# Patient Record
Sex: Female | Born: 1970 | Race: Asian | Hispanic: No | Smoking: Never smoker
Health system: Southern US, Community
[De-identification: ages and names within clinical notes are randomized; demographics above are authoritative.]

## PROBLEM LIST (undated history)

## (undated) DIAGNOSIS — E079 Disorder of thyroid, unspecified: Secondary | ICD-10-CM

## (undated) DIAGNOSIS — B009 Herpesviral infection, unspecified: Secondary | ICD-10-CM

## (undated) DIAGNOSIS — B360 Pityriasis versicolor: Secondary | ICD-10-CM

## (undated) DIAGNOSIS — M545 Low back pain: Secondary | ICD-10-CM

## (undated) DIAGNOSIS — R51 Headache: Secondary | ICD-10-CM

## (undated) DIAGNOSIS — N6019 Diffuse cystic mastopathy of unspecified breast: Secondary | ICD-10-CM

## (undated) DIAGNOSIS — L743 Miliaria, unspecified: Secondary | ICD-10-CM

## (undated) DIAGNOSIS — L659 Nonscarring hair loss, unspecified: Secondary | ICD-10-CM

## (undated) DIAGNOSIS — E871 Hypo-osmolality and hyponatremia: Secondary | ICD-10-CM

## (undated) DIAGNOSIS — Z0000 Encounter for general adult medical examination without abnormal findings: Secondary | ICD-10-CM

## (undated) DIAGNOSIS — O021 Missed abortion: Secondary | ICD-10-CM

## (undated) DIAGNOSIS — N760 Acute vaginitis: Secondary | ICD-10-CM

## (undated) DIAGNOSIS — N946 Dysmenorrhea, unspecified: Secondary | ICD-10-CM

## (undated) DIAGNOSIS — N809 Endometriosis, unspecified: Secondary | ICD-10-CM

## (undated) DIAGNOSIS — D649 Anemia, unspecified: Secondary | ICD-10-CM

## (undated) DIAGNOSIS — B019 Varicella without complication: Secondary | ICD-10-CM

## (undated) DIAGNOSIS — T7840XA Allergy, unspecified, initial encounter: Secondary | ICD-10-CM

## (undated) DIAGNOSIS — R591 Generalized enlarged lymph nodes: Secondary | ICD-10-CM

## (undated) HISTORY — DX: Miliaria, unspecified: L74.3

## (undated) HISTORY — DX: Nonscarring hair loss, unspecified: L65.9

## (undated) HISTORY — DX: Anemia, unspecified: D64.9

## (undated) HISTORY — DX: Acute vaginitis: N76.0

## (undated) HISTORY — DX: Encounter for general adult medical examination without abnormal findings: Z00.00

## (undated) HISTORY — DX: Allergy, unspecified, initial encounter: T78.40XA

## (undated) HISTORY — DX: Endometriosis, unspecified: N80.9

## (undated) HISTORY — DX: Low back pain: M54.5

## (undated) HISTORY — DX: Pityriasis versicolor: B36.0

## (undated) HISTORY — DX: Dysmenorrhea, unspecified: N94.6

## (undated) HISTORY — DX: Disorder of thyroid, unspecified: E07.9

## (undated) HISTORY — DX: Herpesviral infection, unspecified: B00.9

## (undated) HISTORY — DX: Varicella without complication: B01.9

## (undated) HISTORY — DX: Diffuse cystic mastopathy of unspecified breast: N60.19

## (undated) HISTORY — DX: Hypo-osmolality and hyponatremia: E87.1

## (undated) HISTORY — DX: Generalized enlarged lymph nodes: R59.1

---

## 2008-09-01 LAB — HM COLONOSCOPY

## 2010-01-20 LAB — HM MAMMOGRAPHY

## 2011-07-22 ENCOUNTER — Encounter: Payer: Self-pay | Admitting: Family Medicine

## 2011-07-22 ENCOUNTER — Ambulatory Visit (INDEPENDENT_AMBULATORY_CARE_PROVIDER_SITE_OTHER): Payer: PRIVATE HEALTH INSURANCE | Admitting: Family Medicine

## 2011-07-22 ENCOUNTER — Other Ambulatory Visit: Payer: Self-pay | Admitting: Family Medicine

## 2011-07-22 DIAGNOSIS — R5383 Other fatigue: Secondary | ICD-10-CM | POA: Insufficient documentation

## 2011-07-22 DIAGNOSIS — R5381 Other malaise: Secondary | ICD-10-CM

## 2011-07-22 DIAGNOSIS — R1310 Dysphagia, unspecified: Secondary | ICD-10-CM | POA: Insufficient documentation

## 2011-07-22 DIAGNOSIS — H9209 Otalgia, unspecified ear: Secondary | ICD-10-CM

## 2011-07-22 DIAGNOSIS — E559 Vitamin D deficiency, unspecified: Secondary | ICD-10-CM | POA: Insufficient documentation

## 2011-07-22 DIAGNOSIS — N6019 Diffuse cystic mastopathy of unspecified breast: Secondary | ICD-10-CM

## 2011-07-22 DIAGNOSIS — E875 Hyperkalemia: Secondary | ICD-10-CM

## 2011-07-22 DIAGNOSIS — E079 Disorder of thyroid, unspecified: Secondary | ICD-10-CM

## 2011-07-22 DIAGNOSIS — R1013 Epigastric pain: Secondary | ICD-10-CM

## 2011-07-22 DIAGNOSIS — Z309 Encounter for contraceptive management, unspecified: Secondary | ICD-10-CM

## 2011-07-22 HISTORY — DX: Diffuse cystic mastopathy of unspecified breast: N60.19

## 2011-07-22 HISTORY — DX: Disorder of thyroid, unspecified: E07.9

## 2011-07-22 NOTE — Patient Instructions (Addendum)

## 2011-07-23 LAB — HEPATIC FUNCTION PANEL
ALT: 13 U/L (ref 0–35)
AST: 20 U/L (ref 0–37)
Albumin: 4.7 g/dL (ref 3.5–5.2)
Alkaline Phosphatase: 47 U/L (ref 39–117)
Bilirubin, Direct: 0.1 mg/dL (ref 0.0–0.3)
Indirect Bilirubin: 0.5 mg/dL (ref 0.0–0.9)
Total Bilirubin: 0.6 mg/dL (ref 0.3–1.2)
Total Protein: 7.8 g/dL (ref 6.0–8.3)

## 2011-07-23 LAB — T4, FREE: Free T4: 1.19 ng/dL (ref 0.80–1.80)

## 2011-07-23 LAB — BASIC METABOLIC PANEL
BUN: 16 mg/dL (ref 6–23)
CO2: 25 mEq/L (ref 19–32)
Calcium: 9.7 mg/dL (ref 8.4–10.5)
Chloride: 101 mEq/L (ref 96–112)
Creat: 0.68 mg/dL (ref 0.50–1.10)
Glucose, Bld: 91 mg/dL (ref 70–99)
Potassium: 4.7 mEq/L (ref 3.5–5.3)
Sodium: 137 mEq/L (ref 135–145)

## 2011-07-23 LAB — CBC
HCT: 42.5 % (ref 36.0–46.0)
Hemoglobin: 14.6 g/dL (ref 12.0–15.0)
MCH: 31.8 pg (ref 26.0–34.0)
MCHC: 34.4 g/dL (ref 30.0–36.0)
MCV: 92.6 fL (ref 78.0–100.0)
Platelets: 281 10*3/uL (ref 150–400)
RBC: 4.59 MIL/uL (ref 3.87–5.11)
RDW: 12.2 % (ref 11.5–15.5)
WBC: 8.2 10*3/uL (ref 4.0–10.5)

## 2011-07-23 LAB — TSH: TSH: 2.228 u[IU]/mL (ref 0.350–4.500)

## 2011-07-23 LAB — PHOSPHORUS: Phosphorus: 3.3 mg/dL (ref 2.3–4.6)

## 2011-07-25 LAB — H. PYLORI ANTIBODY, IGG: H Pylori IgG: 0.76 {ISR}

## 2011-07-26 ENCOUNTER — Encounter: Payer: Self-pay | Admitting: Family Medicine

## 2011-07-26 DIAGNOSIS — H9209 Otalgia, unspecified ear: Secondary | ICD-10-CM | POA: Insufficient documentation

## 2011-07-26 DIAGNOSIS — Z309 Encounter for contraceptive management, unspecified: Secondary | ICD-10-CM | POA: Insufficient documentation

## 2011-07-26 LAB — VITAMIN D 1,25 DIHYDROXY
Vitamin D 1, 25 (OH)2 Total: 60 pg/mL (ref 18–72)
Vitamin D2 1, 25 (OH)2: <8 pg/mL
Vitamin D3 1, 25 (OH)2: 60 pg/mL

## 2011-07-26 NOTE — Assessment & Plan Note (Signed)
Minimize K in diet and monitor

## 2011-07-26 NOTE — Assessment & Plan Note (Addendum)
Will repeat thyroid labs. Brings in old labs which showed a TSH of 5.87

## 2011-07-26 NOTE — Assessment & Plan Note (Signed)
Patient reports a long history of irregular MGMs, tender breasts and some discharge was previously followed across the [place] and in the [place]. Would like to establish care her in [place] with a breast center.

## 2011-07-26 NOTE — Progress Notes (Signed)
Jackie [name] [id number] 1970-12-27 07/26/2011      Progress Note New Patient  Subjective  Chief Complaint  Chief Complaint  Patient presents with  . Establish Care    new patient    HPI  Patient is a 40 year old female who is in today to establish care. She is originally from [place] but has been living in the [place] since 2007. She had smoked extensively since moving here. She lived in [place] and [place]. She believes she is now in [place] for the duration. She has multiple complaints. So long history of breast tenderness and discharge from her breasts. Had some fibrocystic changes and has been followed intermittently by various stressors. Is trying to get old records and establish care here. Notes persistent fatigue and emotional lability. Does note a previous TSH elevated at 5.87. Has been struggling with some dyspepsia and dysphagia. Some flatulence and gaseousness is noted. No bloody or tarry stool. She has tried low-dose Zantac over-the-counter but she is unclear of the dosing. Denies sore throat. Declines flu shot. Did have a Tdap in 2011. When her dysphagia and her dyspepsia that she has mild chest discomfort as well. She also notes she has edema diffusely and sensation of pins and needles into her hands right greater than left during certain parts of her menstrual cycles. She has had carpal tunnel syndrome on the right in the past. She was struggling with pelvic pain and it was found that her IUD was improperly lodged. That has improved since that was removed. No fevers, chills, palpitations, shortness of breath. She was having some ear pain was seen by ENT and they ultimately felt like TMJ was the cause.  Past Medical History  Diagnosis Date  . Vitamin D deficiency 2012  . Chicken pox 40 yrs old  . Vitamin D deficiency 07/22/2011  . Fatigue 07/22/2011  . Thyroid dysfunction 07/22/2011  . Hyperkalemia 07/22/2011  . Dysphagia 07/22/2011  . Anemia     during pregnancy   . Fibrocystic breast 07/22/2011  . Ear pain 07/26/2011  . Contraceptive management 07/26/2011    History reviewed. No pertinent past surgical history.  Family History  Problem Relation Age of Onset  . Diabetes Mother     type 2  . Stroke Mother   . Angina Mother   . Osteoporosis Mother   . Heart disease Mother     angina  . Heart attack Father   . Diabetes Father     type 2  . Heart disease Father 54    MI  . Obesity Sister   . Diabetes Sister   . Obesity Brother   . Diabetes Brother     type 2  . Aneurysm Maternal Grandmother   . Heart attack Maternal Grandfather   . Pneumonia Paternal Grandfather   . Hypertension Sister     induced  . Other Sister     anemic  . Anemia Sister   . Depression Sister     History   Social History  . Marital Status: Married    Spouse Name: N/A    Number of Children: N/A  . Years of Education: N/A   Occupational History  . Not on file.   Social History Main Topics  . Smoking status: Never Smoker   . Smokeless tobacco: Never Used  . Alcohol Use: Yes     1 to 2 glasses 5 times a week. but then the next week maybe nothing  . Drug Use: No  . Sexually  Active: Yes -- Female partner(s)   Other Topics Concern  . Not on file   Social History Narrative  . No narrative on file    No current outpatient prescriptions on file prior to visit.    No Known Allergies  Review of Systems  Review of Systems  Constitutional: Negative for fever, chills and malaise/fatigue.  HENT: Negative for hearing loss, nosebleeds, congestion and sore throat.   Eyes: Negative for discharge.  Respiratory: Negative for cough, sputum production, shortness of breath and wheezing.   Cardiovascular: Positive for chest pain. Negative for palpitations and leg swelling.  Gastrointestinal: Positive for heartburn. Negative for nausea, vomiting, abdominal pain, diarrhea, constipation and blood in stool.  Genitourinary: Negative for dysuria, urgency, frequency and  hematuria.  Musculoskeletal: Negative for myalgias, back pain and falls.  Skin: Negative for rash.  Neurological: Positive for sensory change. Negative for dizziness, tremors, focal weakness, loss of consciousness, weakness and headaches.       Right arm feels heavy at times  Endo/Heme/Allergies: Negative for polydipsia. Does not bruise/bleed easily.  Psychiatric/Behavioral: Negative for depression and suicidal ideas. The patient is not nervous/anxious and does not have insomnia.     Objective  BP 104/71  Pulse 75  Temp(Src) 97.9 F (36.6 C) (Oral)  Ht 5' 1.75" (1.568 m)  Wt 134 lb 1.9 oz (60.836 kg)  BMI 24.73 kg/m2  SpO2 95%  LMP 06/24/2011  Physical Exam  Physical Exam  Constitutional: She is oriented to person, place, and time and well-developed, well-nourished, and in no distress. No distress.  HENT:  Head: Normocephalic and atraumatic.  Right Ear: External ear normal.  Left Ear: External ear normal.  Nose: Nose normal.  Mouth/Throat: Oropharynx is clear and moist. No oropharyngeal exudate.  Eyes: Conjunctivae are normal. Pupils are equal, round, and reactive to light. Right eye exhibits no discharge. Left eye exhibits no discharge. No scleral icterus.  Neck: Normal range of motion. Neck supple. No thyromegaly present.  Cardiovascular: Normal rate, regular rhythm, normal heart sounds and intact distal pulses.   No murmur heard. Pulmonary/Chest: Effort normal and breath sounds normal. No respiratory distress. She has no wheezes. She has no rales.  Abdominal: Soft. Bowel sounds are normal. She exhibits no distension and no mass. There is no tenderness.  Musculoskeletal: Normal range of motion. She exhibits no edema and no tenderness.  Lymphadenopathy:    She has no cervical adenopathy.  Neurological: She is alert and oriented to person, place, and time. She has normal reflexes. No cranial nerve deficit. Coordination normal.  Skin: Skin is warm and dry. No rash noted. She  is not diaphoretic.  Psychiatric: Mood, memory and affect normal.       Assessment & Plan  Fibrocystic breast Patient reports a long history of irregular MGMs, tender breasts and some discharge was previously followed across the [place] and in the [place]. Would like to establish care her in [place] with a breast center.   Fatigue Agrees to fasting labs and we will request old records.   Ear pain Notes intermittent symptoms of left ear pain. Has been seen by ENT and hearing test was normal. They were suggesting she possibly had TMJ.  Thyroid dysfunction Will repeat thyroid labs. Brings in old labs which showed a TSH of 5.87  Hyperkalemia Minimize K in diet and monitor  Vitamin D deficiency Will monitor intermittently  Contraceptive management Previously used IUD but her last one in 2010 became imbedded wrong and had to be removed, they are  considering a vasectomy for her husband and if not she may need to tubal ligation.   Dysphagia Has been dealing with dyspepsia and has tried some OTC Zantac but is unsure of dosing tried, avoid offending foods and increase Zantac.

## 2011-07-26 NOTE — Assessment & Plan Note (Signed)
Has been dealing with dyspepsia and has tried some OTC Zantac but is unsure of dosing tried, avoid offending foods and increase Zantac.

## 2011-07-26 NOTE — Assessment & Plan Note (Signed)
Previously used IUD but her last one in 2010 became imbedded wrong and had to be removed, they are considering a vasectomy for her husband and if not she may need to tubal ligation.

## 2011-07-26 NOTE — Assessment & Plan Note (Signed)
Notes intermittent symptoms of left ear pain. Has been seen by ENT and hearing test was normal. They were suggesting she possibly had TMJ.

## 2011-07-26 NOTE — Assessment & Plan Note (Signed)
Will monitor intermittently

## 2011-07-26 NOTE — Assessment & Plan Note (Signed)
Agrees to fasting labs and we will request old records.

## 2011-08-01 ENCOUNTER — Telehealth: Payer: Self-pay

## 2011-08-01 NOTE — Telephone Encounter (Signed)
Pt called stating she would like a mammogram referral to [facility]? Pt also states she is getting chest palpitation? Discussed with md and an appt has been made for [facility] 08-03-11

## 2011-08-03 ENCOUNTER — Encounter: Payer: Self-pay | Admitting: Family Medicine

## 2011-08-03 ENCOUNTER — Ambulatory Visit (INDEPENDENT_AMBULATORY_CARE_PROVIDER_SITE_OTHER): Payer: PRIVATE HEALTH INSURANCE | Admitting: Family Medicine

## 2011-08-03 DIAGNOSIS — R0789 Other chest pain: Secondary | ICD-10-CM

## 2011-08-03 DIAGNOSIS — M542 Cervicalgia: Secondary | ICD-10-CM

## 2011-08-03 DIAGNOSIS — R0602 Shortness of breath: Secondary | ICD-10-CM

## 2011-08-03 DIAGNOSIS — N644 Mastodynia: Secondary | ICD-10-CM

## 2011-08-03 DIAGNOSIS — R002 Palpitations: Secondary | ICD-10-CM

## 2011-08-03 MED ORDER — CYCLOBENZAPRINE HCL 10 MG PO TABS: 10.0000 mg | ORAL_TABLET | Freq: Three times a day (TID) | ORAL | Status: DC | PRN

## 2011-08-03 NOTE — Assessment & Plan Note (Addendum)
Left breast pain and chest wall pain has been constant for about 2 weeks, has had a history of galactorrhea in the past but no recent increase. She is over due for a MGM even for screening purposes. Will proceed with MGM and consider [place] as needed. Has had to take a couple courses of Plan B recently. She notes the pain on the left is sometimes reproducible with palpation.

## 2011-08-03 NOTE — Patient Instructions (Signed)
Back Pain, Adult Low back pain is very common. About 1 in 5 people have back pain.The cause of low back pain is rarely dangerous. The pain often gets better over time.About half of people with a sudden onset of back pain feel better in just 2 weeks. About 8 in 10 people feel better by 6 weeks.  CAUSES Some common causes of back pain include:  Strain of the muscles or ligaments supporting the spine.   Wear and tear (degeneration) of the spinal discs.   Arthritis.   Direct injury to the back.  DIAGNOSIS Most of the time, the direct cause of low back pain is not known.However, back pain can be treated effectively even when the exact cause of the pain is unknown.Answering your caregiver's questions about your overall health and symptoms is one of the most accurate ways to make sure the cause of your pain is not dangerous. If your caregiver needs more information, he or she may order lab work or imaging tests (X-rays or MRIs).However, even if imaging tests show changes in your back, this usually does not require surgery. HOME CARE INSTRUCTIONS For many people, back pain returns.Since low back pain is rarely dangerous, it is often a condition that people can learn to manageon their own.   Remain active. It is stressful on the back to sit or stand in one place. Do not sit, drive, or stand in one place for more than 30 minutes at a time. Take short walks on level surfaces as soon as pain allows.Try to increase the length of time you walk each day.   Do not stay in bed.Resting more than 1 or 2 days can delay your recovery.   Do not avoid exercise or work.Your body is made to move.It is not dangerous to be active, even though your back may hurt.Your back will likely heal faster if you return to being active before your pain is gone.   Pay attention to your body when you bend and lift. Many people have less discomfortwhen lifting if they bend their knees, keep the load close to their  bodies,and avoid twisting. Often, the most comfortable positions are those that put less stress on your recovering back.   Find a comfortable position to sleep. Use a firm mattress and lie on your side with your knees slightly bent. If you lie on your back, put a pillow under your knees.   Only take over-the-counter or prescription medicines as directed by your caregiver. Over-the-counter medicines to reduce pain and inflammation are often the most helpful.Your caregiver may prescribe muscle relaxant drugs.These medicines help dull your pain so you can more quickly return to your normal activities and healthy exercise.   Put ice on the injured area.   Put ice in a plastic bag.   Place a towel between your skin and the bag.   Leave the ice on for 15 to 20 minutes, 3 to 4 times a day for the first 2 to 3 days. After that, ice and heat may be alternated to reduce pain and spasms.   Ask your caregiver about trying back exercises and gentle massage. This may be of some benefit.   Avoid feeling anxious or stressed.Stress increases muscle tension and can worsen back pain.It is important to recognize when you are anxious or stressed and learn ways to manage it.Exercise is a great option.  SEEK MEDICAL CARE IF:  You have pain that is not relieved with rest or medicine.   You have   pain that does not improve in 1 week.   You have new symptoms.   You are generally not feeling well.  SEEK IMMEDIATE MEDICAL CARE IF:   You have pain that radiates from your back into your legs.   You develop new bowel or bladder control problems.   You have unusual weakness or numbness in your arms or legs.   You develop nausea or vomiting.   You develop abdominal pain.   You feel faint.  Document Released: 09/05/2005 Document Revised: 05/18/2011 Document Reviewed: 01/24/2011 [facility] Patient Information 2012 [place], [place].Costochondritis Costochondritis (Tietze syndrome), or costochondral  separation, is a swelling and irritation (inflammation) of the tissue (cartilage) that connects your ribs with your breastbone (sternum). It may occur on its own (spontaneously), through damage caused by an accident (trauma), or simply from coughing or minor exercise. It may take up to 6 weeks to get better and longer if you are unable to be conservative in your activities. HOME CARE INSTRUCTIONS   Avoid exhausting physical activity. Try not to strain your ribs during normal activity. This would include any activities using chest, belly (abdominal), and side muscles, especially if heavy weights are used.   Use ice for 15 to 20 minutes per hour while awake for the first 2 days. Place the ice in a plastic bag, and place a towel between the bag of ice and your skin.   Only take over-the-counter or prescription medicines for pain, discomfort, or fever as directed by your caregiver.  SEEK IMMEDIATE MEDICAL CARE IF:   Your pain increases or you are very uncomfortable.   You have a fever.   You develop difficulty with your breathing.   You cough up blood.   You develop worse chest pains, shortness of breath, sweating, or vomiting.   You develop new, unexplained problems (symptoms).  MAKE SURE YOU:   Understand these instructions.   Will watch your condition.   Will get help right away if you are not doing well or get worse.  Document Released: 06/15/2005 Document Revised: 05/18/2011 Document Reviewed: 04/23/2008 [facility] Patient Information 2012 [place], [place].  Apply moist heat and attempt gentle stretching to the neck and shoulders each night. Rub down with Aspercreme and consider a massage or Chiropractor if pain persists

## 2011-08-05 ENCOUNTER — Encounter: Payer: Self-pay | Admitting: Family Medicine

## 2011-08-05 DIAGNOSIS — R002 Palpitations: Secondary | ICD-10-CM | POA: Insufficient documentation

## 2011-08-05 NOTE — Progress Notes (Signed)
Jackie [name] [id number] Jun 11, 1971 08/05/2011      Progress Note-Follow Up  Subjective  Chief Complaint  Chief Complaint  Patient presents with  . multiple issues    HPI  Patient is a 40 yo female in today with multiple c/o. She has along history of breast pain, discharge and fibrocystic breasts  Complaining of worsening pain in the left breast recently. She is also complaining of left anterior chest wall pain that is often reproducible with palpation. No fall or trauma. She has been under a great deal of stress and is also noting some left sided neck spasm/pain and left shoulder pain. Also notes worsening fatigue and SOB with exertion which is steadily worsening. No recent febrile illness, congestion, cough, syncope, GI or GU c/o  Past Medical History  Diagnosis Date  . Vitamin D deficiency 2012  . Chicken pox 40 yrs old  . Vitamin D deficiency 07/22/2011  . Fatigue 07/22/2011  . Thyroid dysfunction 07/22/2011  . Hyperkalemia 07/22/2011  . Dysphagia 07/22/2011  . Anemia     during pregnancy  . Fibrocystic breast 07/22/2011  . Ear pain 07/26/2011  . Contraceptive management 07/26/2011  . Breast pain 08/03/2011  . Neck pain, musculoskeletal 08/03/2011  . Palpitations 08/05/2011    History reviewed. No pertinent past surgical history.  Family History  Problem Relation Age of Onset  . Diabetes Mother     type 2  . Stroke Mother   . Angina Mother   . Osteoporosis Mother   . Heart disease Mother     angina  . Heart attack Father   . Diabetes Father     type 2  . Heart disease Father 42    MI  . Obesity Sister   . Diabetes Sister   . Obesity Brother   . Diabetes Brother     type 2  . Aneurysm Maternal Grandmother   . Heart attack Maternal Grandfather   . Pneumonia Paternal Grandfather   . Hypertension Sister     induced  . Other Sister     anemic  . Anemia Sister   . Depression Sister     History   Social History  . Marital Status: Married    Spouse Name:  N/A    Number of Children: N/A  . Years of Education: N/A   Occupational History  . Not on file.   Social History Main Topics  . Smoking status: Never Smoker   . Smokeless tobacco: Never Used  . Alcohol Use: Yes     1 to 2 glasses 5 times a week. but then the next week maybe nothing  . Drug Use: No  . Sexually Active: Yes -- Female partner(s)   Other Topics Concern  . Not on file   Social History Narrative  . No narrative on file    Current Outpatient Prescriptions on File Prior to Visit  Medication Sig Dispense Refill  . Cholecalciferol (VITAMIN D) 1000 UNITS capsule Take 2,500 Units by mouth daily.        . Multiple Vitamin (MULTIVITAMIN) tablet Take 1 tablet by mouth daily.          No Known Allergies  Review of Systems  Review of Systems  Constitutional: Negative for fever and malaise/fatigue.  HENT: Negative for congestion.   Eyes: Negative for discharge.  Respiratory: Positive for shortness of breath.   Cardiovascular: Positive for chest pain and palpitations. Negative for leg swelling.  Gastrointestinal: Negative for nausea, abdominal pain and diarrhea.  Genitourinary: Negative for dysuria.  Musculoskeletal: Negative for falls.  Skin: Negative for rash.  Neurological: Negative for loss of consciousness and headaches.  Endo/Heme/Allergies: Negative for polydipsia.  Psychiatric/Behavioral: Negative for depression and suicidal ideas. The patient is nervous/anxious. The patient does not have insomnia.     Objective  BP 119/81  Pulse 73  Temp(Src) 97.8 F (36.6 C) (Oral)  Ht 5' 1.75" (1.568 m)  Wt 136 lb (61.689 kg)  BMI 25.08 kg/m2  SpO2 98%  LMP 06/24/2011  Physical Exam  Physical Exam  Constitutional: She is oriented to person, place, and time and well-developed, well-nourished, and in no distress. No distress.  HENT:  Head: Normocephalic and atraumatic.  Right Ear: External ear normal.  Left Ear: External ear normal.  Nose: Nose normal.    Mouth/Throat: Oropharynx is clear and moist. No oropharyngeal exudate.  Eyes: Conjunctivae are normal. Pupils are equal, round, and reactive to light. Right eye exhibits no discharge. Left eye exhibits no discharge. No scleral icterus.  Neck: Normal range of motion. Neck supple. No thyromegaly present.  Cardiovascular: Normal rate, regular rhythm, normal heart sounds and intact distal pulses.   No murmur heard. Pulmonary/Chest: Effort normal and breath sounds normal. No respiratory distress. She has no wheezes. She has no rales.  Abdominal: Soft. Bowel sounds are normal. She exhibits no distension and no mass. There is no tenderness.  Genitourinary:       Breast exam, fibrocystic b/l but no concerning lesions palpated, no breast discharge or skin changes  Musculoskeletal: Normal range of motion. She exhibits no edema and no tenderness.  Lymphadenopathy:    She has no cervical adenopathy.  Neurological: She is alert and oriented to person, place, and time. She has normal reflexes. No cranial nerve deficit. Coordination normal.  Skin: Skin is warm and dry. No rash noted. She is not diaphoretic.  Psychiatric: Mood, memory and affect normal.    Lab Results  Component Value Date   TSH 2.228 07/22/2011   Lab Results  Component Value Date   WBC 8.2 07/22/2011   HGB 14.6 07/22/2011   HCT 42.5 07/22/2011   MCV 92.6 07/22/2011   PLT 281 07/22/2011   Lab Results  Component Value Date   CREATININE 0.68 07/22/2011   BUN 16 07/22/2011   NA 137 07/22/2011   K 4.7 07/22/2011   CL 101 07/22/2011   CO2 25 07/22/2011   Lab Results  Component Value Date   ALT 13 07/22/2011   AST 20 07/22/2011   ALKPHOS 47 07/22/2011   BILITOT 0.6 07/22/2011    Assessment & Plan  Breast pain Left breast pain and chest wall pain has been constant for about 2 weeks, has had a history of galactorrhea in the past but no recent increase. She is over due for a MGM even for screening purposes. Will proceed with MGM and  consider [place] as needed. Has had to take a couple courses of Plan B recently. She notes the pain on the left is sometimes reproducible with palpation.   Neck pain, musculoskeletal Neck pain and left upper anterior chest wall pain are present suggestive of musculoskeletal pain at times, unfortunately cannot fully rule out other causes. For the musculoskeletal component she is encouraged to apply moist heat and perform gentle stretching and she is given Cyclobenzaprine to use as needed   Palpitations Patient c/o intermittent episodes of palpitations, SOB and chest pain. We will proceed with 2d echo to further evaluate

## 2011-08-05 NOTE — Assessment & Plan Note (Signed)
Neck pain and left upper anterior chest wall pain are present suggestive of musculoskeletal pain at times, unfortunately cannot fully rule out other causes. For the musculoskeletal component she is encouraged to apply moist heat and perform gentle stretching and she is given Cyclobenzaprine to use as needed

## 2011-08-05 NOTE — Assessment & Plan Note (Signed)
Patient c/o intermittent episodes of palpitations, SOB and chest pain. We will proceed with 2d echo to further evaluate

## 2011-08-09 ENCOUNTER — Telehealth: Payer: Self-pay | Admitting: Family Medicine

## 2011-08-09 NOTE — Telephone Encounter (Signed)
Patient informed that being "sick" is not going to affect her labwork

## 2011-08-09 NOTE — Telephone Encounter (Signed)
Patient would like to come in for bloodwork tomorrow & wants to know if she is congested and a little "lightheaded" will that affect her results?

## 2011-08-15 ENCOUNTER — Other Ambulatory Visit (HOSPITAL_COMMUNITY): Payer: PRIVATE HEALTH INSURANCE | Admitting: Radiology

## 2011-08-16 ENCOUNTER — Other Ambulatory Visit (INDEPENDENT_AMBULATORY_CARE_PROVIDER_SITE_OTHER): Payer: PRIVATE HEALTH INSURANCE

## 2011-08-16 DIAGNOSIS — E785 Hyperlipidemia, unspecified: Secondary | ICD-10-CM

## 2011-08-16 LAB — LIPID PANEL
Cholesterol: 213 mg/dL — ABNORMAL HIGH (ref 0–200)
HDL: 83.2 mg/dL (ref >39.00)
Total CHOL/HDL Ratio: 3
Triglycerides: 103 mg/dL (ref 0.0–149.0)
VLDL: 20.6 mg/dL (ref 0.0–40.0)

## 2011-08-16 LAB — LDL CHOLESTEROL, DIRECT: Direct LDL: 119.4 mg/dL

## 2011-08-19 ENCOUNTER — Other Ambulatory Visit: Payer: Self-pay | Admitting: Family Medicine

## 2011-08-19 ENCOUNTER — Ambulatory Visit
Admission: RE | Admit: 2011-08-19 | Discharge: 2011-08-19 | Disposition: A | Discharge status: Home / Self Care | Payer: PRIVATE HEALTH INSURANCE | Source: Ambulatory Visit | Attending: Family Medicine | Admitting: Family Medicine

## 2011-08-19 DIAGNOSIS — N644 Mastodynia: Secondary | ICD-10-CM

## 2011-08-22 ENCOUNTER — Ambulatory Visit: Payer: PRIVATE HEALTH INSURANCE | Admitting: Family Medicine

## 2011-08-22 ENCOUNTER — Ambulatory Visit (HOSPITAL_COMMUNITY): Payer: No Typology Code available for payment source | Attending: Cardiology | Admitting: Radiology

## 2011-08-22 DIAGNOSIS — R0602 Shortness of breath: Secondary | ICD-10-CM

## 2011-08-22 DIAGNOSIS — R079 Chest pain, unspecified: Secondary | ICD-10-CM | POA: Insufficient documentation

## 2011-08-22 DIAGNOSIS — R002 Palpitations: Secondary | ICD-10-CM

## 2011-08-22 DIAGNOSIS — I079 Rheumatic tricuspid valve disease, unspecified: Secondary | ICD-10-CM | POA: Insufficient documentation

## 2011-08-22 DIAGNOSIS — I059 Rheumatic mitral valve disease, unspecified: Secondary | ICD-10-CM | POA: Insufficient documentation

## 2011-08-22 DIAGNOSIS — R0789 Other chest pain: Secondary | ICD-10-CM

## 2011-08-22 DIAGNOSIS — Z8249 Family history of ischemic heart disease and other diseases of the circulatory system: Secondary | ICD-10-CM | POA: Insufficient documentation

## 2011-08-24 ENCOUNTER — Ambulatory Visit (INDEPENDENT_AMBULATORY_CARE_PROVIDER_SITE_OTHER): Payer: PRIVATE HEALTH INSURANCE | Admitting: Family Medicine

## 2011-08-24 ENCOUNTER — Ambulatory Visit (HOSPITAL_BASED_OUTPATIENT_CLINIC_OR_DEPARTMENT_OTHER)
Admission: RE | Admit: 2011-08-24 | Discharge: 2011-08-24 | Disposition: A | Discharge status: Home / Self Care | Payer: No Typology Code available for payment source | Source: Ambulatory Visit | Attending: Family Medicine | Admitting: Family Medicine

## 2011-08-24 ENCOUNTER — Encounter: Payer: Self-pay | Admitting: Family Medicine

## 2011-08-24 DIAGNOSIS — N644 Mastodynia: Secondary | ICD-10-CM

## 2011-08-24 DIAGNOSIS — E785 Hyperlipidemia, unspecified: Secondary | ICD-10-CM

## 2011-08-24 DIAGNOSIS — R002 Palpitations: Secondary | ICD-10-CM

## 2011-08-24 DIAGNOSIS — R079 Chest pain, unspecified: Secondary | ICD-10-CM

## 2011-08-24 DIAGNOSIS — R091 Pleurisy: Secondary | ICD-10-CM

## 2011-08-24 DIAGNOSIS — R040 Epistaxis: Secondary | ICD-10-CM

## 2011-08-24 DIAGNOSIS — R0789 Other chest pain: Secondary | ICD-10-CM

## 2011-08-24 DIAGNOSIS — R05 Cough: Secondary | ICD-10-CM

## 2011-08-24 DIAGNOSIS — R059 Cough, unspecified: Secondary | ICD-10-CM | POA: Insufficient documentation

## 2011-08-24 MED ORDER — ASPIRIN EC 81 MG PO TBEC: 81.0000 mg | DELAYED_RELEASE_TABLET | Freq: Every day | ORAL | Status: DC

## 2011-08-24 MED ORDER — BACITRACIN ZINC 500 UNIT/GM EX OINT: TOPICAL_OINTMENT | CUTANEOUS | Status: DC

## 2011-08-24 MED ORDER — NITROGLYCERIN 0.4 MG SL SUBL: 0.4000 mg | SUBLINGUAL_TABLET | SUBLINGUAL | Status: DC | PRN

## 2011-08-24 MED ORDER — KRILL OIL PO CAPS: 1.0000 | ORAL_CAPSULE | Freq: Every day | ORAL | Status: DC

## 2011-08-24 NOTE — Patient Instructions (Signed)
Pleurisy Pleurisy is an inflammation and swelling of the lining of the lungs. It usually is the result of an underlying infection or other disease. Because of this inflammation, it hurts to breathe. It is aggravated by coughing or deep breathing. The primary goal in treating pleurisy is to diagnose and treat the condition that caused it.  HOME CARE INSTRUCTIONS   Only take over-the-counter or prescription medicines for pain, discomfort, or fever as directed by your caregiver.   If medications which kill germs (antibiotics) were prescribed, take the entire course. Even if you are feeling better, you need to take them.   Use a cool mist vaporizer to help loosen secretions. This is so the secretions can be coughed up more easily.  SEEK MEDICAL CARE IF:   Your pain is not controlled with medication or is increasing.   You have an increase inpus like (purulent) secretions brought up with coughing.  SEEK IMMEDIATE MEDICAL CARE IF:   You have blue or dark lips, fingernails, or toenails.   You begin coughing up blood.   You have increased difficulty breathing.   You have continuing pain unrelieved by medicine or lasting more than 1 week.   You have pain that radiates into your neck, arms, or jaw.   You develop increased shortness of breath or wheezing.   You develop a fever, rash, vomiting, fainting, or other serious complaints.  Document Released: 09/05/2005 Document Revised: 05/18/2011 Document Reviewed: 04/06/2007 [facility] Patient Information 2012 [place], [place].Bronchitis Bronchitis is the body's way of reacting to injury and/or infection (inflammation) of the bronchi. Bronchi are the air tubes that extend from the windpipe into the lungs. If the inflammation becomes severe, it may cause shortness of breath. CAUSES  Inflammation may be caused by:  A virus.   Germs (bacteria).   Dust.   Allergens.   Pollutants and many other irritants.  The cells lining the bronchial tree are  covered with tiny hairs (cilia). These constantly beat upward, away from the lungs, toward the mouth. This keeps the lungs free of pollutants. When these cells become too irritated and are unable to do their job, mucus begins to develop. This causes the characteristic cough of bronchitis. The cough clears the lungs when the cilia are unable to do their job. Without either of these protective mechanisms, the mucus would settle in the lungs. Then you would develop pneumonia. Smoking is a common cause of bronchitis and can contribute to pneumonia. Stopping this habit is the single most important thing you can do to help yourself. TREATMENT   Your caregiver may prescribe an antibiotic if the cough is caused by bacteria. Also, medicines that open up your airways make it easier to breathe. Your caregiver may also recommend or prescribe an expectorant. It will loosen the mucus to be coughed up. Only take over-the-counter or prescription medicines for pain, discomfort, or fever as directed by your caregiver.   Removing whatever causes the problem (smoking, for example) is critical to preventing the problem from getting worse.   Cough suppressants may be prescribed for relief of cough symptoms.   Inhaled medicines may be prescribed to help with symptoms now and to help prevent problems from returning.   For those with recurrent (chronic) bronchitis, there may be a need for steroid medicines.  SEEK IMMEDIATE MEDICAL CARE IF:   During treatment, you develop more pus-like mucus (purulent sputum).   You have a fever.   Your baby is older than 3 months with a rectal temperature  of 102 F (38.9 C) or higher.   Your baby is 40 months old or younger with a rectal temperature of 100.4 F (38 C) or higher.   You become progressively more ill.   You have increased difficulty breathing, wheezing, or shortness of breath.  It is necessary to seek immediate medical care if you are elderly or sick from any other  disease. MAKE SURE YOU:   Understand these instructions.   Will watch your condition.   Will get help right away if you are not doing well or get worse.  Document Released: 09/05/2005 Document Revised: 05/18/2011 Document Reviewed: 07/15/2008 [facility] Patient Information 2012 [place], [place].

## 2011-08-24 NOTE — Progress Notes (Signed)
Jackie [name] [id number] 1970-11-09 08/24/2011      Progress Note-Follow Up  Subjective  Chief Complaint  Chief Complaint  Patient presents with  . Follow-up    on Echo and mammogram results, nose bleeds, pain under breast    HPI  Patient is a 40 year old Caucasian female who is here today in followup of her echo results. These are reviewed with her. She continues to have palpitations although she says to a lesser degree. She notes her breast pain discharge or slightly improved as well. She is noting however over the last week she's had URI symptoms. Fatigue, malaise, myalgias, fevers, chills, congestion, anorexia and a slight sense of nausea or wooziness. Cough is often productive of initially green phlegm but now clear and she does note a mild headache. The URI symptoms are improving. Her rhinorrhea is now clear her cough congestion and anorexia are improving. Unf anterior chest wall pain however. It is worse with palpation and reproducible. She described it as sometimes sharp like a knife and other times a squeezing or weight on her chest sensation. The other symptoms are sometimes associated with these pains and other times notortunately she's complaining of  Past Medical History  Diagnosis Date  . Vitamin D deficiency 2012  . Chicken pox 39 yrs old  . Vitamin D deficiency 07/22/2011  . Fatigue 07/22/2011  . Thyroid dysfunction 07/22/2011  . Hyperkalemia 07/22/2011  . Dysphagia 07/22/2011  . Anemia     during pregnancy  . Fibrocystic breast 07/22/2011  . Ear pain 07/26/2011  . Contraceptive management 07/26/2011  . Breast pain 08/03/2011  . Neck pain, musculoskeletal 08/03/2011  . Palpitations 08/05/2011  . Chest pain, atypical 08/24/2011  . Epistaxis 08/24/2011    History reviewed. No pertinent past surgical history.  Family History  Problem Relation Age of Onset  . Diabetes Mother     type 2  . Stroke Mother   . Angina Mother   . Osteoporosis Mother   . Heart disease  Mother     angina  . Heart attack Father   . Diabetes Father     type 2  . Heart disease Father 19    MI  . Obesity Sister   . Diabetes Sister   . Obesity Brother   . Diabetes Brother     type 2  . Aneurysm Maternal Grandmother   . Heart attack Maternal Grandfather   . Pneumonia Paternal Grandfather   . Hypertension Sister     induced  . Other Sister     anemic  . Anemia Sister   . Depression Sister     History   Social History  . Marital Status: Married    Spouse Name: N/A    Number of Children: N/A  . Years of Education: N/A   Occupational History  . Not on file.   Social History Main Topics  . Smoking status: Never Smoker   . Smokeless tobacco: Never Used  . Alcohol Use: Yes     1 to 2 glasses 5 times a week. but then the next week maybe nothing  . Drug Use: No  . Sexually Active: Yes -- Female partner(s)   Other Topics Concern  . Not on file   Social History Narrative  . No narrative on file    Current Outpatient Prescriptions on File Prior to Visit  Medication Sig Dispense Refill  . Cholecalciferol (VITAMIN D) 1000 UNITS capsule Take 2,500 Units by mouth daily.        Marland Kitchen  Multiple Vitamin (MULTIVITAMIN) tablet Take 1 tablet by mouth daily.        . cyclobenzaprine (FLEXERIL) 10 MG tablet Take 1 tablet (10 mg total) by mouth every 8 (eight) hours as needed for muscle spasms.  30 tablet  1    No Known Allergies  Review of Systems  Review of Systems  Constitutional: Positive for fever, chills and malaise/fatigue.  HENT: Positive for congestion.   Eyes: Negative for discharge.  Respiratory: Positive for cough, hemoptysis and shortness of breath. Negative for wheezing.   Cardiovascular: Positive for chest pain and palpitations. Negative for leg swelling.  Gastrointestinal: Positive for nausea. Negative for abdominal pain and diarrhea.  Genitourinary: Negative for dysuria.  Musculoskeletal: Positive for myalgias. Negative for falls.  Skin: Negative for  rash.  Neurological: Positive for dizziness. Negative for seizures, loss of consciousness and headaches.  Endo/Heme/Allergies: Negative for polydipsia.  Psychiatric/Behavioral: Negative for depression and suicidal ideas. The patient is not nervous/anxious and does not have insomnia.     Objective  BP 109/74  Pulse 90  Temp(Src) 98.1 F (36.7 C) (Oral)  Ht 5' 1.75" (1.568 m)  Wt 133 lb 12.8 oz (60.691 kg)  BMI 24.67 kg/m2  SpO2 96%  LMP 08/18/2011  Physical Exam  Physical Exam  Constitutional: She is oriented to person, place, and time and well-developed, well-nourished, and in no distress. No distress.  HENT:  Head: Normocephalic and atraumatic.  Eyes: Conjunctivae are normal.  Neck: Neck supple. No thyromegaly present.  Cardiovascular: Normal rate, regular rhythm and normal heart sounds.   No murmur heard. Pulmonary/Chest: Effort normal and breath sounds normal. She has no wheezes.  Abdominal: She exhibits no distension and no mass.  Musculoskeletal: She exhibits no edema.  Lymphadenopathy:    She has no cervical adenopathy.  Neurological: She is alert and oriented to person, place, and time.  Skin: Skin is warm and dry. No rash noted. She is not diaphoretic.  Psychiatric: Memory, affect and judgment normal.    Lab Results  Component Value Date   TSH 2.228 07/22/2011   Lab Results  Component Value Date   WBC 8.2 07/22/2011   HGB 14.6 07/22/2011   HCT 42.5 07/22/2011   MCV 92.6 07/22/2011   PLT 281 07/22/2011   Lab Results  Component Value Date   CREATININE 0.68 07/22/2011   BUN 16 07/22/2011   NA 137 07/22/2011   K 4.7 07/22/2011   CL 101 07/22/2011   CO2 25 07/22/2011   Lab Results  Component Value Date   ALT 13 07/22/2011   AST 20 07/22/2011   ALKPHOS 47 07/22/2011   BILITOT 0.6 07/22/2011   Lab Results  Component Value Date   CHOL 213* 08/16/2011   Lab Results  Component Value Date   HDL 83.20 08/16/2011   No results found for this basename: LDLCALC    Lab Results  Component Value Date   TRIG 103.0 08/16/2011   Lab Results  Component Value Date   CHOLHDL 3 08/16/2011     Assessment & Plan  Epistaxis Try an   Chest pain, atypical She is anxious today complaining of some left-sided upper chest wall pain she describes as sometimes sharp and knife and other times a squeezing sensation. She reports over the last 48 hours having some episodes of feeling chest heaviness as well. She also has many other complaints some of which are consistent with concerns for cardiac but many of which are not. She feels woozy, nausea slightly not always  associated with the pain. Due to her persistent complaints and inability to have certainty regarding a cardiac cause. She is set up for cardiac consultation and possible further imaging. Chest x-ray is done today. She still doesn't want to listen and asked to take a baby aspirin until her evaluation is complete. EKG does not show any significant changes in  Breast pain Recent imaging was reassuring to patient and she reports the pain and discharge are improved.  Palpitations Still coming and going, less frequent. Avoid caffeine. Recent echo showed slight leaks in tricuspid and mitral valves only  Pleurisy Likely secondary to recent URI symptoms, URI symptoms resolving. Encouraged increased hydration, rest and Ibuprofen prn if symtpoms worsen or fevers develop she will notify our office for further evaluation

## 2011-08-24 NOTE — Assessment & Plan Note (Signed)
Try an

## 2011-08-29 ENCOUNTER — Encounter: Payer: Self-pay | Admitting: Family Medicine

## 2011-08-29 DIAGNOSIS — R091 Pleurisy: Secondary | ICD-10-CM | POA: Insufficient documentation

## 2011-08-29 NOTE — Assessment & Plan Note (Signed)
Recent imaging was reassuring to patient and she reports the pain and discharge are improved.

## 2011-08-29 NOTE — Assessment & Plan Note (Signed)
Likely secondary to recent URI symptoms, URI symptoms resolving. Encouraged increased hydration, rest and Ibuprofen prn if symtpoms worsen or fevers develop she will notify our office for further evaluation

## 2011-08-29 NOTE — Assessment & Plan Note (Addendum)
She is anxious today complaining of some left-sided upper chest wall pain she describes as sometimes sharp and knife and other times a squeezing sensation. She reports over the last 48 hours having some episodes of feeling chest heaviness as well. She also has many other complaints some of which are consistent with concerns for cardiac but many of which are not. She feels woozy, nausea slightly not always associated with the pain. Due to her persistent complaints and inability to have certainty regarding a cardiac cause. She is set up for cardiac consultation and possible further imaging. Chest x-ray is done today. She still doesn't want to listen and asked to take a baby aspirin until her evaluation is complete. EKG does not show any significant changes in

## 2011-08-29 NOTE — Assessment & Plan Note (Addendum)
Still coming and going, less frequent. Avoid caffeine. Recent echo showed slight leaks in tricuspid and mitral valves only

## 2011-09-01 ENCOUNTER — Ambulatory Visit (INDEPENDENT_AMBULATORY_CARE_PROVIDER_SITE_OTHER): Payer: PRIVATE HEALTH INSURANCE | Admitting: Cardiology

## 2011-09-01 ENCOUNTER — Encounter: Payer: Self-pay | Admitting: Cardiology

## 2011-09-01 DIAGNOSIS — R0789 Other chest pain: Secondary | ICD-10-CM

## 2011-09-01 DIAGNOSIS — E079 Disorder of thyroid, unspecified: Secondary | ICD-10-CM

## 2011-09-01 DIAGNOSIS — R079 Chest pain, unspecified: Secondary | ICD-10-CM

## 2011-09-01 NOTE — Assessment & Plan Note (Signed)
Symptoms atypical. They lasted approximately 5 seconds and resolved. She has had no symptoms recently. I doubt cardiac etiology. There is some tenderness to palpation in the chest area and symptoms could be musculoskeletal. We will not schedule further testing at this point. If her symptoms return we will consider an exercise treadmill. Patient in agreement.

## 2011-09-01 NOTE — Patient Instructions (Signed)
Your physician recommends that you schedule a follow-up appointment in: AS NEEDED  

## 2011-09-01 NOTE — Assessment & Plan Note (Signed)
Management per primary care. 

## 2011-09-01 NOTE — Progress Notes (Signed)
HPI: 40 yo female with no prior cardiac history for evaluation of chest pain. Chest xray 08/24/11 negative. Echo in Dec 2012 showed normal LV function with EF 50-55. TSH, Hgb and K in Nov 2012 normal. Patient states that in the spring and summer she had 2 episodes of chest pain. They were sudden in onset and substernal in location. They were described as a stabbing sensation followed by pressure. No associated symptoms. The pain was not pleuritic, positional or related to food. Duration 5 seconds and resolved. She has had no chest pain since then. In the spring she did have dyspnea on exertion but this has resolved. There is no orthopnea, PND, pedal edema or syncope. Because of the above we were asked to further evaluate.  Current Outpatient Prescriptions  Medication Sig Dispense Refill  . aspirin EC 81 MG tablet Take 1 tablet (81 mg total) by mouth daily.  30 tablet  2  . bacitracin ointment Apply small amount to b/l nares at bed x 7days  30 g  0  . Cholecalciferol (VITAMIN D) 1000 UNITS capsule Take 2,500 Units by mouth daily.        . cyclobenzaprine (FLEXERIL) 10 MG tablet Take 1 tablet (10 mg total) by mouth every 8 (eight) hours as needed for muscle spasms.  30 tablet  1  . Krill Oil CAPS Take 1 capsule by mouth daily.  30 capsule  3  . Multiple Vitamin (MULTIVITAMIN) tablet Take 1 tablet by mouth daily.        . nitroGLYCERIN (NITROSTAT) 0.4 MG SL tablet Place 1 tablet (0.4 mg total) under the tongue every 5 (five) minutes as needed for chest pain.  25 tablet  1    No Known Allergies  Past Medical History  Diagnosis Date  . Vitamin D deficiency 2012  . Chicken pox 40 yrs old  . Thyroid dysfunction 07/22/2011  . Anemia     during pregnancy  . Fibrocystic breast 07/22/2011  . Pleurisy 08/29/2011    No past surgical history on file.  History   Social History  . Marital Status: Married    Spouse Name: N/A    Number of Children: N/A  . Years of Education: N/A   Occupational  History  . Not on file.   Social History Main Topics  . Smoking status: Never Smoker   . Smokeless tobacco: Never Used  . Alcohol Use: Yes     1 to 2 glasses 5 times a week. but then the next week maybe nothing  . Drug Use: No  . Sexually Active: Yes -- Female partner(s)   Other Topics Concern  . Not on file   Social History Narrative  . No narrative on file    Family History  Problem Relation Age of Onset  . Diabetes Mother     type 2  . Stroke Mother   . Angina Mother   . Osteoporosis Mother   . Heart disease Mother     angina  . Heart attack Father   . Diabetes Father     type 2  . Heart disease Father 66    MI  . Obesity Sister   . Diabetes Sister   . Obesity Brother   . Diabetes Brother     type 2  . Aneurysm Maternal Grandmother   . Heart attack Maternal Grandfather   . Pneumonia Paternal Grandfather   . Hypertension Sister     induced  . Other Sister  anemic  . Anemia Sister   . Depression Sister     ROS: fatigue but no fevers or chills, productive cough, hemoptysis, dysphasia, odynophagia, melena, hematochezia, dysuria, hematuria, rash, seizure activity, orthopnea, PND, pedal edema, claudication. Remaining systems are negative.  Physical Exam: Height 5\' 2"  (1.575 m), weight 133 lb 12.8 oz (60.691 kg), last menstrual period 08/18/2011.  General:  Well developed/well nourished in NAD Skin warm/dry Patient not depressed No peripheral clubbing Back-normal HEENT-normal/normal eyelids Neck supple/normal carotid upstroke bilaterally; no bruits; no JVD; no thyromegaly chest - CTA/ normal expansion CV - RRR/normal S1 and S2; no murmurs, rubs or gallops;  PMI nondisplaced Abdomen -NT/ND, no HSM, no mass, + bowel sounds, no bruit 2+ femoral pulses, no bruits Ext-no edema, chords, 2+ DP Neuro-grossly nonfocal  ECG normal sinus rhythm at a rate of 73. Axis normal. No ST changes.

## 2011-10-20 ENCOUNTER — Encounter: Payer: Self-pay | Admitting: Family Medicine

## 2011-10-20 ENCOUNTER — Ambulatory Visit (INDEPENDENT_AMBULATORY_CARE_PROVIDER_SITE_OTHER): Payer: PRIVATE HEALTH INSURANCE | Admitting: Family Medicine

## 2011-10-20 DIAGNOSIS — N946 Dysmenorrhea, unspecified: Secondary | ICD-10-CM

## 2011-10-20 DIAGNOSIS — N83209 Unspecified ovarian cyst, unspecified side: Secondary | ICD-10-CM

## 2011-10-20 DIAGNOSIS — R140 Abdominal distension (gaseous): Secondary | ICD-10-CM

## 2011-10-20 DIAGNOSIS — N926 Irregular menstruation, unspecified: Secondary | ICD-10-CM

## 2011-10-20 HISTORY — DX: Dysmenorrhea, unspecified: N94.6

## 2011-10-20 NOTE — Assessment & Plan Note (Signed)
Patient in today concerned about ongoing menstrual irregularity, bloating and discomfort. She reports in the fall of 2012 she had a vaginal ultrasound which showed some right-sided ovarian cyst. It also showed that her IUD become implanted and inflamed on the left-hand side. At that time she been having left-sided abdominal pain the pain is essentially resolved. Unfortunately she's been having right-sided abdominal pain which is intermittent. She does intermittent bloating. She describes increased menstrual or irregularity. Sometimes passing clots sometimes passing frank red blood. Sometimes in small amounts in between periods and symmetric long extended painful periods. She is interested in a referral to share her options regarding treatment for her multiple concerns and further evaluation. This is arranged and we will see her in followup once this has been completed.

## 2011-10-20 NOTE — Patient Instructions (Signed)
Dysmenorrhea Menstrual pain is caused by the muscles of the uterus tightening (contracting) during a menstrual period. The muscles of the uterus contract due to the chemicals in the uterine lining. Primary dysmenorrhea is menstrual cramps that last a couple of days when you start having menstrual periods or soon after. This often begins after a teenager starts having her period. As a woman gets older or has a baby, the cramps will usually lesson or disappear. Secondary dysmenorrhea begins later in life, lasts longer, and the pain may be stronger than primary dysmenorrhea. The pain may start before the period and last a few days after the period. This type of dysmenorrhea is usually caused by an underlying problem such as:  The tissue lining the uterus grows outside of the uterus in other areas of the body (endometriosis).   The endometrial tissue, which normally lines the uterus, is found in or grows into the muscular walls of the uterus (adenomyosis).   The pelvic blood vessels are engorged with blood just before the menstrual period (pelvic congestive syndrome).   Overgrowth of cells in the lining of the uterus or cervix (polyps of the uterus or cervix).   Falling down of the uterus (prolapse) because of loose or stretched ligaments.   Depression.   Bladder problems, infection, or inflammation.   Problems with the intestine, a tumor, or irritable bowel syndrome.   Cancer of the female organs or bladder.   A severely tipped uterus.   A very tight opening or closed cervix.   Noncancerous tumors of the uterus (fibroids).   Pelvic inflammatory disease (PID).   Pelvic scarring (adhesions) from a previous surgery.   Ovarian cyst.   An intrauterine device (IUD) used for birth control.  CAUSES  The cause of menstrual pain is often unknown. SYMPTOMS   Cramping or throbbing pain in your lower abdomen.   Sometimes, a woman may also experience headaches.   Lower back pain.    Feeling sick to your stomach (nausea) or vomiting.   Diarrhea.   Sweating or dizziness.  DIAGNOSIS  A diagnosis is based on your history, symptoms, physical examination, diagnostic tests, or procedures. Diagnostic tests or procedures may include:  Blood tests.   An ultrasound.   An examination of the lining of the uterus (dilation and curettage, D&C).   An examination inside your abdomen or pelvis with a scope (laparoscopy).   X-rays.   CT Scan.   MRI.   An examination inside the bladder with a scope (cystoscopy).   An examination inside the intestine or stomach with a scope (colonoscopy, gastroscopy).  TREATMENT  Treatment depends on the cause of the dysmenorrhea. Treatment may include:  Pain medicine prescribed by your caregiver.   Birth control pills.   Hormone replacement therapy.   Nonsteroidal anti-inflammatory drugs (NSAIDs). These may help stop the production of prostaglandins.   An IUD with progesterone hormone in it.   Acupuncture.   Surgery to remove adhesions, endometriosis, ovarian cyst, or fibroids.   Removal of the uterus (hysterectomy).   Progesterone shots to stop the menstrual period.   Cutting the nerves on the sacrum that go to the female organs (presacral neurectomy).   Electric currant to the sacral nerves (sacral nerve stimulation).   Antidepressant medicine.   Psychiatric therapy, counseling, or group therapy.   Exercise and physical therapy.   Meditation and yoga therapy.  HOME CARE INSTRUCTIONS   Only take over-the-counter or prescription medicines for pain, discomfort, or fever as directed by your   caregiver.   Place a heating pad or hot water bottle on your lower back or abdomen. Do not sleep with the heating pad.   Use aerobic exercises, walking, swimming, biking, and other exercises to help lessen the cramping.   Massage to the lower back or abdomen may help.   Stop smoking.   Avoid alcohol and caffeine.   Yoga,  meditation, or acupuncture may help.  SEEK MEDICAL CARE IF:   The pain does not get better with medicine.   You have pain with sexual intercourse.  SEEK IMMEDIATE MEDICAL CARE IF:   Your pain increases and is not controlled with medicines.   You have a fever.   You develop nausea or vomiting with your period not controlled with medicine.   You have abnormal vaginal bleeding with your period.   You pass out.  MAKE SURE YOU:   Understand these instructions.   Will watch your condition.   Will get help right away if you are not doing well or get worse.  Document Released: 09/05/2005 Document Revised: 05/18/2011 Document Reviewed: 12/22/2008 [employer] Patient Information 2012 [employer]. 

## 2011-10-20 NOTE — Assessment & Plan Note (Signed)
Patient is reporting worsening discomfort and increased irregularity of menstrual cycles in the past 1-2 years. Also notes increased vaginal discharge and abdominal bloating. Was having some Left lower quadrant pain prior to having her IUD removed in the fall of 2012 but the left sided pain has improved greatly unfortunately has had increased right sided pain with significant intermittent abdominal bloating each month.

## 2011-10-20 NOTE — Progress Notes (Signed)
Patient ID: Jackie [name], female   DOB: 07/11/1971, 41 y.o.   MRN: [id number] Jackie [name] [id number] 1970/12/05 10/20/2011      Progress Note-Follow Up  Subjective  Chief Complaint  Chief Complaint  Patient presents with  . discuss birth control    HPI  Patient is a 41 year old female who is in today for evaluation of menstrual irregularity and abdominal bloating. she reports that in the fall of 2012 she had her IUD removed after it was found to be dislodged into the left. She was having left lower quadrant pain at that time. The left lower quadrant pain is now resolved. Unfortunately she is having right lower quadrant pain and notes that her vaginal ultrasound did show ovarian cysts on the right. She also notes her periods become much more irregular. She occasionally has frank red and other times passes large darker clots. Periods have become much less predictable. It can come every couple of weeks or take over a month. He can be short or long. She denies any other trauma pain but does note occasional blood streak on the tissue after a bowel movement as well. Denies any straining or obvious hemorrhoids. No other change in bowels such as melena or worsening constipation. No fevers, chills. She does note increased vaginal discharge intermittently throughout the month recently as well. No fevers, chills, dysuria. She offers no breast complaints at today's visit  Past Medical History  Diagnosis Date  . Vitamin d deficiency 2012  . Chicken pox 41 yrs old  . Thyroid dysfunction 07/22/2011  . Anemia     during pregnancy  . Fibrocystic breast 07/22/2011  . Pleurisy 08/29/2011  . Dysmenorrhea 10/20/2011    History reviewed. No pertinent past surgical history.  Family History  Problem Relation Age of Onset  . Diabetes Mother     type 2  . Stroke Mother   . Osteoporosis Mother   . Heart disease Mother     angina  . Heart attack Father     MI in his 14s  . Diabetes Father     type 2    . Obesity Sister   . Diabetes Sister   . Obesity Brother   . Diabetes Brother     type 2  . Aneurysm Maternal Grandmother   . Heart attack Maternal Grandfather   . Pneumonia Paternal Grandfather   . Hypertension Sister     induced  . Other Sister     anemic  . Anemia Sister   . Depression Sister     History   Social History  . Marital Status: Married    Spouse Name: N/A    Number of Children: 1  . Years of Education: N/A   Occupational History  . Not on file.   Social History Main Topics  . Smoking status: Never Smoker   . Smokeless tobacco: Never Used  . Alcohol Use: Yes     1 to 2 glasses 5 times a week. but then the next week maybe nothing  . Drug Use: No  . Sexually Active: Yes -- Female partner(s)   Other Topics Concern  . Not on file   Social History Narrative  . No narrative on file    Current Outpatient Prescriptions on File Prior to Visit  Medication Sig Dispense Refill  . bacitracin ointment Apply small amount to b/l nares at bed x 7days  30 g  0  . Cholecalciferol (VITAMIN D) 1000 UNITS capsule Take 2,500 Units by mouth daily.        Marland Kitchen  cyclobenzaprine (FLEXERIL) 10 MG tablet Take 1 tablet (10 mg total) by mouth every 8 (eight) hours as needed for muscle spasms.  30 tablet  1  . Multiple Vitamin (MULTIVITAMIN) tablet Take 1 tablet by mouth daily.        . nitroGLYCERIN (NITROSTAT) 0.4 MG SL tablet Place 1 tablet (0.4 mg total) under the tongue every 5 (five) minutes as needed for chest pain.  25 tablet  1    No Known Allergies  Review of Systems  Review of Systems  Constitutional: Negative for fever and malaise/fatigue.  HENT: Negative for congestion.   Eyes: Negative for discharge.  Respiratory: Negative for shortness of breath.   Cardiovascular: Negative for chest pain, palpitations and leg swelling.  Gastrointestinal: Positive for abdominal pain and blood in stool. Negative for nausea, diarrhea, constipation and melena.       Patient does  note having some occasional blood on the tissue after bowel movements, it is a small amount, bright red and resolves spontaneously  Genitourinary: Negative for dysuria.  Musculoskeletal: Negative for falls.  Skin: Negative for rash.  Neurological: Negative for loss of consciousness and headaches.  Endo/Heme/Allergies: Negative for polydipsia.  Psychiatric/Behavioral: Negative for depression and suicidal ideas. The patient is not nervous/anxious and does not have insomnia.     Objective  Ht 5' 1.75" (1.568 m)  Physical Exam  Physical Exam  Constitutional: She is oriented to person, place, and time and well-developed, well-nourished, and in no distress. No distress.  HENT:  Head: Normocephalic and atraumatic.  Eyes: Conjunctivae are normal.  Neck: Neck supple. No thyromegaly present.  Cardiovascular: Normal rate, regular rhythm and normal heart sounds.   No murmur heard. Pulmonary/Chest: Effort normal and breath sounds normal. She has no wheezes.  Abdominal: She exhibits no distension and no mass.  Musculoskeletal: She exhibits no edema.  Lymphadenopathy:    She has no cervical adenopathy.  Neurological: She is alert and oriented to person, place, and time.  Skin: Skin is warm and dry. No rash noted. She is not diaphoretic.  Psychiatric: Memory, affect and judgment normal.    Lab Results  Component Value Date   TSH 2.228 07/22/2011   Lab Results  Component Value Date   WBC 8.2 07/22/2011   HGB 14.6 07/22/2011   HCT 42.5 07/22/2011   MCV 92.6 07/22/2011   PLT 281 07/22/2011   Lab Results  Component Value Date   CREATININE 0.68 07/22/2011   BUN 16 07/22/2011   NA 137 07/22/2011   K 4.7 07/22/2011   CL 101 07/22/2011   CO2 25 07/22/2011   Lab Results  Component Value Date   ALT 13 07/22/2011   AST 20 07/22/2011   ALKPHOS 47 07/22/2011   BILITOT 0.6 07/22/2011   Lab Results  Component Value Date   CHOL 213* 08/16/2011   Lab Results  Component Value Date   HDL 83.20  08/16/2011   No results found for this basename: [facility]   Lab Results  Component Value Date   TRIG 103.0 08/16/2011   Lab Results  Component Value Date   CHOLHDL 3 08/16/2011     Assessment & Plan  Dysmenorrhea Patient is reporting worsening discomfort and increased irregularity of menstrual cycles in the past 1-2 years. Also notes increased vaginal discharge and abdominal bloating. Was having some Left lower quadrant pain prior to having her IUD removed in the fall of 2012 but the left sided pain has improved greatly unfortunately has had increased right sided pain  with significant intermittent abdominal bloating each month.  Ovarian cyst Patient in today concerned about ongoing menstrual irregularity, bloating and discomfort. She reports in the fall of 2012 she had a vaginal ultrasound which showed some right-sided ovarian cyst. It also showed that her IUD become implanted and inflamed on the left-hand side. At that time she been having left-sided abdominal pain the pain is essentially resolved. Unfortunately she's been having right-sided abdominal pain which is intermittent. She does intermittent bloating. She describes increased menstrual or irregularity. Sometimes passing clots sometimes passing frank red blood. Sometimes in small amounts in between periods and symmetric long extended painful periods. She is interested in a referral to share her options regarding treatment for her multiple concerns and further evaluation. This is arranged and we will see her in followup once this has been completed.

## 2011-12-22 ENCOUNTER — Encounter: Payer: Self-pay | Admitting: Family Medicine

## 2011-12-22 ENCOUNTER — Ambulatory Visit (INDEPENDENT_AMBULATORY_CARE_PROVIDER_SITE_OTHER): Payer: No Typology Code available for payment source | Admitting: Family Medicine

## 2011-12-22 VITALS — BP 105/71 | HR 64 | Ht 61.75 in | Wt 136.0 lb

## 2011-12-22 DIAGNOSIS — L309 Dermatitis, unspecified: Secondary | ICD-10-CM

## 2011-12-22 DIAGNOSIS — M79609 Pain in unspecified limb: Secondary | ICD-10-CM

## 2011-12-22 DIAGNOSIS — M25472 Effusion, left ankle: Secondary | ICD-10-CM | POA: Insufficient documentation

## 2011-12-22 DIAGNOSIS — L259 Unspecified contact dermatitis, unspecified cause: Secondary | ICD-10-CM

## 2011-12-22 DIAGNOSIS — M25473 Effusion, unspecified ankle: Secondary | ICD-10-CM

## 2011-12-22 DIAGNOSIS — R0789 Other chest pain: Secondary | ICD-10-CM

## 2011-12-22 DIAGNOSIS — M79669 Pain in unspecified lower leg: Secondary | ICD-10-CM

## 2011-12-22 DIAGNOSIS — F411 Generalized anxiety disorder: Secondary | ICD-10-CM

## 2011-12-22 DIAGNOSIS — F419 Anxiety disorder, unspecified: Secondary | ICD-10-CM

## 2011-12-22 LAB — CBC WITH DIFFERENTIAL/PLATELET
Basophils Absolute: 0 10*3/uL (ref 0.0–0.1)
Basophils Relative: 0 % (ref 0–1)
Eosinophils Absolute: 0 10*3/uL (ref 0.0–0.7)
Eosinophils Relative: 0 % (ref 0–5)
HCT: 40.4 % (ref 36.0–46.0)
Hemoglobin: 13.6 g/dL (ref 12.0–15.0)
Lymphocytes Relative: 31 % (ref 12–46)
Lymphs Abs: 2.8 10*3/uL (ref 0.7–4.0)
MCH: 31.6 pg (ref 26.0–34.0)
MCHC: 33.7 g/dL (ref 30.0–36.0)
MCV: 94 fL (ref 78.0–100.0)
Monocytes Absolute: 0.5 10*3/uL (ref 0.1–1.0)
Monocytes Relative: 6 % (ref 3–12)
Neutro Abs: 5.6 10*3/uL (ref 1.7–7.7)
Neutrophils Relative %: 63 % (ref 43–77)
Platelets: 237 10*3/uL (ref 150–400)
RBC: 4.3 MIL/uL (ref 3.87–5.11)
RDW: 11.9 % (ref 11.5–15.5)
WBC: 8.9 10*3/uL (ref 4.0–10.5)

## 2011-12-22 LAB — COMPREHENSIVE METABOLIC PANEL
ALT: 13 U/L (ref 0–35)
AST: 20 U/L (ref 0–37)
Albumin: 4.4 g/dL (ref 3.5–5.2)
Alkaline Phosphatase: 47 U/L (ref 39–117)
BUN: 12 mg/dL (ref 6–23)
CO2: 28 mEq/L (ref 19–32)
Calcium: 9.5 mg/dL (ref 8.4–10.5)
Chloride: 100 mEq/L (ref 96–112)
Creat: 0.53 mg/dL (ref 0.50–1.10)
Glucose, Bld: 86 mg/dL (ref 70–99)
Potassium: 4.4 mEq/L (ref 3.5–5.3)
Sodium: 136 mEq/L (ref 135–145)
Total Bilirubin: 0.4 mg/dL (ref 0.3–1.2)
Total Protein: 7.1 g/dL (ref 6.0–8.3)

## 2011-12-22 LAB — PROTIME-INR
INR: 0.91 (ref <1.50)
Prothrombin Time: 12.6 seconds (ref 11.6–15.2)

## 2011-12-22 LAB — D-DIMER, QUANTITATIVE (NOT AT ARMC): D-Dimer, Quant: 0.22 ug/mL-FEU (ref 0.00–0.48)

## 2011-12-22 MED ORDER — FLUTICASONE PROPIONATE 0.05 % EX CREA: TOPICAL_CREAM | Freq: Two times a day (BID) | CUTANEOUS | Status: DC

## 2011-12-22 NOTE — Assessment & Plan Note (Signed)
No evidence of any abnormality on exam today. Given hx of brief calf pain/lower leg "heaviness" feeling, and her atypical CP sx's, I feel it would be prudent to r/o venous thrombosis. Will check D-dimer stat, and if positive will set up LE venous doppler. With globally positive ROS, will check CBC and CMET today.

## 2011-12-22 NOTE — Progress Notes (Signed)
OFFICE VISIT  12/22/2011   CC:  Chief Complaint  Patient presents with  . Edema    left ankle     HPI:    Patient is a 41 y.o. [place] female patient of Dr. Abner Greenspan who presents for multiple complaints. Primary symptom is left ankle swelling on and off since about 2 days ago (swollen in evening, back to normal in morning).  No redness or pain, no injury.  No hx of similar ankle swelling on either side in the past. Yesterday she felt what she describes as left sided chest ache/tightness that was associated with a feeling of generalized fatigue, "suddenly very tired".  No palpitations, no diaphoresis, no SOB.  Today still feels mild left sided chest pressure, but ankle is back to normal again.  No recent fevers, no myalgias or arthralgias, no URI/ST/cough/HA, or diarrhea.    ROS:   Positive for: mild nausea x 2d, +anxiety/panic attacks, brief left hand tingling yesterday, brief left lower leg "heaviness" feeling yesterday.  Mild blurry vision, mainly in mornings lately.  +Itchy patch of rash on upper back that "flares" when she gets "out of sorts" about things.   +Long hx of DUB, but she thinks her LMP was 1 wk ago, +heavy as per her usual, with bad abd cramps. +Stresses lately: worried about her son, some marital disagreements, struggling to adjust to life in a new place. NEGATIVE FOR: prolonged immobility recently, recent surgery.  She does not take any OCP.  No vomiting, no abdominal pain.  PO intake is normal.  No polyuria, polydipsia, or polyphagia.  No urinary sx's. No thigh pains.  No depression.   Past Medical History  Diagnosis Date  . Vitamin d deficiency 2012  . Chicken pox 41 yrs old  . Thyroid dysfunction 07/22/2011  . Anemia     during pregnancy  . Fibrocystic breast 07/22/2011  . Pleurisy 08/29/2011  . Dysmenorrhea 10/20/2011  Atypical chest pain: Eval by Dr. Jens Som at [facility] cardiology 09/01/2011.  ECHO unremarkable, EKG normal.  No further testing recommended at  that time.  History reviewed. No pertinent past surgical history.  Outpatient Prescriptions Prior to Visit  Medication Sig Dispense Refill  . Cholecalciferol (VITAMIN D) 1000 UNITS capsule Take 2,500 Units by mouth daily.        . Multiple Vitamin (MULTIVITAMIN) tablet Take 1 tablet by mouth daily.        . nitroGLYCERIN (NITROSTAT) 0.4 MG SL tablet Place 1 tablet (0.4 mg total) under the tongue every 5 (five) minutes as needed for chest pain.  25 tablet  1  . bacitracin ointment Apply small amount to b/l nares at bed x 7days  30 g  0  . cyclobenzaprine (FLEXERIL) 10 MG tablet Take 1 tablet (10 mg total) by mouth every 8 (eight) hours as needed for muscle spasms.  30 tablet  1    No Known Allergies  ROS See HPI above  PE: Blood pressure 105/71, pulse 64, height 5' 1.75" (1.568 m), weight 136 lb (61.689 kg). Gen: Alert, well appearing.  Patient is oriented to person, place, time, and situation. Pleasant affect, lucid thought and speech. ENT:  Eyes: no injection, icteris, swelling, or exudate.  EOMI, PERRLA. Nose: no drainage or turbinate edema/swelling.  No injection or focal lesion.  Mouth: lips without lesion/swelling.  Oral mucosa pink and moist.  Dentition intact and without obvious caries or gingival swelling.  Oropharynx without erythema, exudate, or swelling.  Neck - No masses or thyromegaly or  limitation in range of motion CV: RRR, no m/r/g.   LUNGS: CTA bilat, nonlabored resps, good aeration in all lung fields. ABD: soft, NT, ND, BS normal.  No hepatospenomegaly or mass.  No bruits. EXT: no clubbing, cyanosis, or edema.   No calf or popliteal tenderness. Ankles symmetric and without any abnormality.  ROM normal.  Homan's sign negative. Neuro: CN 2-12 intact bilaterally, strength 5/5 in proximal and distal upper extremities and lower extremities bilaterally.  No sensory deficits.  No tremor.  No disdiadochokinesis.  No ataxia.  Upper extremity and lower extremity DTRs symmetric.   No pronator drift. SKIN: upper back with golf ball sized patch of pinkish hyperkeratotic papular rash.  No pustules or vesicles or erythema.  LABS:  None today  IMPRESSION AND PLAN:  Left ankle swelling No evidence of any abnormality on exam today. Given hx of brief calf pain/lower leg "heaviness" feeling, and her atypical CP sx's, I feel it would be prudent to r/o venous thrombosis. Will check D-dimer stat, and if positive will set up LE venous doppler. With globally positive ROS, will check CBC and CMET today.     Eczema Generic cutivate 0.05% bid prn rx'd today.  Anxiety disorder Definite chronic worrier + describes frequent mild panic attacks. Discussed possibility of needing med treatment for this and I encouraged her to take this issue up with Dr. Abner Greenspan in the near future.      FOLLOW UP: No Follow-up on file.

## 2011-12-22 NOTE — Assessment & Plan Note (Signed)
Generic cutivate 0.05% bid prn rx'd today.

## 2011-12-22 NOTE — Assessment & Plan Note (Signed)
Definite chronic worrier + describes frequent mild panic attacks. Discussed possibility of needing med treatment for this and I encouraged her to take this issue up with Dr. Abner Greenspan in the near future.

## 2011-12-23 MED ORDER — HYDROCORTISONE VALERATE 0.2 % EX CREA: TOPICAL_CREAM | CUTANEOUS | Status: DC

## 2011-12-23 NOTE — Progress Notes (Signed)
Addended by: Andrew Au on: 12/23/2011 08:35 AM   Modules accepted: Orders

## 2012-05-07 ENCOUNTER — Ambulatory Visit (INDEPENDENT_AMBULATORY_CARE_PROVIDER_SITE_OTHER): Payer: PRIVATE HEALTH INSURANCE | Admitting: Family Medicine

## 2012-05-07 ENCOUNTER — Encounter: Payer: Self-pay | Admitting: Family Medicine

## 2012-05-07 VITALS — BP 108/73 | HR 97 | Temp 97.8°F | Ht 61.75 in | Wt 130.8 lb

## 2012-05-07 DIAGNOSIS — F329 Major depressive disorder, single episode, unspecified: Secondary | ICD-10-CM

## 2012-05-07 DIAGNOSIS — R109 Unspecified abdominal pain: Secondary | ICD-10-CM

## 2012-05-07 DIAGNOSIS — M791 Myalgia, unspecified site: Secondary | ICD-10-CM

## 2012-05-07 DIAGNOSIS — IMO0001 Reserved for inherently not codable concepts without codable children: Secondary | ICD-10-CM

## 2012-05-07 DIAGNOSIS — R5381 Other malaise: Secondary | ICD-10-CM

## 2012-05-07 DIAGNOSIS — R197 Diarrhea, unspecified: Secondary | ICD-10-CM

## 2012-05-07 DIAGNOSIS — F32A Depression, unspecified: Secondary | ICD-10-CM

## 2012-05-07 DIAGNOSIS — R5383 Other fatigue: Secondary | ICD-10-CM

## 2012-05-07 LAB — CBC WITH DIFFERENTIAL/PLATELET
Basophils Absolute: 0 10*3/uL (ref 0.0–0.1)
Basophils Relative: 0.4 % (ref 0.0–3.0)
Eosinophils Absolute: 0 10*3/uL (ref 0.0–0.7)
Eosinophils Relative: 0.2 % (ref 0.0–5.0)
HCT: 43.4 % (ref 36.0–46.0)
Hemoglobin: 14.3 g/dL (ref 12.0–15.0)
Lymphocytes Relative: 31.3 % (ref 12.0–46.0)
Lymphs Abs: 2.3 10*3/uL (ref 0.7–4.0)
MCHC: 32.9 g/dL (ref 30.0–36.0)
MCV: 94.6 fl (ref 78.0–100.0)
Monocytes Absolute: 0.4 10*3/uL (ref 0.1–1.0)
Monocytes Relative: 5.4 % (ref 3.0–12.0)
Neutro Abs: 4.7 10*3/uL (ref 1.4–7.7)
Neutrophils Relative %: 62.7 % (ref 43.0–77.0)
Platelets: 251 10*3/uL (ref 150.0–400.0)
RBC: 4.59 Mil/uL (ref 3.87–5.11)
RDW: 11.9 % (ref 11.5–14.6)
WBC: 7.4 10*3/uL (ref 4.5–10.5)

## 2012-05-07 LAB — COMPREHENSIVE METABOLIC PANEL
ALT: 17 U/L (ref 0–35)
AST: 21 U/L (ref 0–37)
Albumin: 3.9 g/dL (ref 3.5–5.2)
Alkaline Phosphatase: 52 U/L (ref 39–117)
BUN: 15 mg/dL (ref 6–23)
CO2: 28 mEq/L (ref 19–32)
Calcium: 9.6 mg/dL (ref 8.4–10.5)
Chloride: 102 mEq/L (ref 96–112)
Creatinine, Ser: 0.6 mg/dL (ref 0.4–1.2)
GFR: 116.47 mL/min (ref >60.00)
Glucose, Bld: 71 mg/dL (ref 70–99)
Potassium: 4.6 mEq/L (ref 3.5–5.1)
Sodium: 138 mEq/L (ref 135–145)
Total Bilirubin: 0.5 mg/dL (ref 0.3–1.2)
Total Protein: 7.3 g/dL (ref 6.0–8.3)

## 2012-05-07 LAB — TSH: TSH: 2.2 u[IU]/mL (ref 0.35–5.50)

## 2012-05-07 LAB — SEDIMENTATION RATE: Sed Rate: 21 mm/hr (ref 0–22)

## 2012-05-07 NOTE — Assessment & Plan Note (Signed)
Acute-on-chronic, plus chronic myalgias diffusely. She may have a viral syndrome currently, but I strongly sense that her symptoms are somatic manifestations of her psychosocial/emotional turmoil that she is struggling with lately. We discussed this openly, and I recommended we do some routine testing to exclude some other problems, but I told her that treatment for depression/anxiety and/or fibromyalgia would be my next step if all labs came back normal (CBC, CMET, ESR, TSH, stool studies). She expressed understanding of the plan and was in agreement.  She wants to think it over, research things some, etc before committing to treatment with medicine of any kind right now.   I asked her to f/u in a few weeks with either Dr. Abner Greenspan or myself.
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
## 2012-05-08 NOTE — Progress Notes (Signed)
Quick Note:  Patient Informed and voiced understanding ______ 

## 2012-05-09 LAB — GIARDIA/CRYPTOSPORIDIUM (EIA)
Cryptosporidium Screen (EIA): NEGATIVE
Giardia Screen (EIA): NEGATIVE

## 2012-05-09 LAB — ROTAVIRUS ANTIGEN, STOOL: Rotavirus: NEGATIVE

## 2012-05-09 LAB — FECAL LACTOFERRIN, QUANT: Lactoferrin: NEGATIVE

## 2012-05-10 LAB — CLOSTRIDIUM DIFFICILE BY PCR: Toxigenic C. Difficile by PCR: NOT DETECTED

## 2012-05-12 LAB — STOOL CULTURE

## 2012-07-11 ENCOUNTER — Other Ambulatory Visit (HOSPITAL_COMMUNITY)
Admission: RE | Admit: 2012-07-11 | Discharge: 2012-07-11 | Disposition: A | Discharge status: Home / Self Care | Payer: No Typology Code available for payment source | Source: Ambulatory Visit | Attending: Family Medicine | Admitting: Family Medicine

## 2012-07-11 ENCOUNTER — Encounter: Payer: Self-pay | Admitting: Family Medicine

## 2012-07-11 ENCOUNTER — Ambulatory Visit (INDEPENDENT_AMBULATORY_CARE_PROVIDER_SITE_OTHER): Payer: PRIVATE HEALTH INSURANCE | Admitting: Family Medicine

## 2012-07-11 VITALS — BP 115/77 | HR 70 | Temp 99.0°F | Ht 61.75 in | Wt 133.4 lb

## 2012-07-11 DIAGNOSIS — Z202 Contact with and (suspected) exposure to infections with a predominantly sexual mode of transmission: Secondary | ICD-10-CM

## 2012-07-11 DIAGNOSIS — N760 Acute vaginitis: Secondary | ICD-10-CM

## 2012-07-11 DIAGNOSIS — F419 Anxiety disorder, unspecified: Secondary | ICD-10-CM

## 2012-07-11 DIAGNOSIS — Z9189 Other specified personal risk factors, not elsewhere classified: Secondary | ICD-10-CM

## 2012-07-11 DIAGNOSIS — Z113 Encounter for screening for infections with a predominantly sexual mode of transmission: Secondary | ICD-10-CM | POA: Insufficient documentation

## 2012-07-11 DIAGNOSIS — F411 Generalized anxiety disorder: Secondary | ICD-10-CM

## 2012-07-12 LAB — RPR

## 2012-07-12 LAB — HIV ANTIBODY (ROUTINE TESTING W REFLEX): HIV: NONREACTIVE

## 2012-07-12 NOTE — Progress Notes (Signed)
Quick Note:  Patient Informed and voiced understanding ______ 

## 2012-07-13 NOTE — Progress Notes (Signed)
Quick Note:  Patient Informed and voiced understanding ______ 

## 2012-07-15 ENCOUNTER — Encounter: Payer: Self-pay | Admitting: Family Medicine

## 2012-07-15 DIAGNOSIS — N760 Acute vaginitis: Secondary | ICD-10-CM

## 2012-07-15 HISTORY — DX: Acute vaginitis: N76.0

## 2012-07-15 NOTE — Progress Notes (Signed)
Patient ID: Jackie [name], female   DOB: 1971/01/18, 41 y.o.   MRN: [id number] Jackie [name] [id number] 06-12-1971 07/15/2012      Progress Note-Follow Up  Subjective  Chief Complaint  Chief Complaint  Patient presents with  . Follow-up    HPI  Patient is a 41 year old female who is in today for followup. She continues to struggle with high grade stress and anxiety. Acknowledges that her marriage has been somewhat rocky and she's not sure if there's Fidelity. She has had some mild vaginal discharge and is interested in having some testing done. No abdominal or back pain. No fevers or chills. No GI complaints. No dysuria, urgency frequency. No chest pain, palpitations, shortness of breath. Sheet knowledge is she has great deal of anxiety and acknowledges some low-grade depression but feels she's handling it relatively well at this time.  Past Medical History  Diagnosis Date  . Vitamin D deficiency 2012  . Chicken pox 41 yrs old  . Thyroid dysfunction 07/22/2011  . Anemia     during pregnancy  . Fibrocystic breast 07/22/2011  . Pleurisy 08/29/2011  . Dysmenorrhea 10/20/2011  . Vaginitis 07/15/2012    History reviewed. No pertinent past surgical history.  Family History  Problem Relation Age of Onset  . Diabetes Mother     type 2  . Stroke Mother   . Osteoporosis Mother   . Heart disease Mother     angina  . Heart attack Father     MI in his 68s  . Diabetes Father     type 2  . Obesity Sister   . Diabetes Sister   . Obesity Brother   . Diabetes Brother     type 2  . Aneurysm Maternal Grandmother   . Heart attack Maternal Grandfather   . Pneumonia Paternal Grandfather   . Hypertension Sister     induced  . Other Sister     anemic  . Anemia Sister   . Depression Sister     History   Social History  . Marital Status: Married    Spouse Name: N/A    Number of Children: 1  . Years of Education: N/A   Occupational History  . Not on file.   Social History  Main Topics  . Smoking status: Never Smoker   . Smokeless tobacco: Never Used  . Alcohol Use: Yes     1 to 2 glasses 5 times a week. but then the next week maybe nothing  . Drug Use: No  . Sexually Active: Yes -- Female partner(s)   Other Topics Concern  . Not on file   Social History Narrative  . No narrative on file    Current Outpatient Prescriptions on File Prior to Visit  Medication Sig Dispense Refill  . Multiple Vitamin (MULTIVITAMIN) tablet Take 1 tablet by mouth daily.        Marland Kitchen OVER THE COUNTER MEDICATION Emer[facility]      . Cholecalciferol (VITAMIN D) 1000 UNITS capsule Take 2,500 Units by mouth daily.          No Known Allergies  Review of Systems  Review of Systems  Constitutional: Negative for fever and malaise/fatigue.  HENT: Negative for congestion.   Eyes: Negative for discharge.  Respiratory: Negative for shortness of breath.   Cardiovascular: Negative for chest pain, palpitations and leg swelling.  Gastrointestinal: Negative for nausea, abdominal pain and diarrhea.  Genitourinary: Negative for dysuria.  Musculoskeletal: Negative for falls.  Skin: Negative for rash.  Neurological: Negative for loss of consciousness and headaches.  Endo/Heme/Allergies: Negative for polydipsia.  Psychiatric/Behavioral: Positive for depression. Negative for suicidal ideas. The patient is nervous/anxious. The patient does not have insomnia.     Objective  BP 115/77  Pulse 70  Temp 99 F (37.2 C) (Temporal)  Ht 5' 1.75" (1.568 m)  Wt 133 lb 6.4 oz (60.51 kg)  BMI 24.60 kg/m2  SpO2 98%  LMP 06/19/2012  Physical Exam  Physical Exam  Constitutional: She is oriented to person, place, and time and well-developed, well-nourished, and in no distress. No distress.  HENT:  Head: Normocephalic and atraumatic.  Eyes: Conjunctivae normal are normal.  Neck: Neck supple. No thyromegaly present.  Cardiovascular: Normal rate, regular rhythm and normal heart sounds.   No murmur  heard. Pulmonary/Chest: Effort normal and breath sounds normal. She has no wheezes.  Abdominal: She exhibits no distension and no mass.  Genitourinary: Uterus normal, cervix normal, right adnexa normal and left adnexa normal. Vaginal discharge found.  Musculoskeletal: She exhibits no edema.  Lymphadenopathy:    She has no cervical adenopathy.  Neurological: She is alert and oriented to person, place, and time.  Skin: Skin is warm and dry. No rash noted. She is not diaphoretic.  Psychiatric: Memory, affect and judgment normal.    Lab Results  Component Value Date   TSH 2.20 05/07/2012   Lab Results  Component Value Date   WBC 7.4 05/07/2012   HGB 14.3 05/07/2012   HCT 43.4 05/07/2012   MCV 94.6 05/07/2012   PLT 251.0 05/07/2012   Lab Results  Component Value Date   CREATININE 0.6 05/07/2012   BUN 15 05/07/2012   NA 138 05/07/2012   K 4.6 05/07/2012   CL 102 05/07/2012   CO2 28 05/07/2012   Lab Results  Component Value Date   ALT 17 05/07/2012   AST 21 05/07/2012   ALKPHOS 52 05/07/2012   BILITOT 0.5 05/07/2012   Lab Results  Component Value Date   CHOL 213* 08/16/2011   Lab Results  Component Value Date   HDL 83.20 08/16/2011   No results found for this basename: [facility]   Lab Results  Component Value Date   TRIG 103.0 08/16/2011   Lab Results  Component Value Date   CHOLHDL 3 08/16/2011     Assessment & Plan  Anxiety disorder Acknowledges this is a real concern for her but feels she does not need medications at the present time. Is struggling with a difficult marriage. Will let us know if she decides she needs meds. Is willing to try counseling is given contact info  Vaginitis Is unsure if her husband is being monogamous. Will check cultures today

## 2012-07-15 NOTE — Assessment & Plan Note (Signed)
Is unsure if her husband is being monogamous. Will check cultures today

## 2012-07-15 NOTE — Assessment & Plan Note (Signed)
Acknowledges this is a real concern for her but feels she does not need medications at the present time. Is struggling with a difficult marriage. Will let us know if she decides she needs meds. Is willing to try counseling is given contact info

## 2012-07-18 NOTE — Progress Notes (Signed)
Quick Note:  Patient Informed and voiced understanding ______ 

## 2012-08-23 ENCOUNTER — Other Ambulatory Visit: Payer: Self-pay | Admitting: Family Medicine

## 2012-08-23 DIAGNOSIS — Z1231 Encounter for screening mammogram for malignant neoplasm of breast: Secondary | ICD-10-CM

## 2012-10-01 ENCOUNTER — Ambulatory Visit: Payer: PRIVATE HEALTH INSURANCE

## 2012-10-02 ENCOUNTER — Ambulatory Visit
Admission: RE | Admit: 2012-10-02 | Discharge: 2012-10-02 | Disposition: A | Discharge status: Home / Self Care | Payer: PRIVATE HEALTH INSURANCE | Source: Ambulatory Visit | Attending: Family Medicine | Admitting: Family Medicine

## 2012-10-02 DIAGNOSIS — Z1231 Encounter for screening mammogram for malignant neoplasm of breast: Secondary | ICD-10-CM

## 2012-12-21 ENCOUNTER — Ambulatory Visit (INDEPENDENT_AMBULATORY_CARE_PROVIDER_SITE_OTHER): Payer: 59 | Admitting: Family Medicine

## 2012-12-21 ENCOUNTER — Encounter: Payer: Self-pay | Admitting: Family Medicine

## 2012-12-21 VITALS — BP 128/79 | HR 88 | Temp 99.0°F | Ht 61.75 in | Wt 136.4 lb

## 2012-12-21 DIAGNOSIS — R109 Unspecified abdominal pain: Secondary | ICD-10-CM

## 2012-12-21 DIAGNOSIS — R319 Hematuria, unspecified: Secondary | ICD-10-CM

## 2012-12-21 DIAGNOSIS — F411 Generalized anxiety disorder: Secondary | ICD-10-CM

## 2012-12-21 DIAGNOSIS — N809 Endometriosis, unspecified: Secondary | ICD-10-CM

## 2012-12-21 DIAGNOSIS — E871 Hypo-osmolality and hyponatremia: Secondary | ICD-10-CM

## 2012-12-21 DIAGNOSIS — F419 Anxiety disorder, unspecified: Secondary | ICD-10-CM

## 2012-12-21 LAB — POCT URINALYSIS DIPSTICK
Bilirubin, UA: NEGATIVE
Glucose, UA: NEGATIVE
Ketones, UA: NEGATIVE
Leukocytes, UA: NEGATIVE
Nitrite, UA: NEGATIVE
Protein, UA: NEGATIVE
Spec Grav, UA: 1.015
Urobilinogen, UA: 0.2
pH, UA: 5.5

## 2012-12-21 LAB — CBC
HCT: 38 % (ref 36.0–46.0)
Hemoglobin: 13 g/dL (ref 12.0–15.0)
MCHC: 34.1 g/dL (ref 30.0–36.0)
MCV: 93.3 fl (ref 78.0–100.0)
Platelets: 247 10*3/uL (ref 150.0–400.0)
RBC: 4.07 Mil/uL (ref 3.87–5.11)
RDW: 12.1 % (ref 11.5–14.6)
WBC: 9.4 10*3/uL (ref 4.5–10.5)

## 2012-12-21 LAB — TSH: TSH: 3.48 u[IU]/mL (ref 0.35–5.50)

## 2012-12-21 LAB — HEPATIC FUNCTION PANEL
ALT: 18 U/L (ref 0–35)
AST: 25 U/L (ref 0–37)
Albumin: 3.8 g/dL (ref 3.5–5.2)
Alkaline Phosphatase: 55 U/L (ref 39–117)
Bilirubin, Direct: 0.1 mg/dL (ref 0.0–0.3)
Total Bilirubin: 0.4 mg/dL (ref 0.3–1.2)
Total Protein: 7.1 g/dL (ref 6.0–8.3)

## 2012-12-21 LAB — RENAL FUNCTION PANEL
Albumin: 3.8 g/dL (ref 3.5–5.2)
BUN: 15 mg/dL (ref 6–23)
CO2: 28 mEq/L (ref 19–32)
Calcium: 9 mg/dL (ref 8.4–10.5)
Chloride: 99 mEq/L (ref 96–112)
Creatinine, Ser: 0.5 mg/dL (ref 0.4–1.2)
GFR: 140.08 mL/min (ref >60.00)
Glucose, Bld: 67 mg/dL — ABNORMAL LOW (ref 70–99)
Phosphorus: 4 mg/dL (ref 2.3–4.6)
Potassium: 3.8 mEq/L (ref 3.5–5.1)
Sodium: 133 mEq/L — ABNORMAL LOW (ref 135–145)

## 2012-12-21 NOTE — Patient Instructions (Addendum)
Start a probiotic such as Digestive Advantage by [employer]  DASH Diet The DASH diet stands for "Dietary Approaches to Stop Hypertension." It is a healthy eating plan that has been shown to reduce high blood pressure (hypertension) in as little as 14 days, while also possibly providing other significant health benefits. These other health benefits include reducing the risk of breast cancer after menopause and reducing the risk of type 2 diabetes, heart disease, colon cancer, and stroke. Health benefits also include weight loss and slowing kidney failure in patients with chronic kidney disease.  DIET GUIDELINES  Limit salt (sodium). Your diet should contain less than 1500 mg of sodium daily.  Limit refined or processed carbohydrates. Your diet should include mostly whole grains. Desserts and added sugars should be used sparingly.  Include small amounts of heart-healthy fats. These types of fats include nuts, oils, and tub margarine. Limit saturated and trans fats. These fats have been shown to be harmful in the body. CHOOSING FOODS  The following food groups are based on a 2000 calorie diet. See your Registered Dietitian for individual calorie needs. Grains and Grain Products (6 to 8 servings daily)  Eat More Often: Whole-wheat bread, brown rice, whole-grain or wheat pasta, quinoa, popcorn without added fat or salt (air popped).  Eat Less Often: White bread, white pasta, white rice, cornbread. Vegetables (4 to 5 servings daily)  Eat More Often: Fresh, frozen, and canned vegetables. Vegetables may be raw, steamed, roasted, or grilled with a minimal amount of fat.  Eat Less Often/Avoid: Creamed or fried vegetables. Vegetables in a cheese sauce. Fruit (4 to 5 servings daily)  Eat More Often: All fresh, canned (in natural juice), or frozen fruits. Dried fruits without added sugar. One hundred percent fruit juice ( cup [237 mL] daily).  Eat Less Often: Dried fruits with added sugar. Canned fruit in  light or heavy syrup. [employer], Fish, and Poultry (2 servings or less daily. One serving is 3 to 4 oz [85-114 g]).  Eat More Often: Ninety percent or leaner ground beef, tenderloin, sirloin. Round cuts of beef, chicken breast, [place] breast. All fish. Grill, bake, or broil your meat. Nothing should be fried.  Eat Less Often/Avoid: Fatty cuts of meat, [place], or chicken leg, thigh, or wing. Fried cuts of meat or fish. Dairy (2 to 3 servings)  Eat More Often: Low-fat or fat-free milk, low-fat plain or light yogurt, reduced-fat or part-skim cheese.  Eat Less Often/Avoid: Milk (whole, 2%).Whole milk yogurt. Full-fat cheeses. Nuts, Seeds, and Legumes (4 to 5 servings per week)  Eat More Often: All without added salt.  Eat Less Often/Avoid: Salted nuts and seeds, canned beans with added salt. Fats and Sweets (limited)  Eat More Often: Vegetable oils, tub margarines without trans fats, sugar-free gelatin. Mayonnaise and salad dressings.  Eat Less Often/Avoid: Coconut oils, palm oils, butter, stick margarine, cream, half and half, cookies, candy, pie. FOR MORE INFORMATION The Dash Diet Eating Plan: www.dashdiet.org Document Released: 08/25/2011 Document Revised: 11/28/2011 Document Reviewed: 08/25/2011 [facility] Patient Information 2013 [place], [place].

## 2012-12-22 ENCOUNTER — Encounter: Payer: Self-pay | Admitting: Family Medicine

## 2012-12-22 DIAGNOSIS — N809 Endometriosis, unspecified: Secondary | ICD-10-CM

## 2012-12-22 DIAGNOSIS — E871 Hypo-osmolality and hyponatremia: Secondary | ICD-10-CM | POA: Insufficient documentation

## 2012-12-22 HISTORY — DX: Hypo-osmolality and hyponatremia: E87.1

## 2012-12-22 HISTORY — DX: Endometriosis, unspecified: N80.9

## 2012-12-22 NOTE — Assessment & Plan Note (Signed)
Has not been seen by GYN for a while and is having increased abdominal bloating and discomfort in the lower regions. Is having intermittent episodes of sharp pains which sound suspicious for GI spasm within the bowels 6 Potential Scar Tissue. She's Encouraged to Start a Probiotic and Fiber Supplement. Increase Her Hydration and to Followup with GYN If Symptoms Are Persistent and GYN Is Unable to Find a Cause She Will Then Need Further Referral to GI and Further Consideration

## 2012-12-22 NOTE — Assessment & Plan Note (Signed)
Feels she's doing better in this regard for marital difficulties and began to calm down. Does not feel that her anxiety is contributing to her abdominal pain at this time.

## 2012-12-22 NOTE — Progress Notes (Signed)
Patient ID: Jackie [name], female   DOB: Oct 27, 1970, 42 y.o.   MRN: [id number] Jackie [name] [id number] March 12, 1971 12/22/2012      Progress Note-Follow Up  Subjective  Chief Complaint  Chief Complaint  Patient presents with  . gyn pain    HPI  Patient is a 42 year old female who is in today for evaluation of lower abdominal pain. In the past she's had trouble with bloating and discomfort in her lower abdomen and had been evaluated by GYN. They had noted the most likely cause with endometriosis. Patient reports symptoms have improved but are now slowly worsening. Her menses continue to occur but are slightly irregular at this time. She is describing a dull ache in her lower abdomen as well as her low back. Does have occasional episodes of sharper more fleeting pain off and on for her bowels move. No fevers or chills. No complaints of vaginal discharge or anorexia. No nausea, bloody stool or diarrhea. No chest pain or palpitations, shortness of breath.  Past Medical History  Diagnosis Date  . Vitamin D deficiency 2012  . Chicken pox 42 yrs old  . Thyroid dysfunction 07/22/2011  . Anemia     during pregnancy  . Fibrocystic breast 07/22/2011  . Pleurisy 08/29/2011  . Dysmenorrhea 10/20/2011  . Vaginitis 07/15/2012  . Endometriosis 12/22/2012  . Hyponatremia 12/22/2012    History reviewed. No pertinent past surgical history.  Family History  Problem Relation Age of Onset  . Diabetes Mother     type 2  . Stroke Mother   . Osteoporosis Mother   . Heart disease Mother     angina  . Heart attack Father     MI in his 20s  . Diabetes Father     type 2  . Obesity Sister   . Diabetes Sister   . Obesity Brother   . Diabetes Brother     type 2  . Aneurysm Maternal Grandmother   . Heart attack Maternal Grandfather   . Pneumonia Paternal Grandfather   . Hypertension Sister     induced  . Other Sister     anemic  . Anemia Sister   . Depression Sister     History   Social History   . Marital Status: Married    Spouse Name: N/A    Number of Children: 1  . Years of Education: N/A   Occupational History  . Not on file.   Social History Main Topics  . Smoking status: Never Smoker   . Smokeless tobacco: Never Used  . Alcohol Use: Yes     Comment: 1 to 2 glasses 5 times a week. but then the next week maybe nothing  . Drug Use: No  . Sexually Active: Yes -- Female partner(s)   Other Topics Concern  . Not on file   Social History Narrative  . No narrative on file    Current Outpatient Prescriptions on File Prior to Visit  Medication Sig Dispense Refill  . Cholecalciferol (VITAMIN D) 1000 UNITS capsule Take 2,500 Units by mouth daily.        . Multiple Vitamin (MULTIVITAMIN) tablet Take 1 tablet by mouth daily.        Marland Kitchen OVER THE COUNTER MEDICATION Emergen-C       No current facility-administered medications on file prior to visit.    No Known Allergies  Review of Systems  Review of Systems  Constitutional: Negative for fever and malaise/fatigue.  HENT: Negative for congestion.   Eyes:  Negative for discharge.  Respiratory: Negative for shortness of breath.   Cardiovascular: Negative for chest pain, palpitations and leg swelling.  Gastrointestinal: Positive for abdominal pain and constipation. Negative for nausea and diarrhea.  Genitourinary: Negative for dysuria.  Musculoskeletal: Negative for falls.  Skin: Negative for rash.  Neurological: Negative for loss of consciousness and headaches.  Endo/Heme/Allergies: Negative for polydipsia.  Psychiatric/Behavioral: Negative for depression and suicidal ideas. The patient is not nervous/anxious and does not have insomnia.     Objective  BP 128/79  Pulse 88  Temp(Src) 99 F (37.2 C) (Temporal)  Ht 5' 1.75" (1.568 m)  Wt 136 lb 6.4 oz (61.871 kg)  BMI 25.16 kg/m2  SpO2 98%  LMP 12/07/2012  Physical Exam  Physical Exam  Constitutional: She is oriented to person, place, and time and well-developed,  well-nourished, and in no distress. No distress.  HENT:  Head: Normocephalic and atraumatic.  Eyes: Conjunctivae are normal.  Neck: Neck supple. No thyromegaly present.  Cardiovascular: Normal rate, regular rhythm and normal heart sounds.   No murmur heard. Pulmonary/Chest: Effort normal and breath sounds normal. She has no wheezes.  Abdominal: She exhibits no distension and no mass.  Musculoskeletal: She exhibits no edema.  Lymphadenopathy:    She has no cervical adenopathy.  Neurological: She is alert and oriented to person, place, and time.  Skin: Skin is warm and dry. No rash noted. She is not diaphoretic.  Psychiatric: Memory, affect and judgment normal.    Lab Results  Component Value Date   TSH 3.48 12/21/2012   Lab Results  Component Value Date   WBC 9.4 12/21/2012   HGB 13.0 12/21/2012   HCT 38.0 12/21/2012   MCV 93.3 12/21/2012   PLT 247.0 12/21/2012   Lab Results  Component Value Date   CREATININE 0.5 12/21/2012   BUN 15 12/21/2012   NA 133* 12/21/2012   K 3.8 12/21/2012   CL 99 12/21/2012   CO2 28 12/21/2012   Lab Results  Component Value Date   ALT 18 12/21/2012   AST 25 12/21/2012   ALKPHOS 55 12/21/2012   BILITOT 0.4 12/21/2012   Lab Results  Component Value Date   CHOL 213* 08/16/2011   Lab Results  Component Value Date   HDL 83.20 08/16/2011   No results found for this basename: LDLCALC   Lab Results  Component Value Date   TRIG 103.0 08/16/2011   Lab Results  Component Value Date   CHOLHDL 3 08/16/2011     Assessment & Plan  Endometriosis Has not been seen by GYN for a while and is having increased abdominal bloating and discomfort in the lower regions. Is having intermittent episodes of sharp pains which sound suspicious for GI spasm within the bowels 6 Potential Scar Tissue. She's Encouraged to Start a Probiotic and Fiber Supplement. Increase Her Hydration and to Followup with GYN If Symptoms Are Persistent and GYN Is Unable to Find a Cause She Will Then Need  Further Referral to GI and Further Consideration  Anxiety disorder Feels she's doing better in this regard for marital difficulties and began to calm down. Does not feel that her anxiety is contributing to her abdominal pain at this time.  Hyponatremia Mild, decrease fluids slightly and recheck at next visit.

## 2012-12-22 NOTE — Assessment & Plan Note (Signed)
Mild, decrease fluids slightly and recheck at next visit.

## 2012-12-23 LAB — URINE CULTURE
Colony Count: NO GROWTH
Organism ID, Bacteria: NO GROWTH

## 2012-12-26 ENCOUNTER — Other Ambulatory Visit: Payer: Self-pay | Admitting: Emergency Medicine

## 2012-12-26 ENCOUNTER — Other Ambulatory Visit: Payer: 59

## 2012-12-26 ENCOUNTER — Other Ambulatory Visit (INDEPENDENT_AMBULATORY_CARE_PROVIDER_SITE_OTHER): Payer: 59

## 2012-12-26 DIAGNOSIS — N809 Endometriosis, unspecified: Secondary | ICD-10-CM

## 2012-12-26 DIAGNOSIS — R109 Unspecified abdominal pain: Secondary | ICD-10-CM

## 2012-12-26 LAB — FECAL OCCULT BLOOD, IMMUNOCHEMICAL: Fecal Occult Bld: NEGATIVE

## 2013-02-06 ENCOUNTER — Telehealth: Payer: Self-pay | Admitting: Family Medicine

## 2013-02-06 ENCOUNTER — Ambulatory Visit (INDEPENDENT_AMBULATORY_CARE_PROVIDER_SITE_OTHER): Payer: 59 | Admitting: Nurse Practitioner

## 2013-02-06 ENCOUNTER — Encounter: Payer: Self-pay | Admitting: Nurse Practitioner

## 2013-02-06 DIAGNOSIS — E875 Hyperkalemia: Secondary | ICD-10-CM

## 2013-02-06 DIAGNOSIS — T63461A Toxic effect of venom of wasps, accidental (unintentional), initial encounter: Secondary | ICD-10-CM

## 2013-02-06 DIAGNOSIS — E079 Disorder of thyroid, unspecified: Secondary | ICD-10-CM

## 2013-02-06 DIAGNOSIS — Z0000 Encounter for general adult medical examination without abnormal findings: Secondary | ICD-10-CM

## 2013-02-06 DIAGNOSIS — R21 Rash and other nonspecific skin eruption: Secondary | ICD-10-CM

## 2013-02-06 DIAGNOSIS — T6391XA Toxic effect of contact with unspecified venomous animal, accidental (unintentional), initial encounter: Secondary | ICD-10-CM

## 2013-02-06 NOTE — Telephone Encounter (Signed)
Order lipid, renal, cbc, tsh, hepatic, for annual and ana, sed rate and rf for rash

## 2013-02-06 NOTE — Progress Notes (Signed)
Subjective:    Patient ID: Jackie [name], female    DOB: 1970/12/03, 42 y.o.   MRN: [id number]  HPI Comments: Pt here because she was stung by wasp at L hand, web between thumb and index finger, by wasp, 4 hours ago. Pt concerned because hand is painful and she feel somewhat nauseated and has metallic taste in mouth.   Rash This is a recurrent (this rash has been recurring for years with sun & heat exposure) problem. The current episode started 1 to 4 weeks ago (papular rash on arms has been there for several weeks, macular lesionL elbow started 1 week ago). The problem has been gradually worsening since onset. The affected locations include the left elbow, left axilla, right arm and left arm. The rash is characterized by redness and itchiness (L elbow & axillae are flat, discrete and purplish. rash on arms is papular erythematous diffuse). Associated with: sun. Pt has developed rash from sun exposure since her 20's, more frequent episodes with living in geographical locations w/more intense sun. Pertinent negatives include no cough, eye pain, fatigue, fever or shortness of breath. (Has experienced hair loss, today reporting nausea and metallic taste in mouth) Treatments tried: sunscreen. The treatment provided mild relief.      Review of Systems  Constitutional: Negative for fever and fatigue.  HENT: Positive for facial swelling (reports feeling of swelling in mouth). Negative for drooling, mouth sores, trouble swallowing and voice change.   Eyes: Negative for pain and redness.  Respiratory: Positive for chest tightness (has had some chest tightness in last hour). Negative for cough, shortness of breath and wheezing.   Cardiovascular: Negative for chest pain and palpitations.  Gastrointestinal: Negative for abdominal pain.  Genitourinary: Positive for menstrual problem (heavy menstrual bleeding-gynecology treating for endometriosis).  Musculoskeletal: Positive for joint swelling (large great  toe bunions).  Skin: Positive for rash (see HPI for rash details) and wound (bee sting L hand).  Allergic/Immunologic: Negative for environmental allergies.  Neurological: Negative for headaches.  Psychiatric/Behavioral:       Tearful as discussed stress in marriage       Objective:   Physical Exam  Nursing note and vitals reviewed. Constitutional: She is oriented to person, place, and time. She appears well-developed and well-nourished. No distress (tearful when discussing marital concerns).  HENT:  Head: Normocephalic and atraumatic.  Mouth/Throat: Oropharynx is clear and moist.  Eyes: Conjunctivae are normal.  Neck: Normal range of motion. Neck supple. No tracheal deviation present. No thyromegaly present.  Cardiovascular: Normal rate, regular rhythm and normal heart sounds.   No murmur heard. Pulmonary/Chest: Effort normal and breath sounds normal. No stridor. No respiratory distress. She has no wheezes. She has no rales.  Musculoskeletal: Normal range of motion.  Large bunions noted bilateral great toe  Lymphadenopathy:    She has no cervical adenopathy.  Neurological: She is alert and oriented to person, place, and time.  Skin: Skin is warm and dry. Rash (L elbow & axillae are flat, discrete and purplish. rash on arms is 1 mm papular erythematous lesions, diffuse) noted.  Psychiatric: She has a normal mood and affect. Her behavior is normal. Thought content normal.  teary          Assessment & Plan:  1. Sting from hornet, wasp, or bee, initial encounter See pt instructions  2. Rash, skin I suspect this chronic recurrent rash related to sun exposure and the macular purplish lesion on L elbow may be related to autoimmune disease. Post sun-exposure  rashes can be a symptom of SLE. Pt declined screening labs today, would like to speak with PCP. I recommend ANA with titer, ESR, C3 & C4, with referral to rheumatology or dermatology for biopsy of macular lesion as it may be  discoid lupus. Pt does not want to use steroid cream.

## 2013-02-06 NOTE — Patient Instructions (Signed)
Ice your hand. Use ibuprophen for pain. Benadryl cream can be helpful for the itching. I do not see any stinger in your hand.  Until you see Dr. Abner Greenspan, please continue to stay out of the sun, use sunscreen with SPF of 30 or greater. I am concerned that your rash may be related to autoimmune disease and sun exposure can cause flares.

## 2013-02-06 NOTE — Telephone Encounter (Signed)
Please advise 

## 2013-02-07 ENCOUNTER — Telehealth: Payer: Self-pay | Admitting: *Deleted

## 2013-02-07 NOTE — Telephone Encounter (Signed)
Patient left message on voice mail wanting to know if she needs to go to [facility] for labs.

## 2013-02-07 NOTE — Telephone Encounter (Signed)
Labs ordered and detailed message left on answering machine

## 2013-02-08 NOTE — Telephone Encounter (Signed)
Called patient to let her know that she could have her labs done at either one. Patient scheduled lab appointment at [facility] on 02/28/13 at 9:45 am.

## 2013-02-19 LAB — HM PAP SMEAR: HM Pap smear: NORMAL

## 2013-02-21 ENCOUNTER — Telehealth: Payer: Self-pay

## 2013-02-21 DIAGNOSIS — L299 Pruritus, unspecified: Secondary | ICD-10-CM

## 2013-02-21 NOTE — Telephone Encounter (Signed)
Pt left a message stating that she saw the nurse practitioner previously for a rash. Pt stated that there are no red patches anymore but it itches inside her right elbow and between her shoulder blades. Pt would like to know if there is any labs she could add to her labwork this month or what she should do? Pts appt with MD is 03-07-13  Please advise?

## 2013-02-21 NOTE — Telephone Encounter (Signed)
Can add a sed rate, ANA to labs for pruritus

## 2013-02-22 NOTE — Telephone Encounter (Signed)
Pt informed

## 2013-02-28 ENCOUNTER — Other Ambulatory Visit: Payer: Self-pay | Admitting: Family Medicine

## 2013-02-28 ENCOUNTER — Other Ambulatory Visit (INDEPENDENT_AMBULATORY_CARE_PROVIDER_SITE_OTHER): Payer: 59

## 2013-02-28 DIAGNOSIS — L299 Pruritus, unspecified: Secondary | ICD-10-CM

## 2013-02-28 DIAGNOSIS — Z0000 Encounter for general adult medical examination without abnormal findings: Secondary | ICD-10-CM

## 2013-02-28 LAB — SEDIMENTATION RATE: Sed Rate: 4 mm/hr (ref 0–22)

## 2013-02-28 LAB — RENAL FUNCTION PANEL
Albumin: 4 g/dL (ref 3.5–5.2)
BUN: 11 mg/dL (ref 6–23)
CO2: 25 mEq/L (ref 19–32)
Calcium: 9.2 mg/dL (ref 8.4–10.5)
Chloride: 102 mEq/L (ref 96–112)
Creat: 0.65 mg/dL (ref 0.50–1.10)
Glucose, Bld: 82 mg/dL (ref 70–99)
Phosphorus: 3.4 mg/dL (ref 2.3–4.6)
Potassium: 4.2 mEq/L (ref 3.5–5.3)
Sodium: 136 mEq/L (ref 135–145)

## 2013-02-28 LAB — HEPATIC FUNCTION PANEL
ALT: 12 U/L (ref 0–35)
AST: 19 U/L (ref 0–37)
Albumin: 4 g/dL (ref 3.5–5.2)
Alkaline Phosphatase: 50 U/L (ref 39–117)
Bilirubin, Direct: 0.1 mg/dL (ref 0.0–0.3)
Indirect Bilirubin: 0.4 mg/dL (ref 0.0–0.9)
Total Bilirubin: 0.5 mg/dL (ref 0.3–1.2)
Total Protein: 6.8 g/dL (ref 6.0–8.3)

## 2013-02-28 LAB — LIPID PANEL
Cholesterol: 197 mg/dL (ref 0–200)
HDL: 86 mg/dL (ref >39)
LDL Cholesterol: 87 mg/dL (ref 0–99)
Total CHOL/HDL Ratio: 2.3 Ratio
Triglycerides: 122 mg/dL (ref <150)
VLDL: 24 mg/dL (ref 0–40)

## 2013-02-28 LAB — CBC
HCT: 39.3 % (ref 36.0–46.0)
Hemoglobin: 13.2 g/dL (ref 12.0–15.0)
MCH: 30.7 pg (ref 26.0–34.0)
MCHC: 33.6 g/dL (ref 30.0–36.0)
MCV: 91.4 fL (ref 78.0–100.0)
Platelets: 246 10*3/uL (ref 150–400)
RBC: 4.3 MIL/uL (ref 3.87–5.11)
RDW: 12.7 % (ref 11.5–15.5)
WBC: 6.4 10*3/uL (ref 4.0–10.5)

## 2013-02-28 LAB — TSH: TSH: 5.173 u[IU]/mL — ABNORMAL HIGH (ref 0.350–4.500)

## 2013-02-28 NOTE — Progress Notes (Signed)
Labs only

## 2013-02-28 NOTE — Addendum Note (Signed)
Addended by: Baldemar Lenis R on: 02/28/2013 10:13 AM   Modules accepted: Orders

## 2013-03-05 LAB — T4, FREE: Free T4: 1.13 ng/dL (ref 0.80–1.80)

## 2013-03-07 ENCOUNTER — Ambulatory Visit (INDEPENDENT_AMBULATORY_CARE_PROVIDER_SITE_OTHER): Payer: 59 | Admitting: Family Medicine

## 2013-03-07 ENCOUNTER — Encounter: Payer: Self-pay | Admitting: Family Medicine

## 2013-03-07 VITALS — BP 120/78 | HR 65 | Temp 98.3°F | Ht 61.75 in | Wt 136.1 lb

## 2013-03-07 DIAGNOSIS — R0789 Other chest pain: Secondary | ICD-10-CM

## 2013-03-07 DIAGNOSIS — F411 Generalized anxiety disorder: Secondary | ICD-10-CM

## 2013-03-07 DIAGNOSIS — E079 Disorder of thyroid, unspecified: Secondary | ICD-10-CM

## 2013-03-07 DIAGNOSIS — R5381 Other malaise: Secondary | ICD-10-CM

## 2013-03-07 DIAGNOSIS — N809 Endometriosis, unspecified: Secondary | ICD-10-CM

## 2013-03-07 DIAGNOSIS — F419 Anxiety disorder, unspecified: Secondary | ICD-10-CM

## 2013-03-07 DIAGNOSIS — Z0000 Encounter for general adult medical examination without abnormal findings: Secondary | ICD-10-CM | POA: Insufficient documentation

## 2013-03-07 DIAGNOSIS — E871 Hypo-osmolality and hyponatremia: Secondary | ICD-10-CM

## 2013-03-07 DIAGNOSIS — R5383 Other fatigue: Secondary | ICD-10-CM

## 2013-03-07 DIAGNOSIS — B360 Pityriasis versicolor: Secondary | ICD-10-CM

## 2013-03-07 DIAGNOSIS — R21 Rash and other nonspecific skin eruption: Secondary | ICD-10-CM

## 2013-03-07 HISTORY — DX: Encounter for general adult medical examination without abnormal findings: Z00.00

## 2013-03-07 HISTORY — DX: Pityriasis versicolor: B36.0

## 2013-03-07 MED ORDER — KETOCONAZOLE 2 % EX SHAM: MEDICATED_SHAMPOO | CUTANEOUS | Status: DC

## 2013-03-07 MED ORDER — DIAZEPAM 5 MG PO TABS: ORAL_TABLET | ORAL | Status: DC

## 2013-03-07 MED ORDER — CETIRIZINE HCL 10 MG PO TABS: 10.0000 mg | ORAL_TABLET | Freq: Every day | ORAL | Status: DC | PRN

## 2013-03-07 MED ORDER — LORATADINE 10 MG PO TABS: ORAL_TABLET | ORAL | Status: DC

## 2013-03-07 NOTE — Assessment & Plan Note (Signed)
Had a recent GYN exam and they are considering surgery but patient is hesitant.

## 2013-03-07 NOTE — Patient Instructions (Addendum)
TSH and freeT4 in 3-4 months.  Try a Diazepam for the shooting pains. Try Zantac 150 tab once or twice daily if atypical chest pains persist Mylanta is also helpful  Tinea Versicolor Tinea versicolor is a common yeast infection of the skin. This condition becomes known when the yeast on our skin starts to overgrow (yeast is a normal inhabitant on our skin). This condition is noticed as white or light brown patches on brown skin, and is more evident in the summer on tanned skin. These areas are slightly scaly if scratched. The light patches from the yeast become evident when the yeast creates "holes in your suntan". This is most often noticed in the summer. The patches are usually located on the chest, back, pubis, neck and body folds. However, it may occur on any area of body. Mild itching and inflammation (redness or soreness) may be present. DIAGNOSIS  The diagnosisof this is made clinically (by looking). Cultures from samples are usually not needed. Examination under the microscope may help. However, yeast is normally found on skin. The diagnosis still remains clinical. Examination under Wood's Ultraviolet Light can determine the extent of the infection. TREATMENT  This common infection is usually only of cosmetic (only a concern to your appearance). It is easily treated with dandruff shampoo used during showers or bathing. Vigorous scrubbing will eliminate the yeast over several days time. The light areas in your skin may remain for weeks or months after the infection is cured unless your skin is exposed to sunlight. The lighter or darker spots caused by the fungus that remain after complete treatment are not a sign of treatment failure; it will take a long time to resolve. Your caregiver may recommend a number of commercial preparations or medication by mouth if home care is not working. Recurrence is common and preventative medication may be necessary. This skin condition is not highly contagious.  Special care is not needed to protect close friends and family members. Normal hygiene is usually enough. Follow up is required only if you develop complications (such as a secondary infection from scratching), if recommended by your caregiver, or if no relief is obtained from the preparations used. Document Released: 09/02/2000 Document Revised: 11/28/2011 Document Reviewed: 10/15/2008 [facility] Patient Information 2014 [place], [place].

## 2013-03-07 NOTE — Assessment & Plan Note (Signed)
Resolved on recent labs.

## 2013-03-07 NOTE — Assessment & Plan Note (Signed)
tsh up but free T4 is normal. Recheck in 3 months time

## 2013-03-07 NOTE — Assessment & Plan Note (Signed)
Sharp, intermittent pains, unlikely cardiac. Concerning for silent reflux vs musculoskeletal. If pain persists try Zantac daily and Mylanta as needed. Notify us if symptoms worsen or persist

## 2013-03-07 NOTE — Assessment & Plan Note (Signed)
Discussed need for stress reduction, adequate sleep, heart healthy diet and regular exercise. Annual labs reviewed at visit.

## 2013-03-07 NOTE — Assessment & Plan Note (Signed)
Discussed anxiety at length. Patient declines daily SSRI, agrees to referral for counseling again given contact information. Did not follow through last time, does agree to take a prescription for Diazepam to use only in urgent situations for CP, panic, insomnia, etc

## 2013-03-07 NOTE — Progress Notes (Signed)
Patient ID: Jackie [name], female   DOB: 05-19-1971, 42 y.o.   MRN: [id number] Jackie [name] [id number] 09/03/1971 03/07/2013      Progress Note-Follow Up  Subjective  Chief Complaint  Chief Complaint  Patient presents with  . Annual Exam    HPI  Patient is a 42 year old female in today for annual exam. She is having numerous concerns. She's been following with OB/GYN and had a likely diagnosis of endometriosis. She has had a normal Pap recently. They are considering surgical intervention. Her largest complaint is of stress and skin difficulties. She now just when she gets overly stressed and hot her skin lesions are worse. Did become more pruritic red and pronounced. She has patches over her elbows and on her thighs as well as elsewhere that are discolored and shiny. They are pruritic intermittently. They're not raised. She did recently have a long standing but has recovered from that. She has ongoing difficulties with her marriage. Very stressful. She is also getting ready to send her only son to college. She notes great difficulty in her marriage with frequent stress. Complains of recent episodes of chest pain. History of off-and-on with atypical chest pain for many years he is describing sharp fleeting pains without associated symptoms at this time. No shortness of breath or palpitations. No GI complaints to speak of. No urinary complaints. For the rash has tried Aveeno and Eucerin products over-the-counter with minimal relief.  Past Medical History  Diagnosis Date  . Vitamin D deficiency 2012  . Chicken pox 42 yrs old  . Thyroid dysfunction 07/22/2011  . Anemia     during pregnancy  . Fibrocystic breast 07/22/2011  . Pleurisy 08/29/2011  . Dysmenorrhea 10/20/2011  . Vaginitis 07/15/2012  . Endometriosis 12/22/2012  . Hyponatremia 12/22/2012    History reviewed. No pertinent past surgical history.  Family History  Problem Relation Age of Onset  . Diabetes Mother     type 2  .  Stroke Mother   . Osteoporosis Mother   . Heart disease Mother     angina  . Heart attack Father     MI in his 15s  . Diabetes Father     type 2  . Obesity Sister   . Diabetes Sister   . Obesity Brother   . Diabetes Brother     type 2  . Aneurysm Maternal Grandmother   . Heart attack Maternal Grandfather   . Pneumonia Paternal Grandfather   . Hypertension Sister     induced  . Other Sister     anemic  . Anemia Sister   . Depression Sister     History   Social History  . Marital Status: Married    Spouse Name: N/A    Number of Children: 1  . Years of Education: N/A   Occupational History  . Not on file.   Social History Main Topics  . Smoking status: Never Smoker   . Smokeless tobacco: Never Used  . Alcohol Use: Yes     Comment: 1 to 2 glasses 5 times a week. but then the next week maybe nothing  . Drug Use: No  . Sexually Active: Yes -- Female partner(s)   Other Topics Concern  . Not on file   Social History Narrative  . No narrative on file    Current Outpatient Prescriptions on File Prior to Visit  Medication Sig Dispense Refill  . Cholecalciferol (VITAMIN D) 1000 UNITS capsule Take 2,500 Units by mouth daily.        Marland Kitchen  Multiple Vitamin (MULTIVITAMIN) tablet Take 1 tablet by mouth daily.        Marland Kitchen OVER THE COUNTER MEDICATION Emergen-C       No current facility-administered medications on file prior to visit.    No Known Allergies  Review of Systems  Review of Systems  Constitutional: Negative for fever, chills and malaise/fatigue.  HENT: Negative for hearing loss, nosebleeds and congestion.   Eyes: Negative for discharge.  Respiratory: Negative for cough, sputum production, shortness of breath and wheezing.   Cardiovascular: Negative for chest pain, palpitations and leg swelling.  Gastrointestinal: Negative for heartburn, nausea, vomiting, abdominal pain, diarrhea, constipation and blood in stool.  Genitourinary: Negative for dysuria, urgency,  frequency and hematuria.  Musculoskeletal: Negative for myalgias, back pain and falls.  Skin: Positive for itching and rash.  Neurological: Negative for dizziness, tremors, sensory change, focal weakness, loss of consciousness, weakness and headaches.  Endo/Heme/Allergies: Negative for polydipsia. Does not bruise/bleed easily.  Psychiatric/Behavioral: Negative for depression and suicidal ideas. The patient is not nervous/anxious and does not have insomnia.     Objective  BP 120/78  Pulse 65  Temp(Src) 98.3 F (36.8 C) (Oral)  Ht 5' 1.75" (1.568 m)  Wt 136 lb 1.3 oz (61.725 kg)  BMI 25.11 kg/m2  SpO2 99%  LMP 02/06/2013  Physical Exam  Physical Exam  Constitutional: She is oriented to person, place, and time and well-developed, well-nourished, and in no distress. No distress.  HENT:  Head: Normocephalic and atraumatic.  Eyes: Conjunctivae are normal.  Neck: Neck supple. No thyromegaly present.  Cardiovascular: Normal rate, regular rhythm and normal heart sounds.   No murmur heard. Pulmonary/Chest: Effort normal and breath sounds normal. She has no wheezes.  Abdominal: She exhibits no distension and no mass.  Musculoskeletal: She exhibits no edema.  Lymphadenopathy:    She has no cervical adenopathy.  Neurological: She is alert and oriented to person, place, and time.  Skin: Skin is warm and dry. Rash noted. She is not diaphoretic.  Large patches of discolored silvery patches over flexor surface of both elbows, b/l inner thighs, left upper anterior chest wall  Psychiatric: Memory, affect and judgment normal.    Lab Results  Component Value Date   TSH 5.173* 02/28/2013   Lab Results  Component Value Date   WBC 6.4 02/28/2013   HGB 13.2 02/28/2013   HCT 39.3 02/28/2013   MCV 91.4 02/28/2013   PLT 246 02/28/2013   Lab Results  Component Value Date   CREATININE 0.65 02/28/2013   BUN 11 02/28/2013   NA 136 02/28/2013   K 4.2 02/28/2013   CL 102 02/28/2013   CO2 25 02/28/2013    Lab Results  Component Value Date   ALT 12 02/28/2013   AST 19 02/28/2013   ALKPHOS 50 02/28/2013   BILITOT 0.5 02/28/2013   Lab Results  Component Value Date   CHOL 197 02/28/2013   Lab Results  Component Value Date   HDL 86 02/28/2013   Lab Results  Component Value Date   LDLCALC 87 02/28/2013   Lab Results  Component Value Date   TRIG 122 02/28/2013   Lab Results  Component Value Date   CHOLHDL 2.3 02/28/2013     Assessment & Plan  Tinea versicolor Start on Ketoconazole shampoo and referred to dermatology for further consideration. Will run some auto immune labs such as Sed Rate, RF and ANA today  Thyroid dysfunction tsh up but free T4 is normal. Recheck in 3 months  time  Chest pain, atypical Sharp, intermittent pains, unlikely cardiac. Concerning for silent reflux vs musculoskeletal. If pain persists try Zantac daily and Mylanta as needed. Notify us if symptoms worsen or persist  Endometriosis Had a recent GYN exam and they are considering surgery but patient is hesitant.   Hyponatremia Resolved on recent labs  Anxiety disorder Discussed anxiety at length. Patient declines daily SSRI, agrees to referral for counseling again given contact information. Did not follow through last time, does agree to take a prescription for Diazepam to use only in urgent situations for CP, panic, insomnia, etc  Fatigue Persistent and multifactorial, encouraged heart healthy diet and regular exercise  Preventative health care Discussed need for stress reduction, adequate sleep, heart healthy diet and regular exercise. Annual labs reviewed at visit.

## 2013-03-07 NOTE — Assessment & Plan Note (Addendum)
Start on Ketoconazole shampoo and referred to dermatology for further consideration. Will run some auto immune labs such as Sed Rate, RF and ANA today

## 2013-03-07 NOTE — Assessment & Plan Note (Signed)
Persistent and multifactorial, encouraged heart healthy diet and regular exercise

## 2013-03-08 LAB — RHEUMATOID FACTOR: Rhuematoid fact SerPl-aCnc: <10 IU/mL (ref <=14)

## 2013-03-08 LAB — ANA: Anti Nuclear Antibody(ANA): NEGATIVE

## 2013-03-08 LAB — SEDIMENTATION RATE: Sed Rate: 8 mm/hr (ref 0–22)

## 2013-03-12 ENCOUNTER — Telehealth: Payer: Self-pay

## 2013-03-12 NOTE — Telephone Encounter (Signed)
Patient left a message stating that she had an allergic reaction to the shampoo. Pt states that it burnt her skin and gave her dry skin.Pt would like to know what the next step is.  Per md have pt mention this to Dermatology on the 15th and can try some Zyrtec.  Pt states Zyrtec makes her drowsy but has been doing Claritin.

## 2013-05-10 ENCOUNTER — Encounter: Payer: Self-pay | Admitting: Family Medicine

## 2013-05-10 ENCOUNTER — Ambulatory Visit (INDEPENDENT_AMBULATORY_CARE_PROVIDER_SITE_OTHER): Payer: 59 | Admitting: Family Medicine

## 2013-05-10 VITALS — BP 100/70 | HR 81 | Temp 97.9°F | Ht 61.75 in | Wt 136.2 lb

## 2013-05-10 DIAGNOSIS — J309 Allergic rhinitis, unspecified: Secondary | ICD-10-CM

## 2013-05-10 MED ORDER — FLUTICASONE PROPIONATE 50 MCG/ACT NA SUSP: 2.0000 | Freq: Every day | NASAL | Status: DC

## 2013-05-10 NOTE — Assessment & Plan Note (Signed)
With some PND and eustac tube dysfxn. Reassured pt. Recommended saline nasal spray 2-3 times per day for irrigation. Rx'd flonase to use 2 sprays each nostril every morning.

## 2013-05-10 NOTE — Progress Notes (Signed)
OFFICE NOTE  05/10/2013  CC: No chief complaint on file.    HPI: Patient is a 42 y.o. Caucasian female who is here for swollen neck glands. Notes some pain in the area--mild soreness.  Feels like something is inside right side of her neck. Some tonsil stones that she has noted come up into her mouth.  Says she had these commonly as teenager.  She has not had a tonsillectomy.  Some ear pressures/soreness lately. Has been having some subjective f/c's lately, no temp taken.  Exhausted lately but then she says this is not so unusual for her.  She admits she never lets herself have a moment's rest.  Last visit with Dr. Abner Greenspan she was rx'd diazepam to try but she says she did not fill this rx.   Pertinent PMH:  Past Medical History  Diagnosis Date  . Vitamin D deficiency 2012  . Chicken pox 42 yrs old  . Thyroid dysfunction 07/22/2011  . Anemia     during pregnancy  . Fibrocystic breast 07/22/2011  . Pleurisy 08/29/2011  . Dysmenorrhea 10/20/2011  . Vaginitis 07/15/2012  . Endometriosis 12/22/2012  . Hyponatremia 12/22/2012  . Tinea versicolor 03/07/2013  . Preventative health care 03/07/2013    MEDS: Women's MVI qd  PE: Blood pressure 100/70, pulse 81, temperature 97.9 F (36.6 C), temperature source Oral, height 5' 1.75" (1.568 m), weight 136 lb 4 oz (61.803 kg), last menstrual period 04/08/2013, SpO2 97.00%. Gen: Alert, well appearing.  Patient is oriented to person, place, time, and situation. ENT: Ears: EACs clear, normal epithelium.  TMs with good light reflex and landmarks bilaterally.  Eyes: no injection, icteris, swelling, or exudate.  EOMI, PERRLA. Nose: no drainage or turbinate edema/swelling.  No injection or focal lesion.  Mouth: lips without lesion/swelling.  Oral mucosa pink and moist.  Dentition intact and without obvious caries or gingival swelling.  Oropharynx without erythema, exudate, or swelling.  I see no tonsillar tissue at all. Neck - No masses or thyromegaly or  limitation in range of motion CV: RRR, no m/r/g.   LUNGS: CTA bilat, nonlabored resps, good aeration in all lung fields.  LAB: none today  IMPRESSION AND PLAN:  Allergic rhinitis With some PND and eustac tube dysfxn. Reassured pt. Recommended saline nasal spray 2-3 times per day for irrigation. Rx'd flonase to use 2 sprays each nostril every morning.   An After Visit Summary was printed and given to the patient.  FOLLOW UP: prn

## 2013-05-22 ENCOUNTER — Ambulatory Visit (INDEPENDENT_AMBULATORY_CARE_PROVIDER_SITE_OTHER): Payer: 59 | Admitting: Physician Assistant

## 2013-05-22 ENCOUNTER — Encounter: Payer: Self-pay | Admitting: Physician Assistant

## 2013-05-22 VITALS — BP 104/78 | HR 70 | Temp 98.2°F | Resp 14 | Wt 135.2 lb

## 2013-05-22 DIAGNOSIS — T148 Other injury of unspecified body region: Secondary | ICD-10-CM

## 2013-05-22 DIAGNOSIS — M2550 Pain in unspecified joint: Secondary | ICD-10-CM

## 2013-05-22 DIAGNOSIS — R3915 Urgency of urination: Secondary | ICD-10-CM

## 2013-05-22 DIAGNOSIS — W57XXXA Bitten or stung by nonvenomous insect and other nonvenomous arthropods, initial encounter: Secondary | ICD-10-CM | POA: Insufficient documentation

## 2013-05-22 DIAGNOSIS — N390 Urinary tract infection, site not specified: Secondary | ICD-10-CM | POA: Insufficient documentation

## 2013-05-22 DIAGNOSIS — R300 Dysuria: Secondary | ICD-10-CM

## 2013-05-22 LAB — POCT URINALYSIS DIPSTICK
Bilirubin, UA: NEGATIVE
Glucose, UA: NEGATIVE
Ketones, UA: NEGATIVE
Nitrite, UA: NEGATIVE
Protein, UA: NEGATIVE
Spec Grav, UA: 1.02
Urobilinogen, UA: 0.2
pH, UA: 6

## 2013-05-22 LAB — CBC WITH DIFFERENTIAL/PLATELET
Basophils Absolute: 0 10*3/uL (ref 0.0–0.1)
Basophils Relative: 0 % (ref 0–1)
Eosinophils Absolute: 0 10*3/uL (ref 0.0–0.7)
Eosinophils Relative: 0 % (ref 0–5)
HCT: 39.5 % (ref 36.0–46.0)
Hemoglobin: 13.5 g/dL (ref 12.0–15.0)
Lymphocytes Relative: 35 % (ref 12–46)
Lymphs Abs: 2.8 10*3/uL (ref 0.7–4.0)
MCH: 31.9 pg (ref 26.0–34.0)
MCHC: 34.2 g/dL (ref 30.0–36.0)
MCV: 93.4 fL (ref 78.0–100.0)
Monocytes Absolute: 0.5 10*3/uL (ref 0.1–1.0)
Monocytes Relative: 6 % (ref 3–12)
Neutro Abs: 4.6 10*3/uL (ref 1.7–7.7)
Neutrophils Relative %: 59 % (ref 43–77)
Platelets: 263 10*3/uL (ref 150–400)
RBC: 4.23 MIL/uL (ref 3.87–5.11)
RDW: 12.7 % (ref 11.5–15.5)
WBC: 7.9 10*3/uL (ref 4.0–10.5)

## 2013-05-22 MED ORDER — NITROFURANTOIN MONOHYD MACRO 100 MG PO CAPS: 100.0000 mg | ORAL_CAPSULE | Freq: Two times a day (BID) | ORAL | Status: DC

## 2013-05-22 NOTE — Assessment & Plan Note (Signed)
Will send urine for microscopy and culture.  Rx Macrobid.  Follow-up if symptoms persist or worsen

## 2013-05-22 NOTE — Progress Notes (Signed)
Patient ID: Jackie [name], female   DOB: 10-01-1970, 42 y.o.   MRN: [id number]   Patient presents to clinic today c/o urinary urgency, frequency and dysuria x 4-5 days.  Information was obtained from the patient.  Patient states that she and her husband had intercourse 5 days ago.  Since then she has noticed a significant burning sensation with urination.  Has also noted increase in urgency or frequency.  Denies pmh of cystitis.  Denies fever, N/V, flank pain.  She does endorse malaise and some joint pain of her knees for the past 5 days as well.    Patient also states she has pruritic and painful swelling 2/2 mosquito bites on her legs bilaterally.  She has had this problem since she moved to [facility].  She has taken claritin and used topical steroid creams with alleviation of swelling.  She is concerned because she feels that her knee pain may be caused by her body's reaction to mosquito bites.  Denies spider bite, tick bite.  Does spend a lot of time outdoors.  No recent illness or infections.  Past Medical History  Diagnosis Date  . Vitamin D deficiency 2012  . Chicken pox 42 yrs old  . Thyroid dysfunction 07/22/2011  . Anemia     during pregnancy  . Fibrocystic breast 07/22/2011  . Pleurisy 08/29/2011  . Dysmenorrhea 10/20/2011  . Vaginitis 07/15/2012  . Endometriosis 12/22/2012  . Hyponatremia 12/22/2012  . Tinea versicolor 03/07/2013  . Preventative health care 03/07/2013    Current Outpatient Prescriptions on File Prior to Visit  Medication Sig Dispense Refill  . loratadine (CLARITIN) 10 MG tablet 1 tab po bid prn urticaria  30 tablet  11  . Multiple Vitamin (MULTIVITAMIN) tablet Take 1 tablet by mouth daily.        . Cholecalciferol (VITAMIN D) 1000 UNITS capsule Take 2,500 Units by mouth daily.         No current facility-administered medications on file prior to visit.    Allergies  Allergen Reactions  . Nizoral [Ketoconazole]     Burning and dry skin    Family History  Problem  Relation Age of Onset  . Diabetes Mother     type 2  . Stroke Mother   . Osteoporosis Mother   . Heart disease Mother     angina  . Heart attack Father     MI in his 22s  . Diabetes Father     type 2  . Obesity Sister   . Diabetes Sister   . Obesity Brother   . Diabetes Brother     type 2  . Aneurysm Maternal Grandmother   . Heart attack Maternal Grandfather   . Pneumonia Paternal Grandfather   . Hypertension Sister     induced  . Other Sister     anemic  . Anemia Sister   . Depression Sister     History   Social History  . Marital Status: Married    Spouse Name: N/A    Number of Children: 1  . Years of Education: N/A   Social History Main Topics  . Smoking status: Never Smoker   . Smokeless tobacco: Never Used  . Alcohol Use: Yes     Comment: 1 to 2 glasses 5 times a week. but then the next week maybe nothing  . Drug Use: No  . Sexual Activity: Yes    Partners: Male   Other Topics Concern  . None   Social History Narrative  .  None   Review of Systems  Constitutional: Positive for chills and malaise/fatigue. Negative for fever and weight loss.  Respiratory: Negative for cough, shortness of breath and wheezing.   Gastrointestinal: Negative for nausea, vomiting, abdominal pain, diarrhea, constipation, blood in stool and melena.  Genitourinary: Positive for dysuria, urgency and frequency. Negative for hematuria and flank pain.  Musculoskeletal: Positive for myalgias and joint pain.  Skin: Positive for itching.  Neurological: Negative for headaches.  Endo/Heme/Allergies: Negative for environmental allergies. Does not bruise/bleed easily.   Filed Vitals:   05/22/13 1048  BP: 104/78  Pulse: 70  Temp: 98.2 F (36.8 C)  Resp: 14   Physical Exam  Vitals reviewed. Constitutional: She is oriented to person, place, and time and well-developed, well-nourished, and in no distress.  HENT:  Head: Normocephalic and atraumatic.  Right Ear: External ear normal.   Left Ear: External ear normal.  Nose: Nose normal.  Mouth/Throat: Oropharynx is clear and moist. No oropharyngeal exudate.  TM WNL bilaterally  Eyes: Conjunctivae are normal. Pupils are equal, round, and reactive to light.  Neck: Normal range of motion. Neck supple.  Cardiovascular: Normal rate, regular rhythm, normal heart sounds and intact distal pulses.   Pulmonary/Chest: Effort normal and breath sounds normal. No respiratory distress. She has no wheezes. She has no rales. She exhibits no tenderness.  Musculoskeletal: Normal range of motion. She exhibits no edema and no tenderness.  Lymphadenopathy:    She has no cervical adenopathy.  Neurological: She is alert and oriented to person, place, and time. No cranial nerve deficit.  Skin: Skin is warm and dry.  Few scattered healing papules on thighs bilaterally without erythema, tenderness, drainage.   Recent Results (from the past 2160 hour(s))  SEDIMENTATION RATE     Status: None   Collection Time    02/28/13 10:13 AM      Result Value Range   Sed Rate 4  0 - 22 mm/hr  CBC     Status: None   Collection Time    02/28/13 10:13 AM      Result Value Range   WBC 6.4  4.0 - 10.5 K/uL   RBC 4.30  3.87 - 5.11 MIL/uL   Hemoglobin 13.2  12.0 - 15.0 g/dL   HCT 40.9  81.1 - 91.4 %   MCV 91.4  78.0 - 100.0 fL   MCH 30.7  26.0 - 34.0 pg   MCHC 33.6  30.0 - 36.0 g/dL   RDW 78.2  95.6 - 21.3 %   Platelets 246  150 - 400 K/uL  HEPATIC FUNCTION PANEL     Status: None   Collection Time    02/28/13 10:13 AM      Result Value Range   Total Bilirubin 0.5  0.3 - 1.2 mg/dL   Bilirubin, Direct 0.1  0.0 - 0.3 mg/dL   Indirect Bilirubin 0.4  0.0 - 0.9 mg/dL   Alkaline Phosphatase 50  39 - 117 U/L   AST 19  0 - 37 U/L   ALT 12  0 - 35 U/L   Total Protein 6.8  6.0 - 8.3 g/dL   Albumin 4.0  3.5 - 5.2 g/dL  RENAL FUNCTION PANEL     Status: None   Collection Time    02/28/13 10:13 AM      Result Value Range   Sodium 136  135 - 145 mEq/L    Potassium 4.2  3.5 - 5.3 mEq/L   Chloride 102  96 - 112  mEq/L   CO2 25  19 - 32 mEq/L   Glucose, Bld 82  70 - 99 mg/dL   BUN 11  6 - 23 mg/dL   Creat 4.09  8.11 - 9.14 mg/dL   Albumin 4.0  3.5 - 5.2 g/dL   Calcium 9.2  8.4 - 78.2 mg/dL   Phosphorus 3.4  2.3 - 4.6 mg/dL  LIPID PANEL     Status: None   Collection Time    02/28/13 10:13 AM      Result Value Range   Cholesterol 197  0 - 200 mg/dL   Comment: ATP III Classification:           < 200        mg/dL        Desirable          200 - 239     mg/dL        Borderline High          >= 240        mg/dL        High         Triglycerides 122  <150 mg/dL   HDL 86  >95 mg/dL   Total CHOL/HDL Ratio 2.3     VLDL 24  0 - 40 mg/dL   LDL Cholesterol 87  0 - 99 mg/dL   Comment:       Total Cholesterol/HDL Ratio:CHD Risk                            Coronary Heart Disease Risk Table                                            Men       Women              1/2 Average Risk              3.4        3.3                  Average Risk              5.0        4.4               2X Average Risk              9.6        7.1               3X Average Risk             23.4       11.0     Use the calculated Patient Ratio above and the CHD Risk table      to determine the patient's CHD Risk.     ATP III Classification (LDL):           < 100        mg/dL         Optimal          100 - 129     mg/dL         Near or Above Optimal          130 - 159     mg/dL  Borderline High          160 - 189     mg/dL         High           > 190        mg/dL         Very High        TSH     Status: Abnormal   Collection Time    02/28/13 10:13 AM      Result Value Range   TSH 5.173 (*) 0.350 - 4.500 uIU/mL  T4, FREE     Status: None   Collection Time    02/28/13 10:13 AM      Result Value Range   Free T4 1.13  0.80 - 1.80 ng/dL  SEDIMENTATION RATE     Status: None   Collection Time    03/07/13  3:11 PM      Result Value Range   Sed Rate 8  0 - 22 mm/hr   ANA     Status: None   Collection Time    03/07/13  3:11 PM      Result Value Range   ANA NEG  NEGATIVE  RHEUMATOID FACTOR     Status: None   Collection Time    03/07/13  3:11 PM      Result Value Range   Rheumatoid Factor <10  <=14 IU/mL   Comment:                                Interpretive Table                         Low Positive: 15 - 41 IU/mL                         High Positive:  >= 42 IU/mL            In addition to the RF result, and clinical symptoms including joint      involvement, the 2010 ACR Classification Criteria for      scoring/diagnosing Rheumatoid Arthritis include the results of the      following tests:  CRP (16109), ESR (15010), and CCP (APCA) (60454).      www.rheumatology.org/practice/clinical/classification/ra/ra_2010.asp  POCT URINALYSIS DIPSTICK     Status: Abnormal   Collection Time    05/22/13 11:02 AM      Result Value Range   Color, UA golden     Clarity, UA cloudy     Glucose, UA neg     Bilirubin, UA neg     Ketones, UA neg     Spec Grav, UA 1.020     Blood, UA moderate     pH, UA 6.0     Protein, UA neg     Urobilinogen, UA 0.2     Nitrite, UA neg     Leukocytes, UA small (1+)     Comment: small to moderate   Diagnostics:   Urine Dipstick -- positive for LE and blood.  Assessment/Plan: No problem-specific assessment & plan notes found for this encounter.

## 2013-05-22 NOTE — Patient Instructions (Signed)
Please take antibiotic as prescribed until all pills are gone. Drink plenty of fluids to help flush out your urinary tract.  Please obtain labs.  We will call you with the results.  Continue antihistamine for resolving mosquito bites.  Please call or return to clinic if symptoms persist or worsen.  Urinary Tract Infection Urinary tract infections (UTIs) can develop anywhere along your urinary tract. Your urinary tract is your body's drainage system for removing wastes and extra water. Your urinary tract includes two kidneys, two ureters, a bladder, and a urethra. Your kidneys are a pair of bean-shaped organs. Each kidney is about the size of your fist. They are located below your ribs, one on each side of your spine. CAUSES Infections are caused by microbes, which are microscopic organisms, including fungi, viruses, and bacteria. These organisms are so small that they can only be seen through a microscope. Bacteria are the microbes that most commonly cause UTIs. SYMPTOMS  Symptoms of UTIs may vary by age and gender of the patient and by the location of the infection. Symptoms in young women typically include a frequent and intense urge to urinate and a painful, burning feeling in the bladder or urethra during urination. Older women and men are more likely to be tired, shaky, and weak and have muscle aches and abdominal pain. A fever may mean the infection is in your kidneys. Other symptoms of a kidney infection include pain in your back or sides below the ribs, nausea, and vomiting. DIAGNOSIS To diagnose a UTI, your caregiver will ask you about your symptoms. Your caregiver also will ask to provide a urine sample. The urine sample will be tested for bacteria and white blood cells. White blood cells are made by your body to help fight infection. TREATMENT  Typically, UTIs can be treated with medication. Because most UTIs are caused by a bacterial infection, they usually can be treated with the use of  antibiotics. The choice of antibiotic and length of treatment depend on your symptoms and the type of bacteria causing your infection. HOME CARE INSTRUCTIONS  If you were prescribed antibiotics, take them exactly as your caregiver instructs you. Finish the medication even if you feel better after you have only taken some of the medication.  Drink enough water and fluids to keep your urine clear or pale yellow.  Avoid caffeine, tea, and carbonated beverages. They tend to irritate your bladder.  Empty your bladder often. Avoid holding urine for long periods of time.  Empty your bladder before and after sexual intercourse.  After a bowel movement, women should cleanse from front to back. Use each tissue only once. SEEK MEDICAL CARE IF:   You have back pain.  You develop a fever.  Your symptoms do not begin to resolve within 3 days. SEEK IMMEDIATE MEDICAL CARE IF:   You have severe back pain or lower abdominal pain.  You develop chills.  You have nausea or vomiting.  You have continued burning or discomfort with urination. MAKE SURE YOU:   Understand these instructions.  Will watch your condition.  Will get help right away if you are not doing well or get worse. Document Released: 06/15/2005 Document Revised: 03/06/2012 Document Reviewed: 10/14/2011 [facility] Patient Information 2014 [place], [place].

## 2013-05-22 NOTE — Assessment & Plan Note (Signed)
Resolving.  Continue topical anti-itch cream.  Will obtain blood work for lyme disease and RMSF due to arthralgias.

## 2013-05-23 LAB — ROCKY MTN SPOTTED FVR ABS PNL(IGG+IGM)
RMSF IgG: 0.14 IV
RMSF IgM: 0.1 IV

## 2013-05-23 LAB — B. BURGDORFI ANTIBODIES: B burgdorferi Ab IgG+IgM: 0.2 {ISR}

## 2013-05-24 ENCOUNTER — Telehealth: Payer: Self-pay

## 2013-05-24 NOTE — Telephone Encounter (Signed)
Message copied by Court Joy on Fri May 24, 2013  1:51 PM ------      Message from: Marcelline Mates      Created: Fri May 24, 2013  8:05 AM       Please inform patient that her urine culture came back showing E. Coli as the organism responsible for her symptoms.  Her current antibiotic should take care of this infection.  Please remind her to take all antibiotic pills, even if symptoms have improved, and to continue drinking plenty of water.  No follow-up needed unless symptoms return after finishing antibiotics. ------

## 2013-05-24 NOTE — Telephone Encounter (Signed)
Patient informed, understood & agreed; encouraged probiotic & ginger supplement for GI issues w/ABX & OTC Vagasil for vaginal itching; will call back next week if symptoms do not resolve after finishing ABX/SLS

## 2013-05-24 NOTE — Telephone Encounter (Signed)
Patient is calling requesting her lab results.   Callback # [phone]

## 2013-05-25 LAB — URINE CULTURE: Colony Count: 10000

## 2013-07-25 ENCOUNTER — Other Ambulatory Visit: Payer: Self-pay

## 2013-08-06 ENCOUNTER — Telehealth: Payer: Self-pay | Admitting: *Deleted

## 2013-08-06 ENCOUNTER — Ambulatory Visit (INDEPENDENT_AMBULATORY_CARE_PROVIDER_SITE_OTHER): Payer: 59 | Admitting: Family

## 2013-08-06 ENCOUNTER — Encounter: Payer: Self-pay | Admitting: Family

## 2013-08-06 VITALS — BP 98/72 | HR 72 | Temp 98.0°F | Resp 14 | Ht 61.75 in | Wt 134.2 lb

## 2013-08-06 DIAGNOSIS — K1370 Unspecified lesions of oral mucosa: Secondary | ICD-10-CM

## 2013-08-06 DIAGNOSIS — K121 Other forms of stomatitis: Secondary | ICD-10-CM | POA: Insufficient documentation

## 2013-08-06 MED ORDER — MAGIC MOUTHWASH W/LIDOCAINE: 5.0000 mL | Freq: Three times a day (TID) | ORAL | Status: DC | PRN

## 2013-08-06 NOTE — Progress Notes (Signed)
Pre visit review using our clinic review tool, if applicable. No additional management support is needed unless otherwise documented below in the visit note/SLS  

## 2013-08-06 NOTE — Telephone Encounter (Signed)
  Rx: 80 ml viscous lidocaine 2%  80 ml Mylanta  80 ml diphenhydramine 12.5 mg per 5 ml elixir  80 ml nystatin 100,000U suspension  80 ml prednisolone 15mg  per 5ml solution  80 ml distilled water Sig: Swish, gargle, and spit one to two teaspoonfuls every six hours as needed. #237 ml container

## 2013-08-06 NOTE — Patient Instructions (Signed)
Please complete lab work prior to leaving. We will contact you with your results. Call if symptoms worsen or if not improved in 2-3 days.

## 2013-08-06 NOTE — Progress Notes (Signed)
Subjective:    Patient ID: Chelesea Weiand, female    DOB: September 19, 1971, 42 y.o.   MRN: [id number]  HPI  Ms.  hafner is a 42 yr old female who presents today with chief complaint of mouth lesions. Reports that a few weks ago she had lesions around the mouth and the inside of her lips and the roof of her mouth.  Reports that hse felt exhausted.  Attributed to stress from moving.  Used some otc meds and symptoms improved.  Reports she had associated swollen glands.  She continues to have some bilateral ear discomfort. Reports that she has dry mouth and and not sleeping well due to discomfort.  She has had some throat discomfort.   Review of Systems See HPI  Past Medical History  Diagnosis Date  . Vitamin D deficiency 2012  . Chicken pox 43 yrs old  . Thyroid dysfunction 07/22/2011  . Anemia     during pregnancy  . Fibrocystic breast 07/22/2011  . Pleurisy 08/29/2011  . Dysmenorrhea 10/20/2011  . Vaginitis 07/15/2012  . Endometriosis 12/22/2012  . Hyponatremia 12/22/2012  . Tinea versicolor 03/07/2013  . Preventative health care 03/07/2013    History   Social History  . Marital Status: Married    Spouse Name: N/A    Number of Children: 1  . Years of Education: N/A   Occupational History  . Not on file.   Social History Main Topics  . Smoking status: Never Smoker   . Smokeless tobacco: Never Used  . Alcohol Use: Yes     Comment: 1 to 2 glasses 5 times a week. but then the next week maybe nothing  . Drug Use: No  . Sexual Activity: Yes    Partners: Male   Other Topics Concern  . Not on file   Social History Narrative  . No narrative on file    No past surgical history on file.  Family History  Problem Relation Age of Onset  . Diabetes Mother     type 2  . Stroke Mother   . Osteoporosis Mother   . Heart disease Mother     angina  . Heart attack Father     MI in his 77s  . Diabetes Father     type 2  . Obesity Sister   . Diabetes Sister   . Obesity Brother   .  Diabetes Brother     type 2  . Aneurysm Maternal Grandmother   . Heart attack Maternal Grandfather   . Pneumonia Paternal Grandfather   . Hypertension Sister     induced  . Other Sister     anemic  . Anemia Sister   . Depression Sister     Allergies  Allergen Reactions  . Nizoral [Ketoconazole]     Burning and dry skin    Current Outpatient Prescriptions on File Prior to Visit  Medication Sig Dispense Refill  . Cholecalciferol (VITAMIN D) 1000 UNITS capsule Take 2,500 Units by mouth daily.        . fluticasone (FLONASE) 50 MCG/ACT nasal spray Place 2 sprays into the nose daily as needed.      . loratadine (CLARITIN) 10 MG tablet 1 tab po bid prn urticaria  30 tablet  11  . Multiple Vitamin (MULTIVITAMIN) tablet Take 1 tablet by mouth daily.         No current facility-administered medications on file prior to visit.    BP 98/72  Pulse 72  Temp(Src) 98 F (36.7  C) (Oral)  Resp 14  Ht 5' 1.75" (1.568 m)  Wt 134 lb 4 oz (60.895 kg)  BMI 24.77 kg/m2  SpO2 98%  LMP 07/08/2013      Objective:   Physical Exam  Constitutional: She is oriented to person, place, and time. She appears well-developed and well-nourished. No distress.  HENT:  Head: Normocephalic and atraumatic.  Right Ear: Tympanic membrane and ear canal normal.  Left Ear: Tympanic membrane and ear canal normal.  Shallow ulcerated lesions noted on right hard palate. Some cobblestoning noted posterior oropharynx.  Cardiovascular: Normal rate and regular rhythm.   No murmur heard. Pulmonary/Chest: Effort normal and breath sounds normal. No respiratory distress. She has no wheezes. She has no rales. She exhibits no tenderness.  Musculoskeletal: She exhibits no edema.  Lymphadenopathy:    She has cervical adenopathy.  Neurological: She is alert and oriented to person, place, and time.  Skin: Skin is warm and dry.  Psychiatric: She has a normal mood and affect. Her behavior is normal. Judgment and thought  content normal.          Assessment & Plan:

## 2013-08-06 NOTE — Assessment & Plan Note (Addendum)
New. Suspect viral etiology. Discussed empiric valtrex.  She prefers to check HSV titers prior to rx with valtrex.  She denies hx of cold sores.  Recommended tylenol prn pain. Rx provided for magic mouthwash.

## 2013-08-06 NOTE — Telephone Encounter (Signed)
Pharmacy called and states they need the concentration ratio for the Magic Mouthwash w/lidocaine & must dis #237 mL container/SLS Please Advise.

## 2013-08-06 NOTE — Telephone Encounter (Signed)
Spoke with pharmacist and she stated that [employer] comes with: (diphenhydramine, hydrocortisone, nystatin), Ok per provider add Lidocaine 2%, 80 mL; with Sig: Swish, gargle, and spit one to two teaspoonfuls every six hours as needed. #237 ml container, stated understood & agreed/SLS

## 2013-08-07 LAB — HSV 2 ANTIBODY, IGG: HSV 2 Glycoprotein G Ab, IgG: 0.1 IV

## 2013-08-07 LAB — HSV(HERPES SIMPLEX VRS) I + II AB-IGM: Herpes Simplex Vrs I&II-IgM Ab (EIA): 0.48 INDEX

## 2013-08-07 LAB — HSV 1 ANTIBODY, IGG: HSV 1 Glycoprotein G Ab, IgG: 7.04 IV — ABNORMAL HIGH

## 2013-08-08 ENCOUNTER — Encounter: Payer: Self-pay | Admitting: Family

## 2013-08-08 ENCOUNTER — Telehealth: Payer: Self-pay | Admitting: Family

## 2013-08-08 DIAGNOSIS — B009 Herpesviral infection, unspecified: Secondary | ICD-10-CM

## 2013-08-08 HISTORY — DX: Herpesviral infection, unspecified: B00.9

## 2013-08-08 MED ORDER — VALACYCLOVIR HCL 1 G PO TABS: ORAL_TABLET | ORAL | Status: DC

## 2013-08-08 NOTE — Telephone Encounter (Signed)
Lab work confirms herpes type I.  I have sent valtrex to her pharmacy.   I think this will help the sores on the roof of her mouth.

## 2013-08-09 NOTE — Telephone Encounter (Signed)
Left message on home # to return my call. 

## 2013-08-09 NOTE — Telephone Encounter (Signed)
Pt left message returning my call. Attempted to reach pt and left message to call me back and let me know if it is ok to leave a detailed message on her voicemail.

## 2013-08-12 ENCOUNTER — Telehealth: Payer: Self-pay | Admitting: *Deleted

## 2013-08-12 NOTE — Telephone Encounter (Signed)
Pt left message on Friday returning my call and that it is ok to leave a detailed message on her voicemail.  Left message re: results and Rx and to call if any further questions.

## 2013-08-12 NOTE — Telephone Encounter (Signed)
Did not pick up mouth wash due to cost ($50). Feels that mouth sores are healing on their own but pt will pick up valtrex. Now has drainage with discolored phlegm, no cough. Pt reports that symptoms are improving. Pt has scheduled follow up with Dr Abner Greenspan on Tuesday and will keep appt with her.

## 2013-08-20 ENCOUNTER — Other Ambulatory Visit (HOSPITAL_COMMUNITY)
Admission: RE | Admit: 2013-08-20 | Discharge: 2013-08-20 | Disposition: A | Discharge status: Home / Self Care | Payer: 59 | Source: Ambulatory Visit | Attending: Family Medicine | Admitting: Family Medicine

## 2013-08-20 ENCOUNTER — Telehealth: Payer: Self-pay

## 2013-08-20 ENCOUNTER — Ambulatory Visit (HOSPITAL_BASED_OUTPATIENT_CLINIC_OR_DEPARTMENT_OTHER)
Admission: RE | Admit: 2013-08-20 | Discharge: 2013-08-20 | Disposition: A | Discharge status: Home / Self Care | Payer: 59 | Source: Ambulatory Visit | Attending: Family Medicine | Admitting: Family Medicine

## 2013-08-20 ENCOUNTER — Ambulatory Visit (INDEPENDENT_AMBULATORY_CARE_PROVIDER_SITE_OTHER): Payer: 59 | Admitting: Family Medicine

## 2013-08-20 ENCOUNTER — Other Ambulatory Visit: Payer: Self-pay | Admitting: Family Medicine

## 2013-08-20 ENCOUNTER — Encounter: Payer: Self-pay | Admitting: Family Medicine

## 2013-08-20 VITALS — BP 110/78 | HR 74 | Temp 97.9°F | Ht 61.75 in | Wt 134.0 lb

## 2013-08-20 DIAGNOSIS — F411 Generalized anxiety disorder: Secondary | ICD-10-CM

## 2013-08-20 DIAGNOSIS — R946 Abnormal results of thyroid function studies: Secondary | ICD-10-CM

## 2013-08-20 DIAGNOSIS — E079 Disorder of thyroid, unspecified: Secondary | ICD-10-CM

## 2013-08-20 DIAGNOSIS — R7989 Other specified abnormal findings of blood chemistry: Secondary | ICD-10-CM

## 2013-08-20 DIAGNOSIS — N760 Acute vaginitis: Secondary | ICD-10-CM

## 2013-08-20 DIAGNOSIS — N83209 Unspecified ovarian cyst, unspecified side: Secondary | ICD-10-CM

## 2013-08-20 DIAGNOSIS — N946 Dysmenorrhea, unspecified: Secondary | ICD-10-CM

## 2013-08-20 DIAGNOSIS — R002 Palpitations: Secondary | ICD-10-CM

## 2013-08-20 DIAGNOSIS — F419 Anxiety disorder, unspecified: Secondary | ICD-10-CM

## 2013-08-20 DIAGNOSIS — N390 Urinary tract infection, site not specified: Secondary | ICD-10-CM

## 2013-08-20 NOTE — Telephone Encounter (Signed)
Patient left a message stating that she just found out her insurance will not cover the ultrasound? Pt would like to know if she could wait until the labs come back to see if MD still feels that this is necessary?  Please advise?

## 2013-08-20 NOTE — Patient Instructions (Signed)

## 2013-08-20 NOTE — Assessment & Plan Note (Signed)
Has had a change in her menstrual cycle this past couple of months, 1-2 days of excessively heavy bleeding to the point that she cannot leave the house because she immediately bleeds through her tampons. Has seen gyn in the past, may need to return there for possible ablation

## 2013-08-20 NOTE — Telephone Encounter (Signed)
OK can hold on ultrasound for now and then if abnormal we might proceed if normal will wait

## 2013-08-20 NOTE — Telephone Encounter (Signed)
Patient informed and voiced understanding

## 2013-08-20 NOTE — Progress Notes (Signed)
Patient ID: Jackie [name], female   DOB: 11/17/1970, 42 y.o.   MRN: [id number] Jackie [name] [id number] July 18, 1971 08/20/2013      Progress Note-Follow Up  Subjective  Chief Complaint  Chief Complaint  Patient presents with  . Follow-up    HPI  Patient is a 42 year old female who is in today for followup. She continues to struggle with intermittent abdominal pain although it is better. She has added probiotics and that has been helpful. She does move her bowels daily. She was in the process of getting ready to have a hysterectomy with GYN but has chosen to put on hold. No recent fevers or chills. Does have some urinary frequency but no significant dysuria. No fevers or chills. Denies anorexia, nausea vomiting. No chest pain, palpitations or shortness of breath. Continues to have significant stress with a difficult marriage and her recent move but is otherwise doing well.  Past Medical History  Diagnosis Date  . Vitamin D deficiency 2012  . Chicken pox 42 yrs old  . Thyroid dysfunction 07/22/2011  . Anemia     during pregnancy  . Fibrocystic breast 07/22/2011  . Pleurisy 08/29/2011  . Dysmenorrhea 10/20/2011  . Vaginitis 07/15/2012  . Endometriosis 12/22/2012  . Hyponatremia 12/22/2012  . Tinea versicolor 03/07/2013  . Preventative health care 03/07/2013  . 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 No pertinent past surgical history.  Family History  Problem Relation Age of Onset  . Diabetes Mother     type 2  . Stroke Mother   . Osteoporosis Mother   . Heart disease Mother     angina  . Heart attack Father     MI in his 41s  . Diabetes Father     type 2  . Obesity Sister   . Diabetes Sister   . Obesity Brother   . Diabetes Brother     type 2  . Aneurysm Maternal Grandmother   . Heart attack Maternal Grandfather   . Pneumonia Paternal Grandfather   . Hypertension Sister     induced  . Other Sister     anemic  . Anemia Sister   .  Depression Sister     History   Social History  . Marital Status: Married    Spouse Name: N/A    Number of Children: 1  . Years of Education: N/A   Occupational History  . Not on file.   Social History Main Topics  . Smoking status: Never Smoker   . Smokeless tobacco: Never Used  . Alcohol Use: Yes     Comment: 1 to 2 glasses 5 times a week. but then the next week maybe nothing  . Drug Use: No  . Sexual Activity: Yes    Partners: Male   Other Topics Concern  . Not on file   Social History Narrative  . No narrative on file    Current Outpatient Prescriptions on File Prior to Visit  Medication Sig Dispense Refill  . Cholecalciferol (VITAMIN D) 1000 UNITS capsule Take 2,500 Units by mouth daily.        Marland Kitchen loratadine (CLARITIN) 10 MG tablet 1 tab po bid prn urticaria  30 tablet  11  . Multiple Vitamin (MULTIVITAMIN) tablet Take 1 tablet by mouth daily.        . valACYclovir (VALTREX) 1000 MG tablet 2 tabs by mouth now.  Take 2 more tabs by mouth in 12 hours  4 tablet  2  .  Alum & Mag Hydroxide-Simeth (MAGIC MOUTHWASH W/LIDOCAINE) SOLN Take 5 mLs by mouth 3 (three) times daily as needed for mouth pain.  150 mL  0   No current facility-administered medications on file prior to visit.    Allergies  Allergen Reactions  . Nizoral [Ketoconazole]     Burning and dry skin    Review of Systems  Review of Systems  Constitutional: Negative for fever and malaise/fatigue.  HENT: Negative for congestion.   Eyes: Negative for discharge.  Respiratory: Negative for shortness of breath.   Cardiovascular: Negative for chest pain, palpitations and leg swelling.  Gastrointestinal: Negative for nausea, abdominal pain and diarrhea.  Genitourinary: Negative for dysuria.  Musculoskeletal: Negative for falls.  Skin: Negative for rash.  Neurological: Negative for loss of consciousness and headaches.  Endo/Heme/Allergies: Negative for polydipsia.  Psychiatric/Behavioral: Negative for  depression and suicidal ideas. The patient is not nervous/anxious and does not have insomnia.     Objective  BP 110/78  Pulse 74  Temp(Src) 97.9 F (36.6 C) (Oral)  Ht 5' 1.75" (1.568 m)  Wt 134 lb (60.782 kg)  BMI 24.72 kg/m2  SpO2 97%  LMP 07/08/2013  Physical Exam  Physical Exam  Constitutional: She is oriented to person, place, and time and well-developed, well-nourished, and in no distress. No distress.  HENT:  Head: Normocephalic and atraumatic.  Eyes: Conjunctivae are normal.  Neck: Neck supple. No thyromegaly present.  Cardiovascular: Normal rate, regular rhythm and normal heart sounds.   No murmur heard. Pulmonary/Chest: Effort normal and breath sounds normal. She has no wheezes.  Abdominal: She exhibits no distension and no mass.  Musculoskeletal: She exhibits no edema.  Lymphadenopathy:    She has no cervical adenopathy.  Neurological: She is alert and oriented to person, place, and time.  Skin: Skin is warm and dry. No rash noted. She is not diaphoretic.  Psychiatric: Memory, affect and judgment normal.    Lab Results  Component Value Date   TSH 5.173* 02/28/2013   Lab Results  Component Value Date   WBC 7.9 05/22/2013   HGB 13.5 05/22/2013   HCT 39.5 05/22/2013   MCV 93.4 05/22/2013   PLT 263 05/22/2013   Lab Results  Component Value Date   CREATININE 0.65 02/28/2013   BUN 11 02/28/2013   NA 136 02/28/2013   K 4.2 02/28/2013   CL 102 02/28/2013   CO2 25 02/28/2013   Lab Results  Component Value Date   ALT 12 02/28/2013   AST 19 02/28/2013   ALKPHOS 50 02/28/2013   BILITOT 0.5 02/28/2013   Lab Results  Component Value Date   CHOL 197 02/28/2013   Lab Results  Component Value Date   HDL 86 02/28/2013   Lab Results  Component Value Date   LDLCALC 87 02/28/2013   Lab Results  Component Value Date   TRIG 122 02/28/2013   Lab Results  Component Value Date   CHOLHDL 2.3 02/28/2013     Assessment & Plan  Dysmenorrhea Has had a change in her menstrual  cycle this past couple of months, 1-2 days of excessively heavy bleeding to the point that she cannot leave the house because she immediately bleeds through her tampons. Has seen gyn in the past, may need to return there for possible ablation  Anxiety disorder Still struggling with a tough marriage but she feels she is doing better at this time  UTI (lower urinary tract infection) Urine culture negative  Thyroid dysfunction Thyroid studies all normal  tody  Palpitations Infrequent and without associated symptoms  Ovarian cyst Continues to have intermittent abdominal pain has chosen not proceed with TAH with GYN yet but may proceed in future.

## 2013-08-20 NOTE — Progress Notes (Signed)
Pre visit review using our clinic review tool, if applicable. No additional management support is needed unless otherwise documented below in the visit note. 

## 2013-08-21 LAB — URINALYSIS
Bilirubin Urine: NEGATIVE
Glucose, UA: NEGATIVE mg/dL
Hgb urine dipstick: NEGATIVE
Ketones, ur: NEGATIVE mg/dL
Leukocytes, UA: NEGATIVE
Nitrite: NEGATIVE
Protein, ur: NEGATIVE mg/dL
Specific Gravity, Urine: 1.019 (ref 1.005–1.030)
Urobilinogen, UA: 0.2 mg/dL (ref 0.0–1.0)
pH: 6 (ref 5.0–8.0)

## 2013-08-21 LAB — URINE CULTURE
Colony Count: NO GROWTH
Organism ID, Bacteria: NO GROWTH

## 2013-08-21 LAB — TSH: TSH: 2.91 u[IU]/mL (ref 0.350–4.500)

## 2013-08-21 LAB — T4, FREE: Free T4: 1.04 ng/dL (ref 0.80–1.80)

## 2013-08-21 NOTE — Assessment & Plan Note (Signed)
Still struggling with a tough marriage but she feels she is doing better at this time

## 2013-08-21 NOTE — Assessment & Plan Note (Signed)
Continues to have intermittent abdominal pain has chosen not proceed with TAH with GYN yet but may proceed in future.

## 2013-08-21 NOTE — Assessment & Plan Note (Signed)
Urine culture negative.

## 2013-08-21 NOTE — Assessment & Plan Note (Signed)
Infrequent and without associated symptoms 

## 2013-08-21 NOTE — Assessment & Plan Note (Signed)
Thyroid studies all normal tody

## 2013-08-22 LAB — URINALYSIS
Bilirubin Urine: NEGATIVE
Glucose, UA: NEGATIVE mg/dL
Hgb urine dipstick: NEGATIVE
Ketones, ur: NEGATIVE mg/dL
Leukocytes, UA: NEGATIVE
Nitrite: NEGATIVE
Protein, ur: NEGATIVE mg/dL
Specific Gravity, Urine: 1.019 (ref 1.005–1.030)
Urobilinogen, UA: 0.2 mg/dL (ref 0.0–1.0)
pH: 6 (ref 5.0–8.0)

## 2013-08-22 NOTE — Telephone Encounter (Signed)
Labs all normal. Ultrasound is up to her. May proceed  Now or wait and see how she feels in a month

## 2013-08-22 NOTE — Telephone Encounter (Signed)
Patient called stating that she looked at her results online but isn't sure if interpreting correctly, request your advice on the results & if the canceled [place] is needed/SLS Please Advise.

## 2013-08-22 NOTE — Telephone Encounter (Signed)
Left a detailed message on answering machine

## 2013-08-23 ENCOUNTER — Ambulatory Visit (HOSPITAL_BASED_OUTPATIENT_CLINIC_OR_DEPARTMENT_OTHER): Payer: 59

## 2013-09-03 ENCOUNTER — Other Ambulatory Visit: Payer: Self-pay

## 2013-09-03 DIAGNOSIS — Z1231 Encounter for screening mammogram for malignant neoplasm of breast: Secondary | ICD-10-CM

## 2013-10-08 ENCOUNTER — Ambulatory Visit
Admission: RE | Admit: 2013-10-08 | Discharge: 2013-10-08 | Disposition: A | Discharge status: Home / Self Care | Payer: 59 | Source: Ambulatory Visit

## 2013-10-08 DIAGNOSIS — Z1231 Encounter for screening mammogram for malignant neoplasm of breast: Secondary | ICD-10-CM

## 2013-11-17 HISTORY — PX: DENTAL SURGERY: SHX609

## 2013-12-09 NOTE — Patient Instructions (Addendum)
   Your procedure is scheduled on: Tuesday, Mar 31  [facility] at: 1030 AM Pick up the phone at the desk and dial [phone] and inform us of your arrival.  Please call this number if you have any problems the morning of surgery: [phone]  Remember: Do not eat food after midnight: Monday Do not drink clear liquids after: 8 AM Tuesday, day of surgery Take these medicines the morning of surgery with a SIP OF WATER: None  Do not wear jewelry, make-up, or FINGER nail polish No metal in your hair or on your body. Do not wear lotions, powders, perfumes.  You may wear deodorant.  Do not bring valuables to the hospital. Contacts, dentures or bridgework may not be worn into surgery.  Leave suitcase in the car. After Surgery it may be brought to your room. For patients being admitted to the hospital, checkout time is 11:00am the day of discharge.  Home with husband Jonny RuizJohn cell [phone].

## 2013-12-10 ENCOUNTER — Encounter (HOSPITAL_COMMUNITY)
Admission: RE | Admit: 2013-12-10 | Discharge: 2013-12-10 | Disposition: A | Discharge status: Home / Self Care | Payer: 59 | Source: Ambulatory Visit | Attending: Obstetrics and Gynecology | Admitting: Obstetrics and Gynecology

## 2013-12-10 ENCOUNTER — Encounter (HOSPITAL_COMMUNITY): Payer: Self-pay

## 2013-12-10 DIAGNOSIS — Z01818 Encounter for other preprocedural examination: Secondary | ICD-10-CM | POA: Insufficient documentation

## 2013-12-10 HISTORY — DX: Headache: R51

## 2013-12-10 HISTORY — DX: Missed abortion: O02.1

## 2013-12-15 NOTE — H&P (Signed)
Jackie [name] is an 43 y.o. female G2P1011 who presents for a scheduled hysterectomy for a long-standing problem with dysmenorrhea and menorrhagia.  The patient has really struggled for about 2-3 years with heavy cycles every 30-35 days.  Pain and bleeding so severe will usually be house-bound for 2-3 days.  Floods through pads and tampons.  She has had an extensive work up over the years with 3 endometrial biopsies that were all benign, performed annually in preparation for laparoscopic hysterectomy that patient would then need to delay.  An US shows no fibroids, just probable adenomyosis in 10/2013 and her pap smear was WNL 6/14 with negative HPV testing.  The patient wants definitive therapy at this time.  Pertinent Gynecological History: Menses: flow is excessive with use of 10 pads or tampons on heaviest days  Last pap: normal Date: 6/14 with negative HPV OB History:NSVD x 1, SAB x 1   Menstrual History:  No LMP recorded.    Past Medical History  Diagnosis Date  . Vitamin D deficiency 2012  . Chicken pox 43 yrs old  . Thyroid dysfunction 07/22/2011    history - resolved, no current problem  . Anemia     during pregnancy  . Fibrocystic breast 07/22/2011  . Pleurisy 08/29/2011    History - resolved  . Dysmenorrhea 10/20/2011  . Vaginitis 07/15/2012  . Endometriosis 12/22/2012  . Hyponatremia 12/22/2012  . Tinea versicolor 03/07/2013  . Preventative health care 03/07/2013  . 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 SVD (spontaneous vaginal delivery)     x 1  . Missed abortion     resolved on it's own - no surgery required.  Marland Kitchen Headache(784.0)     otc med prn    Past Surgical History  Procedure Laterality Date  . Dental surgery  11/2013    Family History  Problem Relation Age of Onset  . Diabetes Mother     type 2  . Stroke Mother   . Osteoporosis Mother   . Heart disease Mother     angina  . Heart attack Father     MI in his 45s  . Diabetes Father     type 2   . Obesity Sister   . Diabetes Sister   . Obesity Brother   . Diabetes Brother     type 2  . Aneurysm Maternal Grandmother   . Heart attack Maternal Grandfather   . Pneumonia Paternal Grandfather   . Hypertension Sister     induced  . Other Sister     anemic  . Anemia Sister   . Depression Sister     Social History:  reports that she has never smoked. She has never used smokeless tobacco. She reports that she drinks alcohol. She reports that she does not use illicit drugs.  Allergies:  Allergies  Allergen Reactions  . Nizoral [Ketoconazole]     Burning and dry skin    No prescriptions prior to admission    ROS  There were no vitals taken for this visit. Physical Exam  Constitutional: She is oriented to person, place, and time. She appears well-developed and well-nourished.  Cardiovascular: Normal rate and regular rhythm.   Respiratory: Effort normal and breath sounds normal.  GI: Soft.  Genitourinary: Vagina normal and uterus normal.  Neurological: She is alert and oriented to person, place, and time.  Psychiatric: She has a normal mood and affect.    No results found for this or any previous visit (from  the past 24 hour(s)).  No results found.  Assessment/Plan: The risks of laparoscopic supracervical hysterectomy were d/w the patient in detail. We discussed the risk of bleeding, infection, and possible damage to surrounding organs bladder, bowel, and ureters. The patient understands that we may need to convert to an open procedure if any complication arises and that may delay her healing. We discussed the use of the morcellator extensively and the risk of undetected cancer of 1/350 which could be present and if morcellated would possibly worsen survival outcome. The patient would like to retain her cervix and her ovaries. We will remove the fallopian tubes. The patient meets the hospital requirements for safe power morcellation given no fibroid pathology and age 49<45  y/o.  She has had the necessary screening with EMB, US, and pap smear.  The patient is ready to proceed.   Oliver PilaRICHARDSON,Contrell Ballentine W 12/15/2013, 11:54 AM

## 2013-12-17 ENCOUNTER — Ambulatory Visit (HOSPITAL_COMMUNITY)
Admission: RE | Admit: 2013-12-17 | Discharge: 2013-12-18 | Disposition: A | Discharge status: Home / Self Care | Payer: 59 | Source: Ambulatory Visit | Attending: Obstetrics and Gynecology | Admitting: Obstetrics and Gynecology

## 2013-12-17 ENCOUNTER — Encounter (HOSPITAL_COMMUNITY): Payer: Self-pay | Admitting: Anesthesiology

## 2013-12-17 ENCOUNTER — Encounter (HOSPITAL_COMMUNITY): Payer: 59 | Admitting: Anesthesiology

## 2013-12-17 ENCOUNTER — Ambulatory Visit (HOSPITAL_COMMUNITY): Payer: 59 | Admitting: Anesthesiology

## 2013-12-17 ENCOUNTER — Encounter (HOSPITAL_COMMUNITY)
Admission: RE | Disposition: A | Discharge status: Home / Self Care | Payer: Self-pay | Source: Ambulatory Visit | Attending: Obstetrics and Gynecology

## 2013-12-17 DIAGNOSIS — D649 Anemia, unspecified: Secondary | ICD-10-CM | POA: Insufficient documentation

## 2013-12-17 DIAGNOSIS — N920 Excessive and frequent menstruation with regular cycle: Secondary | ICD-10-CM | POA: Insufficient documentation

## 2013-12-17 DIAGNOSIS — N8000 Endometriosis of the uterus, unspecified: Secondary | ICD-10-CM | POA: Insufficient documentation

## 2013-12-17 DIAGNOSIS — Z90711 Acquired absence of uterus with remaining cervical stump: Secondary | ICD-10-CM | POA: Diagnosis present

## 2013-12-17 DIAGNOSIS — N838 Other noninflammatory disorders of ovary, fallopian tube and broad ligament: Secondary | ICD-10-CM | POA: Insufficient documentation

## 2013-12-17 DIAGNOSIS — N946 Dysmenorrhea, unspecified: Secondary | ICD-10-CM | POA: Insufficient documentation

## 2013-12-17 HISTORY — PX: LAPAROSCOPIC SUPRACERVICAL HYSTERECTOMY: SHX5399

## 2013-12-17 LAB — CBC
HCT: 38.2 % (ref 36.0–46.0)
Hemoglobin: 13.2 g/dL (ref 12.0–15.0)
MCH: 32.4 pg (ref 26.0–34.0)
MCHC: 34.6 g/dL (ref 30.0–36.0)
MCV: 93.6 fL (ref 78.0–100.0)
Platelets: 233 10*3/uL (ref 150–400)
RBC: 4.08 MIL/uL (ref 3.87–5.11)
RDW: 12.3 % (ref 11.5–15.5)
WBC: 5.8 10*3/uL (ref 4.0–10.5)

## 2013-12-17 LAB — PREGNANCY, URINE: Preg Test, Ur: NEGATIVE

## 2013-12-17 LAB — TYPE AND SCREEN
ABO/RH(D): O POS
Antibody Screen: NEGATIVE

## 2013-12-17 LAB — ABO/RH: ABO/RH(D): O POS

## 2013-12-17 SURGERY — HYSTERECTOMY, SUPRACERVICAL, LAPAROSCOPIC
Anesthesia: General | Site: Abdomen

## 2013-12-17 MED ORDER — PROPOFOL 10 MG/ML IV BOLUS
INTRAVENOUS | Status: DC | PRN
  Administered 2013-12-17: 170 mg via INTRAVENOUS

## 2013-12-17 MED ORDER — ROCURONIUM BROMIDE 100 MG/10ML IV SOLN
INTRAVENOUS | Status: DC | PRN
  Administered 2013-12-17: 40 mg via INTRAVENOUS

## 2013-12-17 MED ORDER — ROCURONIUM BROMIDE 100 MG/10ML IV SOLN
INTRAVENOUS | Status: AC
  Filled 2013-12-17: qty 1

## 2013-12-17 MED ORDER — FENTANYL CITRATE 0.05 MG/ML IJ SOLN
INTRAMUSCULAR | Status: AC
  Filled 2013-12-17: qty 5

## 2013-12-17 MED ORDER — KETOROLAC TROMETHAMINE 30 MG/ML IJ SOLN
INTRAMUSCULAR | Status: DC | PRN
  Administered 2013-12-17: 30 mg via INTRAVENOUS

## 2013-12-17 MED ORDER — KETOROLAC TROMETHAMINE 30 MG/ML IJ SOLN
30.0000 mg | Freq: Four times a day (QID) | INTRAMUSCULAR | Status: AC
  Administered 2013-12-17 (×2): 30 mg via INTRAVENOUS
  Filled 2013-12-17 (×2): qty 1

## 2013-12-17 MED ORDER — SCOPOLAMINE 1 MG/3DAYS TD PT72
MEDICATED_PATCH | TRANSDERMAL | Status: AC
  Administered 2013-12-17: 1.5 mg via TRANSDERMAL
  Filled 2013-12-17: qty 1

## 2013-12-17 MED ORDER — NEOSTIGMINE METHYLSULFATE 1 MG/ML IJ SOLN
INTRAMUSCULAR | Status: DC | PRN
  Administered 2013-12-17: 3 mg via INTRAVENOUS

## 2013-12-17 MED ORDER — MIDAZOLAM HCL 2 MG/2ML IJ SOLN
INTRAMUSCULAR | Status: DC | PRN
  Administered 2013-12-17: 2 mg via INTRAVENOUS

## 2013-12-17 MED ORDER — CEFAZOLIN SODIUM-DEXTROSE 2-3 GM-% IV SOLR
2.0000 g | INTRAVENOUS | Status: AC
  Administered 2013-12-17: 2 g via INTRAVENOUS

## 2013-12-17 MED ORDER — HYDROMORPHONE HCL PF 1 MG/ML IJ SOLN
0.2000 mg | INTRAMUSCULAR | Status: DC | PRN
  Administered 2013-12-17 (×2): 0.6 mg via INTRAVENOUS
  Filled 2013-12-17 (×2): qty 1

## 2013-12-17 MED ORDER — SIMETHICONE 80 MG PO CHEW
80.0000 mg | CHEWABLE_TABLET | Freq: Four times a day (QID) | ORAL | Status: DC | PRN
  Filled 2013-12-17: qty 1

## 2013-12-17 MED ORDER — GLYCOPYRROLATE 0.2 MG/ML IJ SOLN
INTRAMUSCULAR | Status: DC | PRN
  Administered 2013-12-17: 0.6 mg via INTRAVENOUS

## 2013-12-17 MED ORDER — LIDOCAINE HCL (CARDIAC) 20 MG/ML IV SOLN
INTRAVENOUS | Status: AC
  Filled 2013-12-17: qty 5

## 2013-12-17 MED ORDER — GLYCOPYRROLATE 0.2 MG/ML IJ SOLN
INTRAMUSCULAR | Status: AC
  Filled 2013-12-17: qty 3

## 2013-12-17 MED ORDER — BUPIVACAINE HCL (PF) 0.25 % IJ SOLN
INTRAMUSCULAR | Status: AC
  Filled 2013-12-17: qty 30

## 2013-12-17 MED ORDER — LACTATED RINGERS IR SOLN
Status: DC | PRN
  Administered 2013-12-17: 3000 mL

## 2013-12-17 MED ORDER — CEFAZOLIN SODIUM-DEXTROSE 2-3 GM-% IV SOLR
INTRAVENOUS | Status: AC
  Filled 2013-12-17: qty 50

## 2013-12-17 MED ORDER — BUPIVACAINE HCL (PF) 0.25 % IJ SOLN
INTRAMUSCULAR | Status: DC | PRN
  Administered 2013-12-17: 10 mL

## 2013-12-17 MED ORDER — METOCLOPRAMIDE HCL 5 MG/ML IJ SOLN: 10.0000 mg | Freq: Once | INTRAMUSCULAR | Status: DC | PRN

## 2013-12-17 MED ORDER — NEOSTIGMINE METHYLSULFATE 1 MG/ML IJ SOLN
INTRAMUSCULAR | Status: AC
  Filled 2013-12-17: qty 1

## 2013-12-17 MED ORDER — PROPOFOL 10 MG/ML IV EMUL
INTRAVENOUS | Status: AC
  Filled 2013-12-17: qty 20

## 2013-12-17 MED ORDER — IBUPROFEN 600 MG PO TABS
600.0000 mg | ORAL_TABLET | Freq: Four times a day (QID) | ORAL | Status: DC | PRN
  Administered 2013-12-18: 600 mg via ORAL
  Filled 2013-12-17: qty 1

## 2013-12-17 MED ORDER — HYDROMORPHONE HCL PF 1 MG/ML IJ SOLN: 0.2500 mg | INTRAMUSCULAR | Status: DC | PRN

## 2013-12-17 MED ORDER — ONDANSETRON HCL 4 MG/2ML IJ SOLN
4.0000 mg | Freq: Four times a day (QID) | INTRAMUSCULAR | Status: DC | PRN
Start: 2013-12-17 — End: 2013-12-18

## 2013-12-17 MED ORDER — ONDANSETRON HCL 4 MG PO TABS: 4.0000 mg | ORAL_TABLET | Freq: Four times a day (QID) | ORAL | Status: DC | PRN

## 2013-12-17 MED ORDER — MIDAZOLAM HCL 2 MG/2ML IJ SOLN
INTRAMUSCULAR | Status: AC
  Filled 2013-12-17: qty 2

## 2013-12-17 MED ORDER — LACTATED RINGERS IV SOLN
INTRAVENOUS | Status: DC
  Administered 2013-12-17 (×2): via INTRAVENOUS

## 2013-12-17 MED ORDER — OXYCODONE-ACETAMINOPHEN 5-325 MG PO TABS
1.0000 | ORAL_TABLET | ORAL | Status: DC | PRN
  Administered 2013-12-18: 1 via ORAL
  Administered 2013-12-18: 2 via ORAL
  Filled 2013-12-17: qty 1
  Filled 2013-12-17: qty 2

## 2013-12-17 MED ORDER — DEXAMETHASONE SODIUM PHOSPHATE 10 MG/ML IJ SOLN
INTRAMUSCULAR | Status: AC
  Filled 2013-12-17: qty 1

## 2013-12-17 MED ORDER — ONDANSETRON HCL 4 MG/2ML IJ SOLN
INTRAMUSCULAR | Status: DC | PRN
  Administered 2013-12-17: 4 mg via INTRAVENOUS

## 2013-12-17 MED ORDER — LACTATED RINGERS IV SOLN
INTRAVENOUS | Status: DC
  Administered 2013-12-17 (×2): via INTRAVENOUS

## 2013-12-17 MED ORDER — FENTANYL CITRATE 0.05 MG/ML IJ SOLN
INTRAMUSCULAR | Status: DC | PRN
  Administered 2013-12-17 (×5): 50 ug via INTRAVENOUS

## 2013-12-17 MED ORDER — SCOPOLAMINE 1 MG/3DAYS TD PT72
1.0000 | MEDICATED_PATCH | TRANSDERMAL | Status: DC
  Administered 2013-12-17: 1.5 mg via TRANSDERMAL

## 2013-12-17 MED ORDER — DEXAMETHASONE SODIUM PHOSPHATE 10 MG/ML IJ SOLN
INTRAMUSCULAR | Status: DC | PRN
  Administered 2013-12-17: 10 mg via INTRAVENOUS

## 2013-12-17 MED ORDER — ONDANSETRON HCL 4 MG/2ML IJ SOLN
INTRAMUSCULAR | Status: AC
  Filled 2013-12-17: qty 2

## 2013-12-17 MED ORDER — KETOROLAC TROMETHAMINE 30 MG/ML IJ SOLN: 30.0000 mg | Freq: Four times a day (QID) | INTRAMUSCULAR | Status: AC

## 2013-12-17 MED ORDER — LIDOCAINE HCL (CARDIAC) 20 MG/ML IV SOLN
INTRAVENOUS | Status: DC | PRN
  Administered 2013-12-17: 20 mg via INTRAVENOUS
  Administered 2013-12-17: 70 mg via INTRAVENOUS

## 2013-12-17 MED ORDER — MEPERIDINE HCL 25 MG/ML IJ SOLN: 6.2500 mg | INTRAMUSCULAR | Status: DC | PRN

## 2013-12-17 SURGICAL SUPPLY — 30 items
BARRIER ADHS 3X4 INTERCEED (GAUZE/BANDAGES/DRESSINGS) ×3 IMPLANT
BLADE LAP MORCELLATOR 15MMX9.5 (ELECTROSURGICAL) ×1
BLADE LAP MORCELLATOR 15X9.5 (ELECTROSURGICAL) ×2 IMPLANT
CABLE HIGH FREQUENCY MONO STRZ (ELECTRODE) IMPLANT
CHLORAPREP W/TINT 26ML (MISCELLANEOUS) ×3 IMPLANT
CLOTH BEACON ORANGE TIMEOUT ST (SAFETY) ×3 IMPLANT
COVER MAYO STAND STRL (DRAPES) ×3 IMPLANT
DERMABOND ADVANCED (GAUZE/BANDAGES/DRESSINGS) ×2
DERMABOND ADVANCED .7 DNX12 (GAUZE/BANDAGES/DRESSINGS) ×1 IMPLANT
EVACUATOR SMOKE 8.L (FILTER) ×6 IMPLANT
GLOVE BIO SURGEON STRL SZ 6.5 (GLOVE) ×2 IMPLANT
GLOVE BIO SURGEON STRL SZ8 (GLOVE) ×3 IMPLANT
GLOVE BIO SURGEONS STRL SZ 6.5 (GLOVE) ×1
GLOVE ORTHO TXT STRL SZ7.5 (GLOVE) ×3 IMPLANT
GOWN STRL REUS W/TWL LRG LVL3 (GOWN DISPOSABLE) ×9 IMPLANT
NEEDLE INSUFFLATION 120MM (ENDOMECHANICALS) ×3 IMPLANT
NS IRRIG 1000ML POUR BTL (IV SOLUTION) ×3 IMPLANT
PACK LAPAROSCOPY BASIN (CUSTOM PROCEDURE TRAY) ×3 IMPLANT
SET IRRIG TUBING LAPAROSCOPIC (IRRIGATION / IRRIGATOR) ×3 IMPLANT
SHEARS HARMONIC ACE PLUS 36CM (ENDOMECHANICALS) IMPLANT
SUT VIC AB 3-0 PS2 18 (SUTURE) ×2
SUT VIC AB 3-0 PS2 18XBRD (SUTURE) ×1 IMPLANT
SUT VICRYL 0 UR6 27IN ABS (SUTURE) ×3 IMPLANT
TOWEL OR 17X24 6PK STRL BLUE (TOWEL DISPOSABLE) ×6 IMPLANT
TRAY FOLEY CATH 14FR (SET/KITS/TRAYS/PACK) ×3 IMPLANT
TROCAR BLADELESS OPT 12M 100M (ENDOMECHANICALS) ×3 IMPLANT
TROCAR XCEL NON-BLD 5MMX100MML (ENDOMECHANICALS) ×3 IMPLANT
TROCAR XCEL OPT SLVE 5M 100M (ENDOMECHANICALS) ×3 IMPLANT
WARMER LAPAROSCOPE (MISCELLANEOUS) ×3 IMPLANT
WATER STERILE IRR 1000ML POUR (IV SOLUTION) ×3 IMPLANT

## 2013-12-17 NOTE — Anesthesia Preprocedure Evaluation (Signed)
Anesthesia Evaluation  Patient identified by MRN, date of birth, ID band Patient awake    Reviewed: Allergy & Precautions, H&P , NPO status , Patient's Chart, lab work & pertinent test results  Airway Mallampati: II TM Distance: >3 FB Neck ROM: Full    Dental no notable dental hx. (+) Caps, Teeth Intact   Pulmonary neg pulmonary ROS,    Pulmonary exam normal       Cardiovascular negative cardio ROS  Rhythm:Regular Rate:Normal     Neuro/Psych  Headaches, PSYCHIATRIC DISORDERS    GI/Hepatic negative GI ROS, Neg liver ROS,   Endo/Other  negative endocrine ROSHx/o thyroid dysfunction  Renal/GU negative Renal ROS  negative genitourinary   Musculoskeletal negative musculoskeletal ROS (+)   Abdominal   Peds  Hematology  (+) anemia ,   Anesthesia Other Findings Hx/o recent dental surgery  Reproductive/Obstetrics Dysmenorrhea  Menorrhagia                           Anesthesia Physical Anesthesia Plan  ASA: II  Anesthesia Plan: General   Post-op Pain Management:    Induction: Intravenous  Airway Management Planned: Oral ETT  Additional Equipment:   Intra-op Plan:   Post-operative Plan: Extubation in OR  Informed Consent: I have reviewed the patients History and Physical, chart, labs and discussed the procedure including the risks, benefits and alternatives for the proposed anesthesia with the patient or authorized representative who has indicated his/her understanding and acceptance.   Dental advisory given  Plan Discussed with: Anesthesiologist, CRNA and Surgeon  Anesthesia Plan Comments:         Anesthesia Quick Evaluation

## 2013-12-17 NOTE — Progress Notes (Signed)
Patient ID: [name], female   DOB: 04-07-1971, 43 y.o.   MRN: [id number] Pt here for surgery and states no changes in dictated H&P.  Brief exam WNL.  Ready to proceed.

## 2013-12-17 NOTE — Addendum Note (Signed)
Addendum created 12/17/13 1605 by Renford DillsJanet L Srinidhi Landers, CRNA   Modules edited: Notes Section   Notes Section:  File: [id number]

## 2013-12-17 NOTE — Brief Op Note (Signed)
12/17/2013  1:26 PM  PATIENT:  Jackie FlackKausar Pickerel  43 y.o. female  PRE-OPERATIVE DIAGNOSIS:  Menorrhagia, dysmenorrhea  POST-OPERATIVE DIAGNOSIS:  Menorrhagia, dysmenorrhea  PROCEDURE:  Procedure(s) with comments: LAPAROSCOPIC  SUPRACERVICAL HYSTERECTOMY WITH BILATERAL SALPINGECTOMY (N/A) - 2hrs OR time  SURGEON:  Surgeon(s) and Role:    * Oliver PilaKathy W Estiven Kohan, MD - Primary    * Lavina Hammanodd Meisinger, MD - Assisting   ANESTHESIA:   local and general  EBL:  Total I/O In: 1000 [I.V.:1000] Out: 40 [Urine:40]  BLOOD ADMINISTERED:none  DRAINS: Urinary Catheter (Foley)   LOCAL MEDICATIONS USED:  LIDOCAINE   SPECIMEN: morcellated uterus and tubes  DISPOSITION OF SPECIMEN:  PATHOLOGY  COUNTS:  YES  TOURNIQUET:  * No tourniquets in log *  DICTATION: .Dragon Dictation  PLAN OF CARE: Admit for overnight observation  PATIENT DISPOSITION:  PACU - hemodynamically stable.

## 2013-12-17 NOTE — Anesthesia Postprocedure Evaluation (Signed)
  Anesthesia Post-op Note  Patient: [name]  Procedure(s) Performed: Procedure(s) with comments: LAPAROSCOPIC  SUPRACERVICAL HYSTERECTOMY WITH BILATERAL SALPINGECTOMY (N/A) - 2hrs OR time Patient is awake and responsive. Pain and nausea are reasonably well controlled. Vital signs are stable and clinically acceptable. Oxygen saturation is clinically acceptable. There are no apparent anesthetic complications at this time. Patient is ready for discharge.

## 2013-12-17 NOTE — Anesthesia Postprocedure Evaluation (Signed)
  Anesthesia Post-op Note  Patient: Jackie [name]  Procedure(s) Performed: Procedure(s) with comments: LAPAROSCOPIC  SUPRACERVICAL HYSTERECTOMY WITH BILATERAL SALPINGECTOMY (N/A) - 2hrs OR time  Patient Location: [facility]  Anesthesia Type:General  Level of Consciousness: awake  Airway and Oxygen Therapy: Patient Spontanous Breathing  Post-op Pain: mild  Post-op Assessment: Patient's Cardiovascular Status Stable and Respiratory Function Stable  Post-op Vital Signs: stable  Complications: No apparent anesthesia complications

## 2013-12-17 NOTE — Transfer of Care (Signed)
Immediate Anesthesia Transfer of Care Note  Patient: Jackie [name]  Procedure(s) Performed: Procedure(s) with comments: LAPAROSCOPIC  SUPRACERVICAL HYSTERECTOMY WITH BILATERAL SALPINGECTOMY (N/A) - 2hrs OR time  Patient Location: PACU  Anesthesia Type:General  Level of Consciousness: awake, sedated and unresponsive  Airway & Oxygen Therapy: Patient Spontanous Breathing and Patient connected to nasal cannula oxygen  Post-op Assessment: Report given to PACU RN and Post -op Vital signs reviewed and stable  Post vital signs: Reviewed and stable  Complications: No apparent anesthesia complications

## 2013-12-17 NOTE — Progress Notes (Signed)
Patient ID: [name], female   DOB: 1970-11-20, 43 y.o.   MRN: [id number] DOS VS normal Urine in the bag is dark yellow Pt is awake and alert No complaints

## 2013-12-17 NOTE — Op Note (Signed)
Operative note  Preoperative diagnosis Menorrhagia Dysmenorrhea Suspected adenomyosis  Postoperative diagnosis Same  Procedure Laparoscopic supracervical hysterectomy and bilateral salpingectomy  Surgeon Dr. Huel CoteKathy Bertran Zeimet Dr. Tawanna Coolerodd Meisinger  Anesthesia Gen.  Fluids Estimated blood loss 100 cc Urine output 100 cc clear urine IV fluids 1200 cc LR  Findings Uterus was normal in size and slightly boggy in texture. The ovaries appeared normal bilaterally as did the fallopian tubes. The remainder of the abdomen and pelvis were within normal limits  Specimen Morcellated uterus and fallopian tubes sent to pathology  Procedure note Patient was taken to the operating room where general anesthesia was obtained without difficulty. She was prepped and draped in the normal sterile fashion in the dorsal lithotomy position. An appropriate time out was performed. Attention was turned to the abdomen where the infraumbilical area was injected with quarter percent plain Marcaine. Scalpel was then utilized to make small incision and the varies needle introduced intraperitoneally. This placement was confirmed with injection aspiration normal saline and then gas flow was applied with a normal opening pressure of 5 noted pneumoperitoneum was then obtained with approximately 2 and half liters of CO2 gas. Once this was obtained the 5 mm Optiview trocar was utilized for direct entry into the abdomen. The pelvis and abdomen were inspected with findings as previously stated. 2 additional ports were then placed 5 mm in the upper right quadrant and a 12 mm in the upper left quadrant under direct visualization. Each port site was injected with quarter percent Marcaine prior placement. With all ports in place the patient's right fallopian tube was identified and grasped at its fimbriated end it was then held away from the ovary and sidewall and the harmonic scalpel utilized to dissect down to the level of the  uterine fundus separating the tube from the ovary and infundibulopelvic ligament. Once this was performed and the remainder of the round ligament and broad ligament were taken down with harmonic scalpel sequentially. The bladder flap was created as well with harmonic scalpel. The uterine artery was then cauterized and transected with harmonic scalpel with good hemostasis noted.  Attention was then turned to the left side which in a similar fashion had the fallopian tube dissected away from the ovary and pelvic sidewall with harmonic scalpel down to the level of the uterine fundus. The round ligament and broad ligament were taken down with harmonic scalpel down to the level of the bladder flap which was fully created Amita in the midline and pushed away from the underlying cervix. The uterine artery on this side was likewise cauterized and transected with the harmonic scalpel. No active bleeding was noted. The harmonic scalpel was then utilized to come across the cervical stump and completely amputate the uterus from the cervical stump.  The pelvis was then irrigated and cleared of any blood and no active bleeding noted. The 12 mm port was then replaced with the Storz morcellator under direct visualization. The uterus was then carefully morcellated under direct visualization at all times and removed and handed off to pathology. Both fallopian tubes were removed separately and handed off to pathology. A careful inspection was made of the pelvis and abdomen with no further pieces of tissue identified and all was irrigated once again. The morcellator was then removed and replaced with the 12 mm port and Interceed was then placed over the cervical stump. The pressure was taken down and no active bleeding noted. The 12 mm port was then removed and the fascia closed under  direct visualization with the camera. The 5 mm ports were then removed under direct visualization and the pneumoperitoneum reduced. The skin at all port  sites was closed with subcuticular stitch of 4-0 Vicryl and Dermabond. The patient was then awakened and taken to the recovery room in good condition.

## 2013-12-18 ENCOUNTER — Encounter (HOSPITAL_COMMUNITY): Payer: Self-pay | Admitting: Obstetrics and Gynecology

## 2013-12-18 LAB — CBC
HCT: 32.5 % — ABNORMAL LOW (ref 36.0–46.0)
Hemoglobin: 11.2 g/dL — ABNORMAL LOW (ref 12.0–15.0)
MCH: 32.2 pg (ref 26.0–34.0)
MCHC: 34.5 g/dL (ref 30.0–36.0)
MCV: 93.4 fL (ref 78.0–100.0)
Platelets: 191 10*3/uL (ref 150–400)
RBC: 3.48 MIL/uL — ABNORMAL LOW (ref 3.87–5.11)
RDW: 12.3 % (ref 11.5–15.5)
WBC: 9 10*3/uL (ref 4.0–10.5)

## 2013-12-18 MED ORDER — OXYCODONE-ACETAMINOPHEN 5-325 MG PO TABS: 1.0000 | ORAL_TABLET | ORAL | Status: DC | PRN

## 2013-12-18 MED ORDER — IBUPROFEN 600 MG PO TABS: 600.0000 mg | ORAL_TABLET | Freq: Four times a day (QID) | ORAL | Status: DC | PRN

## 2013-12-18 NOTE — Progress Notes (Signed)
1 Day Post-Op Procedure(s) (LRB): LAPAROSCOPIC  SUPRACERVICAL HYSTERECTOMY WITH BILATERAL SALPINGECTOMY (N/A)  Subjective: Patient reports tolerating PO.  She just had her foley catheter d/c   She is tolerating po medications  Objective: I have reviewed patient's vital signs, intake and output and medications.  General: alert and cooperative GI: soft NT   Incisions C/D/I  Assessment: s/p Procedure(s) with comments: LAPAROSCOPIC  SUPRACERVICAL HYSTERECTOMY WITH BILATERAL SALPINGECTOMY (N/A) - 2hrs OR time: stable and progressing well  Plan: Advance diet Encourage ambulation Discharge home this PM if voiding well and tolerating po.  LOS: 1 day    Jackie [name] W 12/18/2013, 8:51 AM

## 2013-12-18 NOTE — Discharge Summary (Signed)
Physician Discharge Summary  Patient ID: Jackie FlackKausar Winsett MRN: [id number] DOB/AGE: 11-08-1970 43 y.o.  Admit date: 12/17/2013 Discharge date: 12/18/2013  Admission Diagnoses: Menorrhagia                                         Dysmneorrhea  Discharge Diagnoses: same  Active Problems:   S/P laparoscopic supracervical hysterectomy   Discharged Condition: good  Hospital Course: Pt admitted for routine post-operative care following a LSH/bilateral salpingectomy.  Postoperative course was unremarkable, by post-operative day #1 was tolerating regular diet, ambulating and voiding well.   Significant Diagnostic Studies: labs: CBC  Treatments: LSH/bilateral salpingectomy  Discharge Exam: Blood pressure 96/58, pulse 61, temperature 98.6 F (37 C), temperature source Oral, resp. rate 18, height 5\' 2"  (1.575 m), weight 60.782 kg (134 lb), last menstrual period 11/30/2013, SpO2 95.00%. General appearance: alert and cooperative GI: soft NT, incisions clean, dry, intact  Disposition: home     Discharge Orders   Future Orders Complete By Expires   Diet - low sodium heart healthy  As directed    Discharge instructions  As directed    Comments:     Avoid driving for at least 1-2 weeks or until off narcotic pain meds.  No heavy lifting greater than 10 lbs.  Nothing in vagina for 6 weeks.    Shower over incision and pat dry.   Increase activity slowly  As directed        Medication List         cholecalciferol 400 UNIT/ML Liqd  Commonly known as:  D-VI-SOL  Take 2,000 Units by mouth daily.     ECHINACEA ACZ PO  Take 2 drops by mouth as needed (energy or for immunosuppressant).     ibuprofen 600 MG tablet  Commonly known as:  ADVIL,MOTRIN  Take 1 tablet (600 mg total) by mouth every 6 (six) hours as needed (mild pain).     loratadine 10 MG tablet  Commonly known as:  CLARITIN  Take 10 mg by mouth daily as needed.     multivitamin tablet  Take 1 tablet by mouth daily.     oxyCODONE-acetaminophen 5-325 MG per tablet  Commonly known as:  PERCOCET/ROXICET  Take 1-2 tablets by mouth every 4 (four) hours as needed for severe pain (moderate to severe pain (when tolerating fluids)).       Follow-up Information   Follow up with Oliver PilaICHARDSON,Naz Denunzio W, MD. Schedule an appointment as soon as possible for a visit in 2 weeks. (incision check)    Specialty:  Obstetrics and Gynecology   Contact information:   [address], [address] [place] [id number] [phone]       Signed: Oliver PilaRICHARDSON,Kylyn Sookram W 12/18/2013, 8:55 AM

## 2013-12-18 NOTE — Progress Notes (Signed)
Pt discharged home with husband... Condition stable... No equipment... Ambulated to car with E. Natalynn Pedone, RN. 

## 2014-04-24 ENCOUNTER — Encounter: Payer: Self-pay | Admitting: Family Medicine

## 2014-04-24 ENCOUNTER — Ambulatory Visit (INDEPENDENT_AMBULATORY_CARE_PROVIDER_SITE_OTHER): Payer: 59 | Admitting: Family Medicine

## 2014-04-24 VITALS — BP 100/80 | HR 73 | Temp 98.0°F | Ht 61.75 in | Wt 127.0 lb

## 2014-04-24 DIAGNOSIS — R5382 Chronic fatigue, unspecified: Secondary | ICD-10-CM

## 2014-04-24 DIAGNOSIS — R5381 Other malaise: Secondary | ICD-10-CM

## 2014-04-24 DIAGNOSIS — D649 Anemia, unspecified: Secondary | ICD-10-CM

## 2014-04-24 DIAGNOSIS — F411 Generalized anxiety disorder: Secondary | ICD-10-CM

## 2014-04-24 DIAGNOSIS — R109 Unspecified abdominal pain: Secondary | ICD-10-CM

## 2014-04-24 DIAGNOSIS — R5383 Other fatigue: Secondary | ICD-10-CM

## 2014-04-24 DIAGNOSIS — F419 Anxiety disorder, unspecified: Secondary | ICD-10-CM

## 2014-04-24 DIAGNOSIS — E871 Hypo-osmolality and hyponatremia: Secondary | ICD-10-CM

## 2014-04-24 MED ORDER — HYOSCYAMINE SULFATE 0.125 MG SL SUBL: 0.1250 mg | SUBLINGUAL_TABLET | SUBLINGUAL | Status: DC | PRN

## 2014-04-24 NOTE — Progress Notes (Signed)
Patient ID: Jackie FlackKausar Keil, female   DOB: [birth year], 43 y.o.   MRN: [id number] Jackie FlackKausar Braggs [id number] [birth year] 04/24/2014      Progress Note-Follow Up  Subjective  Chief Complaint  Chief Complaint  Patient presents with  . Follow-up    HPI  Patient is a 43 year old female in today for routine medical care. No recent illness, had a hysterectomy and has tolerated it well. Continues to struggle with intermittent abdominal pain but denies any change in bowel or bladder habits. Had a tearing feeling feeling in left flank but it has not been persistent. No fevers or change in appetite. Denies CP/palp/SOB/HA/congestion/fevers/GI or GU c/o. Taking meds as prescribed. Acknowledges on going stress but no suicidal ideation.  Past Medical History  Diagnosis Date  . Vitamin D deficiency 2012  . Chicken pox 43 yrs old  . Thyroid dysfunction 07/22/2011    history - resolved, no current problem  . Anemia     during pregnancy  . Fibrocystic breast 07/22/2011  . Pleurisy 08/29/2011    History - resolved  . Dysmenorrhea 10/20/2011  . Vaginitis 07/15/2012  . Endometriosis 12/22/2012  . Hyponatremia 12/22/2012  . Tinea versicolor 03/07/2013  . Preventative health care 03/07/2013  . 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 SVD (spontaneous vaginal delivery)     x 1  . Missed abortion     resolved on it's own - no surgery required.  Marland Kitchen. Headache(784.0)     otc med prn    Past Surgical History  Procedure Laterality Date  . Dental surgery  11/2013  . Laparoscopic supracervical hysterectomy N/A 12/17/2013    Procedure: LAPAROSCOPIC  SUPRACERVICAL HYSTERECTOMY WITH BILATERAL SALPINGECTOMY;  Surgeon: Oliver PilaKathy W Richardson, MD;  Location: [facility];  Service: Gynecology;  Laterality: N/A;  2hrs OR time    Family History  Problem Relation Age of Onset  . Diabetes Mother     type 2  . Stroke Mother   . Osteoporosis Mother   . Heart disease Mother     angina  . Heart attack Father     MI in  his 3750s  . Diabetes Father     type 2  . Obesity Sister   . Diabetes Sister   . Obesity Brother   . Diabetes Brother     type 2  . Aneurysm Maternal Grandmother   . Heart attack Maternal Grandfather   . Pneumonia Paternal Grandfather   . Hypertension Sister     induced  . Other Sister     anemic  . Anemia Sister   . Depression Sister     History   Social History  . Marital Status: Married    Spouse Name: N/A    Number of Children: 1  . Years of Education: N/A   Occupational History  . Not on file.   Social History Main Topics  . Smoking status: Never Smoker   . Smokeless tobacco: Never Used  . Alcohol Use: Yes     Comment: 1 to 2 glasses 5 times a week. but then the next week maybe nothing  . Drug Use: No  . Sexual Activity: Yes    Partners: Male    Birth Control/ Protection: None   Other Topics Concern  . Not on file   Social History Narrative  . No narrative on file    Current Outpatient Prescriptions on File Prior to Visit  Medication Sig Dispense Refill  . cholecalciferol (D-VI-SOL) 400 UNIT/ML LIQD Take  2,000 Units by mouth daily.      . Multiple Vitamin (MULTIVITAMIN) tablet Take 1 tablet by mouth daily.        Marland Kitchen ibuprofen (ADVIL,MOTRIN) 600 MG tablet Take 1 tablet (600 mg total) by mouth every 6 (six) hours as needed (mild pain).  30 tablet  0  . Multiple Vitamins-Minerals (ECHINACEA ACZ PO) Take 2 drops by mouth as needed (energy or for immunosuppressant).       No current facility-administered medications on file prior to visit.    Allergies  Allergen Reactions  . Nizoral [Ketoconazole]     Burning and dry skin    Review of Systems  Review of Systems  Constitutional: Negative for fever, chills and malaise/fatigue.  HENT: Negative for congestion, hearing loss and nosebleeds.   Eyes: Negative for discharge.  Respiratory: Negative for cough, sputum production, shortness of breath and wheezing.   Cardiovascular: Negative for chest pain,  palpitations and leg swelling.  Gastrointestinal: Negative for heartburn, nausea, vomiting, abdominal pain, diarrhea, constipation and blood in stool.  Genitourinary: Negative for dysuria, urgency, frequency and hematuria.  Musculoskeletal: Negative for back pain, falls and myalgias.  Skin: Negative for rash.  Neurological: Negative for dizziness, tremors, sensory change, focal weakness, loss of consciousness, weakness and headaches.  Endo/Heme/Allergies: Negative for polydipsia. Does not bruise/bleed easily.  Psychiatric/Behavioral: Negative for depression and suicidal ideas. The patient is not nervous/anxious and does not have insomnia.     Objective  BP 100/80  Pulse 73  Temp(Src) 98 F (36.7 C) (Oral)  Ht 5' 1.75" (1.568 m)  Wt 127 lb (57.607 kg)  BMI 23.43 kg/m2  SpO2 99%  LMP 12/03/2013  Physical Exam  Physical Exam  Constitutional: She is oriented to person, place, and time and well-developed, well-nourished, and in no distress. No distress.  HENT:  Head: Normocephalic and atraumatic.  Eyes: Conjunctivae are normal.  Neck: Neck supple. No thyromegaly present.  Cardiovascular: Normal rate, regular rhythm and normal heart sounds.   No murmur heard. Pulmonary/Chest: Effort normal and breath sounds normal. She has no wheezes.  Abdominal: She exhibits no distension and no mass.  Musculoskeletal: She exhibits no edema.  Lymphadenopathy:    She has no cervical adenopathy.  Neurological: She is alert and oriented to person, place, and time.  Skin: Skin is warm and dry. No rash noted. She is not diaphoretic.  Psychiatric: Memory, affect and judgment normal.    Lab Results  Component Value Date   TSH 2.910 08/20/2013   Lab Results  Component Value Date   WBC 9.0 12/18/2013   HGB 11.2* 12/18/2013   HCT 32.5* 12/18/2013   MCV 93.4 12/18/2013   PLT 191 12/18/2013   Lab Results  Component Value Date   CREATININE 0.65 02/28/2013   BUN 11 02/28/2013   NA 136 02/28/2013   K 4.2  02/28/2013   CL 102 02/28/2013   CO2 25 02/28/2013   Lab Results  Component Value Date   ALT 12 02/28/2013   AST 19 02/28/2013   ALKPHOS 50 02/28/2013   BILITOT 0.5 02/28/2013   Lab Results  Component Value Date   CHOL 197 02/28/2013   Lab Results  Component Value Date   HDL 86 02/28/2013   Lab Results  Component Value Date   LDLCALC 87 02/28/2013   Lab Results  Component Value Date   TRIG 122 02/28/2013   Lab Results  Component Value Date   CHOLHDL 2.3 02/28/2013     Assessment & Plan  Anemia, unspecified Increase leafy greens, consider increased lean red meat and using cast iron cookware. Continue to monitor, report any concerns  Abdominal pain, unspecified site Mild and improved s/p hysterectomy  Anxiety disorder Continues to struggle with numerous stressors and a difficult marriage but declines medications, feels she is managing adequately  Hyponatremia Will repeat labs prior to next visit  Fatigue Encouraged adequate sleep, exercise and heart healthy diet

## 2014-04-24 NOTE — Patient Instructions (Addendum)
Free T4 with next visit  Try 2 Tums, if helps some then add Zantac/Ranitidine 150 mg at bedtime If scratchy throat or congestion persist add a Cetirizine/Zyrtec 10 mg po daily in am  Call if worse  Consider Health Coach Certification or Medical Biller/Coder Certification  Add Benefiber powder, twice daily to liquids or food and then follow with a glass of water.   Ginger works well for nausea  Try biotin, zinc supplements, 50 mg daily, fatty acids (krill, fish or flaxseed) Adhesions Adhesions are stringy (fibrous) bands of tissue. Adhesions are similar to scars, but they are on the inside of your body. Adhesions form between two surfaces of the body. CAUSES   The most common adhesions are those that occur following surgery. Touching and moving things within the belly (abdomen) leads to inflammation and adhesions can form. Not all people get adhesions following surgery. But, adhesions may happen even with the gentlest handling of the organs in the abdomen. There is no way to predict who will have adhesions. The adhesions can occur between:  Loops of bowel.  The bowel and other organs such as the liver.  The contents in the pelvis such as the uterus, bladder, ovaries and tubes.  Inflammation within the abdomen even if there is no surgery. An example would be an infection in the tubes and uterus (pelvic inflammatory disease). Any infection will cause inflammation that may lead to adhesions.  Radiation treatment. SYMPTOMS  Many people have no signs or symptoms from adhesions. However, adhesions may cause a number of symptoms. Some of these are:  Abdominal pain, tenderness or cramping.  Abdominal bloating.  Constipation or diarrhea.  Vomiting.  Painful sex.  In females, difficulty getting pregnant. DIAGNOSIS   An exam by your caregiver may suggest that adhesions are present.  A surgical procedure using a small incision and a thin scope can be used to look inside the abdomen  at the adhesions.  Sometimes x-rays may suggest adhesions inside the abdomen. TREATMENT  Surgery can be performed to separate the adhesions. This often takes care of the problems caused by the adhesions, although surgery may also cause more adhesions to develop. PROGNOSIS   The outcome is usually good if an operation is used to fix adhesions inside the belly.  All adhesions may reoccur and the problem can come back. HOME CARE INSTRUCTIONS   Take usual medications as directed by your caregiver.  Only take over-the-counter or prescription medicines for pain, discomfort or fever as directed by your caregiver.  Pay attention to the pain:  Has it changed?  Has it moved?  Is it gone?  Do not eat solid food until your pain is gone.  While you have pain: Stay on a clear liquid diet. A clear liquid is one you can see through (water, weak tea, broth or bouillon, lemon lime carbonated drinks, gelatin, popsicles or ice chips).  When your pain is gone: Start a light diet (dry toast, crackers, applesauce, white rice, bananas, broth or bouillon). Increase the diet slowly as long as it does not bother you. No dairy products (including cheese and eggs) and no spicy, fatty, fried or high fiber foods. Your caregiver will tell you if you should be on a special diet.  No alcohol, caffeine or cigarettes.  If your caregiver has given you a follow-up appointment, it is very important to keep that appointment. Not keeping the appointment could result in a permanent injury or lasting (chronic) pain or disability. SEEK IMMEDIATE MEDICAL CARE  IF:   Your pain is not gone in 24 hours.  Your pain becomes worse, changes location or feels different.  You have a fever.  Your vomiting will not stop.  You have blood or brown flecks (like coffee grounds) in your vomit.  You have blood in your bowel movements.  Your bowel movements are dark or black.  Your bowel movements stop (become blocked) or you can  not pass gas. Document Released: 11/26/2003 Document Revised: 11/28/2011 Document Reviewed: 06/26/2009 [facility] Patient Information 2015 [employer] [place]. This information is not intended to replace advice given to you by your health care provider. Make sure you discuss any questions you have with your health care provider.

## 2014-04-24 NOTE — Progress Notes (Signed)
Pre visit review using our clinic review tool, if applicable. No additional management support is needed unless otherwise documented below in the visit note. 

## 2014-04-27 ENCOUNTER — Encounter: Payer: Self-pay | Admitting: Family Medicine

## 2014-04-27 DIAGNOSIS — R109 Unspecified abdominal pain: Secondary | ICD-10-CM | POA: Insufficient documentation

## 2014-04-27 DIAGNOSIS — D649 Anemia, unspecified: Secondary | ICD-10-CM

## 2014-04-27 HISTORY — DX: Anemia, unspecified: D64.9

## 2014-04-27 NOTE — Assessment & Plan Note (Signed)
Mild and improved s/p hysterectomy

## 2014-04-27 NOTE — Assessment & Plan Note (Signed)
Encouraged adequate sleep, exercise and heart healthy diet

## 2014-04-27 NOTE — Assessment & Plan Note (Signed)
Increase leafy greens, consider increased lean red meat and using cast iron cookware. Continue to monitor, report any concerns 

## 2014-04-27 NOTE — Assessment & Plan Note (Signed)
Continues to struggle with numerous stressors and a difficult marriage but declines medications, feels she is managing adequately

## 2014-04-27 NOTE — Assessment & Plan Note (Signed)
Will repeat labs prior to next visit

## 2014-04-29 ENCOUNTER — Telehealth: Payer: Self-pay | Admitting: *Deleted

## 2014-04-29 DIAGNOSIS — D649 Anemia, unspecified: Secondary | ICD-10-CM

## 2014-04-29 DIAGNOSIS — Z0000 Encounter for general adult medical examination without abnormal findings: Secondary | ICD-10-CM

## 2014-04-29 NOTE — Telephone Encounter (Signed)
Was pt informed of this?

## 2014-04-29 NOTE — Telephone Encounter (Signed)
Pt came to office to today to complete labs that she thought were to be done at her most recent visit on 04/24/14. Advised front office that I see no orders for immediate labs and that she should return for labs prior to next appt in 06/2014. See below order:  Return in about 10 weeks (around 07/03/2014) for annual exam, lipid, renal, cbc, tsh, hepatic prior.

## 2014-04-30 NOTE — Telephone Encounter (Signed)
Pt stated that "she was here last week and was told to have bloodwork done prior to annual. She wanted to double check and make sure.[place] Poli/LDM

## 2014-04-30 NOTE — Telephone Encounter (Signed)
Front office staff was supposed to tell pt this yesterday. I routed it to you in case you wanted to enter her future labs.

## 2014-05-01 NOTE — Telephone Encounter (Signed)
Please call and inform patient that she does need to do labs before her next visit.  I ordered labs

## 2014-05-08 ENCOUNTER — Telehealth: Payer: Self-pay

## 2014-05-08 NOTE — Telephone Encounter (Signed)
Left message on voice mail. LDM

## 2014-05-12 NOTE — Telephone Encounter (Signed)
Please inform pt

## 2014-05-12 NOTE — Telephone Encounter (Signed)
Informed patient of this.  °

## 2014-05-23 ENCOUNTER — Telehealth: Payer: Self-pay

## 2014-05-23 ENCOUNTER — Other Ambulatory Visit (INDEPENDENT_AMBULATORY_CARE_PROVIDER_SITE_OTHER): Payer: 59

## 2014-05-23 DIAGNOSIS — Z0000 Encounter for general adult medical examination without abnormal findings: Secondary | ICD-10-CM

## 2014-05-23 LAB — CBC WITH DIFFERENTIAL/PLATELET
Basophils Absolute: 0 10*3/uL (ref 0.0–0.1)
Basophils Relative: 0.5 % (ref 0.0–3.0)
Eosinophils Absolute: 0 10*3/uL (ref 0.0–0.7)
Eosinophils Relative: 0.5 % (ref 0.0–5.0)
HCT: 37.2 % (ref 36.0–46.0)
Hemoglobin: 12.8 g/dL (ref 12.0–15.0)
Lymphocytes Relative: 27.3 % (ref 12.0–46.0)
Lymphs Abs: 1.8 10*3/uL (ref 0.7–4.0)
MCHC: 34.3 g/dL (ref 30.0–36.0)
MCV: 92.6 fl (ref 78.0–100.0)
Monocytes Absolute: 0.4 10*3/uL (ref 0.1–1.0)
Monocytes Relative: 6.1 % (ref 3.0–12.0)
Neutro Abs: 4.3 10*3/uL (ref 1.4–7.7)
Neutrophils Relative %: 65.6 % (ref 43.0–77.0)
Platelets: 212 10*3/uL (ref 150.0–400.0)
RBC: 4.02 Mil/uL (ref 3.87–5.11)
RDW: 12.4 % (ref 11.5–15.5)
WBC: 6.5 10*3/uL (ref 4.0–10.5)

## 2014-05-23 LAB — RENAL FUNCTION PANEL
Albumin: 3.6 g/dL (ref 3.5–5.2)
BUN: 15 mg/dL (ref 6–23)
CO2: 24 mEq/L (ref 19–32)
Calcium: 9.1 mg/dL (ref 8.4–10.5)
Chloride: 106 mEq/L (ref 96–112)
Creatinine, Ser: 0.5 mg/dL (ref 0.4–1.2)
GFR: 133.11 mL/min (ref >60.00)
Glucose, Bld: 86 mg/dL (ref 70–99)
Phosphorus: 3.9 mg/dL (ref 2.3–4.6)
Potassium: 3.6 mEq/L (ref 3.5–5.1)
Sodium: 137 mEq/L (ref 135–145)

## 2014-05-23 LAB — LIPID PANEL
Cholesterol: 186 mg/dL (ref 0–200)
HDL: 69.2 mg/dL (ref >39.00)
LDL Cholesterol: 87 mg/dL (ref 0–99)
NonHDL: 116.8
Total CHOL/HDL Ratio: 3
Triglycerides: 148 mg/dL (ref 0.0–149.0)
VLDL: 29.6 mg/dL (ref 0.0–40.0)

## 2014-05-23 LAB — HEPATIC FUNCTION PANEL
ALT: 17 U/L (ref 0–35)
AST: 23 U/L (ref 0–37)
Albumin: 3.6 g/dL (ref 3.5–5.2)
Alkaline Phosphatase: 48 U/L (ref 39–117)
Bilirubin, Direct: 0.1 mg/dL (ref 0.0–0.3)
Total Bilirubin: 0.5 mg/dL (ref 0.2–1.2)
Total Protein: 6.8 g/dL (ref 6.0–8.3)

## 2014-05-23 LAB — TSH: TSH: 2.65 u[IU]/mL (ref 0.35–4.50)

## 2014-05-23 NOTE — Telephone Encounter (Signed)
Labs were ordered for [facility]. Need to be changed to [facility]

## 2014-06-10 ENCOUNTER — Telehealth: Payer: Self-pay

## 2014-06-10 ENCOUNTER — Ambulatory Visit: Payer: Self-pay | Admitting: Family Medicine

## 2014-06-10 NOTE — Telephone Encounter (Signed)
Appointment with Dr. Laury Axon today at 6:15 pm noted.

## 2014-06-10 NOTE — Telephone Encounter (Signed)
Patient Information:  Caller Name: Aaren  Phone: [phone]  Patient: Jackie [name], Jackie [name]  Gender: Female  DOB: 1971-08-19  Age: 43 Years  PCP: [facility])  Pregnant: No  Office Follow Up:  Does the office need to follow up with this patient?: No  Instructions For The Office: N/A  RN Note:  Patient is status post Laprascopic partial Hysterectomy of 12/17/13. Patient states she developed severe pain and burning with urination, onset 06/09/14. Patient states she also is experiencing a "dull, heavy feeling" in her left lower abdomen and left flank. Patient states she noted hematuria 06/10/14. Denies hematuria 06/09/14 but states small clots noted in urine 06/10/14. Patient states she started OTC AZO 06/09/14 with some relief of dysuria. Patient had Ibuprofen . at approx. 1100 06/10/14 with some relief. Patient states pain is not severe at present. States pain is currently at a "6-7" on a 1-10 scale. Patient states urine is cloudy. Afebrile.  Care advice given given per guidelines. Call back parameters reviewed. Patient verbalizes understanding. No appts. available within 4 hours. RN spoke with Galen Daft, office nurse. Order received to schedule appt. for 06/10/14 1815 with Dr, Laury Axon. Patient informed of above. Call back parameters reviewed. Patient verbalizes understanding.   Symptoms  Reason For Call & Symptoms: Urinary sx  Reviewed Health History In EMR: Yes  Reviewed Medications In EMR: Yes  Reviewed Allergies In EMR: Yes  Reviewed Surgeries / Procedures: Yes  Date of Onset of Symptoms: 06/09/2014  Treatments Tried: Azo  Treatments Tried Worked: No OB / GYN:  LMP: Unknown  Guideline(s) Used:  Urination Pain - Female  Disposition Per Guideline:   Go to Office Now  Reason For Disposition Reached:   Side (flank) or lower back pain present  Advice Given:  Fluids:   Drink extra fluids. Drink 8-10 glasses of liquids a day (Reason: to produce a dilute,  non-irritating urine).  Call Back If:   You become worse.  Patient Will Follow Care Advice:  YES  Appointment Scheduled:  06/10/2014 18:15:00 Appointment Scheduled Provider:  Lelon Perla.

## 2014-07-03 ENCOUNTER — Ambulatory Visit (INDEPENDENT_AMBULATORY_CARE_PROVIDER_SITE_OTHER): Payer: 59 | Admitting: Family Medicine

## 2014-07-03 ENCOUNTER — Other Ambulatory Visit (HOSPITAL_COMMUNITY)
Admission: RE | Admit: 2014-07-03 | Discharge: 2014-07-03 | Disposition: A | Discharge status: Home / Self Care | Payer: 59 | Source: Ambulatory Visit | Attending: Family Medicine | Admitting: Family Medicine

## 2014-07-03 ENCOUNTER — Encounter: Payer: Self-pay | Admitting: Family Medicine

## 2014-07-03 VITALS — BP 109/59 | HR 66 | Temp 98.2°F | Ht 61.75 in | Wt 129.4 lb

## 2014-07-03 DIAGNOSIS — D649 Anemia, unspecified: Secondary | ICD-10-CM

## 2014-07-03 DIAGNOSIS — N760 Acute vaginitis: Secondary | ICD-10-CM | POA: Diagnosis present

## 2014-07-03 DIAGNOSIS — Z113 Encounter for screening for infections with a predominantly sexual mode of transmission: Secondary | ICD-10-CM | POA: Insufficient documentation

## 2014-07-03 DIAGNOSIS — N390 Urinary tract infection, site not specified: Secondary | ICD-10-CM

## 2014-07-03 DIAGNOSIS — M542 Cervicalgia: Secondary | ICD-10-CM

## 2014-07-03 DIAGNOSIS — L659 Nonscarring hair loss, unspecified: Secondary | ICD-10-CM

## 2014-07-03 HISTORY — DX: Nonscarring hair loss, unspecified: L65.9

## 2014-07-03 LAB — CBC
HCT: 39.8 % (ref 36.0–46.0)
Hemoglobin: 13.2 g/dL (ref 12.0–15.0)
MCHC: 33.2 g/dL (ref 30.0–36.0)
MCV: 93.3 fl (ref 78.0–100.0)
Platelets: 242 10*3/uL (ref 150.0–400.0)
RBC: 4.27 Mil/uL (ref 3.87–5.11)
RDW: 12.6 % (ref 11.5–15.5)
WBC: 5 10*3/uL (ref 4.0–10.5)

## 2014-07-03 LAB — URINALYSIS, ROUTINE W REFLEX MICROSCOPIC
Bilirubin Urine: NEGATIVE
Ketones, ur: NEGATIVE
Nitrite: NEGATIVE
Specific Gravity, Urine: 1.02 (ref 1.000–1.030)
Total Protein, Urine: NEGATIVE
Urine Glucose: NEGATIVE
Urobilinogen, UA: 0.2 (ref 0.0–1.0)
pH: 7 (ref 5.0–8.0)

## 2014-07-03 LAB — RENAL FUNCTION PANEL
Albumin: 3.5 g/dL (ref 3.5–5.2)
BUN: 14 mg/dL (ref 6–23)
CO2: 26 mEq/L (ref 19–32)
Calcium: 9.1 mg/dL (ref 8.4–10.5)
Chloride: 103 mEq/L (ref 96–112)
Creatinine, Ser: 0.6 mg/dL (ref 0.4–1.2)
GFR: 115.3 mL/min (ref >60.00)
Glucose, Bld: 85 mg/dL (ref 70–99)
Phosphorus: 3.4 mg/dL (ref 2.3–4.6)
Potassium: 4.1 mEq/L (ref 3.5–5.1)
Sodium: 136 mEq/L (ref 135–145)

## 2014-07-03 LAB — MAGNESIUM: Magnesium: 1.9 mg/dL (ref 1.5–2.5)

## 2014-07-03 NOTE — Assessment & Plan Note (Signed)
Resolved with recent labs. With recent symptoms will repeat CBC

## 2014-07-03 NOTE — Patient Instructions (Signed)
Hydrate well, 64 oz of clear fluids Call to bring in a urine sample if symptoms return, increase   Urinary Tract Infection Urinary tract infections (UTIs) can develop anywhere along your urinary tract. Your urinary tract is your body's drainage system for removing wastes and extra water. Your urinary tract includes two kidneys, two ureters, a bladder, and a urethra. Your kidneys are a pair of bean-shaped organs. Each kidney is about the size of your fist. They are located below your ribs, one on each side of your spine. CAUSES Infections are caused by microbes, which are microscopic organisms, including fungi, viruses, and bacteria. These organisms are so small that they can only be seen through a microscope. Bacteria are the microbes that most commonly cause UTIs. SYMPTOMS  Symptoms of UTIs may vary by age and gender of the patient and by the location of the infection. Symptoms in young women typically include a frequent and intense urge to urinate and a painful, burning feeling in the bladder or urethra during urination. Older women and men are more likely to be tired, shaky, and weak and have muscle aches and abdominal pain. A fever may mean the infection is in your kidneys. Other symptoms of a kidney infection include pain in your back or sides below the ribs, nausea, and vomiting. DIAGNOSIS To diagnose a UTI, your caregiver will ask you about your symptoms. Your caregiver also will ask to provide a urine sample. The urine sample will be tested for bacteria and white blood cells. White blood cells are made by your body to help fight infection. TREATMENT  Typically, UTIs can be treated with medication. Because most UTIs are caused by a bacterial infection, they usually can be treated with the use of antibiotics. The choice of antibiotic and length of treatment depend on your symptoms and the type of bacteria causing your infection. HOME CARE INSTRUCTIONS  If you were prescribed antibiotics, take  them exactly as your caregiver instructs you. Finish the medication even if you feel better after you have only taken some of the medication.  Drink enough water and fluids to keep your urine clear or pale yellow.  Avoid caffeine, tea, and carbonated beverages. They tend to irritate your bladder.  Empty your bladder often. Avoid holding urine for long periods of time.  Empty your bladder before and after sexual intercourse.  After a bowel movement, women should cleanse from front to back. Use each tissue only once. SEEK MEDICAL CARE IF:   You have back pain.  You develop a fever.  Your symptoms do not begin to resolve within 3 days. SEEK IMMEDIATE MEDICAL CARE IF:   You have severe back pain or lower abdominal pain.  You develop chills.  You have nausea or vomiting.  You have continued burning or discomfort with urination. MAKE SURE YOU:   Understand these instructions.  Will watch your condition.  Will get help right away if you are not doing well or get worse. Document Released: 06/15/2005 Document Revised: 03/06/2012 Document Reviewed: 10/14/2011 [facility] Patient Information 2015 [employer] [place] [employer], [place]. This information is not intended to replace advice given to you by your health care provider. Make sure you discuss any questions you have with your health care provider.

## 2014-07-03 NOTE — Assessment & Plan Note (Signed)
Was seen by OB/GYN for some urinary vs vaginal bleeding? They performed an internal exam and some trace of infection was found in urine and cultures did show UTI so they treated with 7 days of antibiotics

## 2014-07-03 NOTE — Assessment & Plan Note (Addendum)
Recently treated but still having some left lower quadrant discomfort intermittently, urine culture negative. Increase hydration and continue probiotics

## 2014-07-03 NOTE — Progress Notes (Signed)
Pre visit review using our clinic review tool, if applicable. No additional management support is needed unless otherwise documented below in the visit note. 

## 2014-07-04 LAB — URINE CYTOLOGY ANCILLARY ONLY
Chlamydia: NEGATIVE
Neisseria Gonorrhea: NEGATIVE
Trichomonas: NEGATIVE

## 2014-07-04 LAB — URINE CULTURE
Colony Count: NO GROWTH
Organism ID, Bacteria: NO GROWTH

## 2014-07-05 LAB — ZINC: Zinc: 101 ug/dL (ref 60–130)

## 2014-07-06 ENCOUNTER — Encounter: Payer: Self-pay | Admitting: Family Medicine

## 2014-07-06 NOTE — Assessment & Plan Note (Signed)
Encouraged moist heat and gentle stretching as tolerated. May try NSAIDs and prescription meds as directed and report if symptoms worsen or seek immediate care 

## 2014-07-06 NOTE — Progress Notes (Signed)
Patient ID: Jackie [name], female   DOB: [birth year], 43 y.o.   MRN: [id number] Jackie [name] [id number] [birth year] 07/06/2014      Progress Note-Follow Up  Subjective  Chief Complaint  Chief Complaint  Patient presents with  . Follow-up    10 week    HPI  Patient is a 43 year old female in today for routine medical care. In today for followup. He is in is also followed with OB/GYN does note some recent increase in vaginal symptoms and no fevers or chills and has not recently been sexually active. Anxiety around a difficult marriage but it is tolerating it well Is doing better than usual. No acute illness. Does note some neck/shoulder/back pain. No trauma or radicular complaints. Denies CP/palp/SOB/HA/congestion/fevers/GI or GU c/o. Taking meds as prescribed  Past Medical History  Diagnosis Date  . Vitamin D deficiency 2012  . Chicken pox 43 yrs old  . Thyroid dysfunction 07/22/2011    history - resolved, no current problem  . Anemia     during pregnancy  . Fibrocystic breast 07/22/2011  . Pleurisy 08/29/2011    History - resolved  . Dysmenorrhea 10/20/2011  . Vaginitis 07/15/2012  . Endometriosis 12/22/2012  . Hyponatremia 12/22/2012  . Tinea versicolor 03/07/2013  . Preventative health care 03/07/2013  . 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 SVD (spontaneous vaginal delivery)     x 1  . Missed abortion     resolved on it's own - no surgery required.  Marland Kitchen Headache(784.0)     otc med prn  . Anemia, unspecified 04/27/2014  . Hair loss 07/03/2014    Past Surgical History  Procedure Laterality Date  . Dental surgery  11/2013  . Laparoscopic supracervical hysterectomy N/A 12/17/2013    Procedure: LAPAROSCOPIC  SUPRACERVICAL HYSTERECTOMY WITH BILATERAL SALPINGECTOMY;  Surgeon: Oliver Pila, MD;  Location: [facility];  Service: Gynecology;  Laterality: N/A;  2hrs OR time    Family History  Problem Relation Age of Onset  . Diabetes Mother     type 2  . Stroke  Mother   . Osteoporosis Mother   . Heart disease Mother     angina  . Heart attack Father     MI in his 92s  . Diabetes Father     type 2  . Obesity Sister   . Diabetes Sister   . Obesity Brother   . Diabetes Brother     type 2  . Aneurysm Maternal Grandmother   . Heart attack Maternal Grandfather   . Pneumonia Paternal Grandfather   . Hypertension Sister     induced  . Other Sister     anemic  . Anemia Sister   . Depression Sister     History   Social History  . Marital Status: Married    Spouse Name: N/A    Number of Children: 1  . Years of Education: N/A   Occupational History  . Not on file.   Social History Main Topics  . Smoking status: Never Smoker   . Smokeless tobacco: Never Used  . Alcohol Use: Yes     Comment: 1 to 2 glasses 5 times a week. but then the next week maybe nothing  . Drug Use: No  . Sexual Activity: Yes    Partners: Male    Birth Control/ Protection: None   Other Topics Concern  . Not on file   Social History Narrative  . No narrative on file  Current Outpatient Prescriptions on File Prior to Visit  Medication Sig Dispense Refill  . Multiple Vitamin (MULTIVITAMIN) tablet Take 1 tablet by mouth daily.         No current facility-administered medications on file prior to visit.    Allergies  Allergen Reactions  . Nizoral [Ketoconazole]     Burning and dry skin    Review of Systems  Review of Systems  Constitutional: Positive for malaise/fatigue. Negative for fever.  HENT: Negative for congestion.   Eyes: Negative for discharge.  Respiratory: Negative for shortness of breath.   Cardiovascular: Negative for chest pain, palpitations and leg swelling.  Gastrointestinal: Negative for nausea, abdominal pain and diarrhea.  Genitourinary: Positive for dysuria, urgency, frequency and hematuria. Negative for flank pain.  Musculoskeletal: Positive for myalgias and neck pain. Negative for falls.  Skin: Negative for rash.   Neurological: Negative for loss of consciousness and headaches.  Endo/Heme/Allergies: Negative for polydipsia.  Psychiatric/Behavioral: Negative for depression and suicidal ideas. The patient is nervous/anxious. The patient does not have insomnia.     Objective  BP 109/59  Pulse 66  Temp(Src) 98.2 F (36.8 C) (Oral)  Ht 5' 1.75" (1.568 m)  Wt 129 lb 6.4 oz (58.695 kg)  BMI 23.87 kg/m2  SpO2 100%  LMP 12/03/2013  Physical Exam  Physical Exam  Constitutional: She is oriented to person, place, and time and well-developed, well-nourished, and in no distress. No distress.  HENT:  Head: Normocephalic and atraumatic.  Eyes: Conjunctivae are normal.  Neck: Neck supple. No thyromegaly present.  Cardiovascular: Normal rate, regular rhythm and normal heart sounds.   No murmur heard. Pulmonary/Chest: Effort normal and breath sounds normal. She has no wheezes.  Abdominal: She exhibits no distension and no mass.  Musculoskeletal: She exhibits no edema.  Lymphadenopathy:    She has no cervical adenopathy.  Neurological: She is alert and oriented to person, place, and time.  Skin: Skin is warm and dry. No rash noted. She is not diaphoretic.  Psychiatric: Memory, affect and judgment normal.    Lab Results  Component Value Date   TSH 2.65 05/23/2014   Lab Results  Component Value Date   WBC 5.0 07/03/2014   HGB 13.2 07/03/2014   HCT 39.8 07/03/2014   MCV 93.3 07/03/2014   PLT 242.0 07/03/2014   Lab Results  Component Value Date   CREATININE 0.6 07/03/2014   BUN 14 07/03/2014   NA 136 07/03/2014   K 4.1 07/03/2014   CL 103 07/03/2014   CO2 26 07/03/2014   Lab Results  Component Value Date   ALT 17 05/23/2014   AST 23 05/23/2014   ALKPHOS 48 05/23/2014   BILITOT 0.5 05/23/2014   Lab Results  Component Value Date   CHOL 186 05/23/2014   Lab Results  Component Value Date   HDL 69.20 05/23/2014   Lab Results  Component Value Date   LDLCALC 87 05/23/2014   Lab Results   Component Value Date   TRIG 148.0 05/23/2014   Lab Results  Component Value Date   CHOLHDL 3 05/23/2014     Assessment & Plan  Vaginitis Was seen by OB/GYN for some urinary vs vaginal bleeding? They performed an internal exam and some trace of infection was found in urine and cultures did show UTI so they treated with 7 days of antibiotics  UTI (lower urinary tract infection) Recently treated but still having some left lower quadrant discomfort intermittently, urine culture negative. Increase hydration and continue probiotics  Anemia Resolved with recent labs. With recent symptoms will repeat CBC  Neck pain, musculoskeletal Encouraged moist heat and gentle stretching as tolerated. May try NSAIDs and prescription meds as directed and report if symptoms worsen or seek immediate care

## 2014-07-07 LAB — URINE CYTOLOGY ANCILLARY ONLY
Bacterial vaginitis: NEGATIVE
Candida vaginitis: NEGATIVE

## 2014-08-04 ENCOUNTER — Ambulatory Visit (INDEPENDENT_AMBULATORY_CARE_PROVIDER_SITE_OTHER): Payer: 59 | Admitting: Physician Assistant

## 2014-08-04 ENCOUNTER — Encounter: Payer: Self-pay | Admitting: Physician Assistant

## 2014-08-04 VITALS — BP 112/74 | HR 62 | Temp 98.2°F | Resp 14 | Ht 61.75 in | Wt 133.1 lb

## 2014-08-04 DIAGNOSIS — J019 Acute sinusitis, unspecified: Secondary | ICD-10-CM

## 2014-08-04 DIAGNOSIS — B9689 Other specified bacterial agents as the cause of diseases classified elsewhere: Secondary | ICD-10-CM

## 2014-08-04 MED ORDER — FLUTICASONE PROPIONATE 50 MCG/ACT NA SUSP: 2.0000 | Freq: Every day | NASAL | Status: DC

## 2014-08-04 MED ORDER — AMOXICILLIN-POT CLAVULANATE 875-125 MG PO TABS: 1.0000 | ORAL_TABLET | Freq: Two times a day (BID) | ORAL | Status: DC

## 2014-08-04 NOTE — Progress Notes (Signed)
Patient presents to clinic today c/o head congestion, sinus pressure, sinus pain over the past 1.5 weeks that began as a mild head cold and has gradually worsened.  Endorses tender glands in the neck, ear pain, and sore throat.  Patient endorses significant ear pressure, but denies dizziness or tinnitus.  Denies recent travel or sick contact. Is unsure of fever.  Endorses intermittent chills.  Past Medical History  Diagnosis Date  . Vitamin D deficiency 2012  . Chicken pox 43 yrs old  . Thyroid dysfunction 07/22/2011    history - resolved, no current problem  . Anemia     during pregnancy  . Fibrocystic breast 07/22/2011  . Pleurisy 08/29/2011    History - resolved  . Dysmenorrhea 10/20/2011  . Vaginitis 07/15/2012  . Endometriosis 12/22/2012  . Hyponatremia 12/22/2012  . Tinea versicolor 03/07/2013  . Preventative health care 03/07/2013  . 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 SVD (spontaneous vaginal delivery)     x 1  . Missed abortion     resolved on it's own - no surgery required.  Marland Kitchen Headache(784.0)     otc med prn  . Anemia, unspecified 04/27/2014  . Hair loss 07/03/2014    Current Outpatient Prescriptions on File Prior to Visit  Medication Sig Dispense Refill  . Flaxseed, Linseed, (FLAX SEED OIL PO) Take by mouth.    . lactobacillus acidophilus (BACID) TABS tablet Take 1 tablet by mouth daily.    . Multiple Vitamin (MULTIVITAMIN) tablet Take 1 tablet by mouth daily.       No current facility-administered medications on file prior to visit.    Allergies  Allergen Reactions  . Nizoral [Ketoconazole]     Burning and dry skin    Family History  Problem Relation Age of Onset  . Diabetes Mother     type 2  . Stroke Mother   . Osteoporosis Mother   . Heart disease Mother     angina  . Heart attack Father     MI in his 77s  . Diabetes Father     type 2  . Obesity Sister   . Diabetes Sister   . Obesity Brother   . Diabetes Brother     type 2  .  Aneurysm Maternal Grandmother   . Heart attack Maternal Grandfather   . Pneumonia Paternal Grandfather   . Hypertension Sister     induced  . Other Sister     anemic  . Anemia Sister   . Depression Sister     History   Social History  . Marital Status: Married    Spouse Name: N/A    Number of Children: 1  . Years of Education: N/A   Social History Main Topics  . Smoking status: Never Smoker   . Smokeless tobacco: Never Used  . Alcohol Use: Yes     Comment: 1 to 2 glasses 5 times a week. but then the next week maybe nothing  . Drug Use: No  . Sexual Activity:    Partners: Male    Birth Control/ Protection: None   Other Topics Concern  . None   Social History Narrative   Review of Systems - See HPI.  All other ROS are negative.  BP 112/74 mmHg  Pulse 62  Temp(Src) 98.2 F (36.8 C) (Oral)  Resp 14  Ht 5' 1.75" (1.568 m)  Wt 133 lb 2 oz (60.385 kg)  BMI 24.56 kg/m2  SpO2 100%  LMP  12/03/2013  Physical Exam  Constitutional: She is oriented to person, place, and time and well-developed, well-nourished, and in no distress.  HENT:  Head: Normocephalic and atraumatic.  Right Ear: External ear normal.  Left Ear: External ear normal.  Nose: Nose normal.  Mouth/Throat: Oropharynx is clear and moist. No oropharyngeal exudate.  + TTP of sinuses.  Eyes: Conjunctivae are normal.  Neck: Neck supple.  Cardiovascular: Normal rate, regular rhythm, normal heart sounds and intact distal pulses.   Pulmonary/Chest: Effort normal and breath sounds normal. No respiratory distress. She has no wheezes. She has no rales. She exhibits no tenderness.  Neurological: She is alert and oriented to person, place, and time.  Skin: Skin is warm and dry. No rash noted.  Psychiatric: Affect normal.  Vitals reviewed.  Recent Results (from the past 2160 hour(s))  Lipid panel     Status: None   Collection Time: 05/23/14 12:57 PM  Result Value Ref Range   Cholesterol 186 0 - 200 mg/dL     Comment: ATP III Classification       Desirable:  < 200 mg/dL               Borderline High:  200 - 239 mg/dL          High:  > = 409240 mg/dL   Triglycerides 811.9148.0 0.0 - 149.0 mg/dL    Comment: Normal:  <147<150 mg/dLBorderline High:  150 - 199 mg/dL   HDL 82.9569.20 >62.13>39.00 mg/dL   VLDL 08.629.6 0.0 - 57.840.0 mg/dL   LDL Cholesterol 87 0 - 99 mg/dL   Total CHOL/HDL Ratio 3     Comment:                Men          Women1/2 Average Risk     3.4          3.3Average Risk          5.0          4.42X Average Risk          9.6          7.13X Average Risk          15.0          11.0                       NonHDL 116.80     Comment: NOTE:  Non-HDL goal should be 30 mg/dL higher than patient's LDL goal (i.e. LDL goal of < 70 mg/dL, would have non-HDL goal of < 100 mg/dL)  Hepatic function panel     Status: None   Collection Time: 05/23/14 12:57 PM  Result Value Ref Range   Total Bilirubin 0.5 0.2 - 1.2 mg/dL   Bilirubin, Direct 0.1 0.0 - 0.3 mg/dL   Alkaline Phosphatase 48 39 - 117 U/L   AST 23 0 - 37 U/L   ALT 17 0 - 35 U/L   Total Protein 6.8 6.0 - 8.3 g/dL   Albumin 3.6 3.5 - 5.2 g/dL  CBC w/Diff     Status: None   Collection Time: 05/23/14 12:57 PM  Result Value Ref Range   WBC 6.5 4.0 - 10.5 K/uL   RBC 4.02 3.87 - 5.11 Mil/uL   Hemoglobin 12.8 12.0 - 15.0 g/dL   HCT 46.937.2 62.936.0 - 52.846.0 %   MCV 92.6 78.0 - 100.0 fl   MCHC 34.3 30.0 - 36.0 g/dL  RDW 12.4 11.5 - 15.5 %   Platelets 212.0 150.0 - 400.0 K/uL   Neutrophils Relative % 65.6 43.0 - 77.0 %   Lymphocytes Relative 27.3 12.0 - 46.0 %   Monocytes Relative 6.1 3.0 - 12.0 %   Eosinophils Relative 0.5 0.0 - 5.0 %   Basophils Relative 0.5 0.0 - 3.0 %   Neutro Abs 4.3 1.4 - 7.7 K/uL   Lymphs Abs 1.8 0.7 - 4.0 K/uL   Monocytes Absolute 0.4 0.1 - 1.0 K/uL   Eosinophils Absolute 0.0 0.0 - 0.7 K/uL   Basophils Absolute 0.0 0.0 - 0.1 K/uL  TSH     Status: None   Collection Time: 05/23/14 12:57 PM  Result Value Ref Range   TSH 2.65 0.35 - 4.50 uIU/mL    Renal Function Panel     Status: None   Collection Time: 05/23/14 12:57 PM  Result Value Ref Range   Sodium 137 135 - 145 mEq/L   Potassium 3.6 3.5 - 5.1 mEq/L   Chloride 106 96 - 112 mEq/L   CO2 24 19 - 32 mEq/L   Calcium 9.1 8.4 - 10.5 mg/dL   Albumin 3.6 3.5 - 5.2 g/dL   BUN 15 6 - 23 mg/dL   Creatinine, Ser 0.5 0.4 - 1.2 mg/dL   Glucose, Bld 86 70 - 99 mg/dL   Phosphorus 3.9 2.3 - 4.6 mg/dL   GFR 161.09133.11 >60.45>60.00 mL/min  Urine cytology ancillary only     Status: None   Collection Time: 07/03/14 12:00 AM  Result Value Ref Range   Chlamydia CT: Negative     Comment: Normal Reference Range - Negative   Neisseria gonorrhea NG: Negative     Comment: Normal Reference Range - Negative   Trich Trichomonas: Negative     Comment: Normal Reference Range - Negative  Urine cytology ancillary only     Status: None   Collection Time: 07/03/14 12:00 AM  Result Value Ref Range   Bacterial vaginitis Negative for Bacterial Vaginitis Microorganisms     Comment: Normal Reference Range - Negative   Candida vaginitis Negative for Candida Vaginitis Microorganisms     Comment: Normal Reference Range - Negative  Urine Culture     Status: None   Collection Time: 07/03/14 11:12 AM  Result Value Ref Range   Colony Count NO GROWTH    Organism ID, Bacteria NO GROWTH   Zinc     Status: None   Collection Time: 07/03/14 11:12 AM  Result Value Ref Range   Zinc 101 60 - 130 mcg/dL  CBC     Status: None   Collection Time: 07/03/14 11:12 AM  Result Value Ref Range   WBC 5.0 4.0 - 10.5 K/uL   RBC 4.27 3.87 - 5.11 Mil/uL   Platelets 242.0 150.0 - 400.0 K/uL   Hemoglobin 13.2 12.0 - 15.0 g/dL   HCT 40.939.8 81.136.0 - 91.446.0 %   MCV 93.3 78.0 - 100.0 fl   MCHC 33.2 30.0 - 36.0 g/dL   RDW 78.212.6 95.611.5 - 21.315.5 %  Renal Function Panel     Status: None   Collection Time: 07/03/14 11:12 AM  Result Value Ref Range   Sodium 136 135 - 145 mEq/L   Potassium 4.1 3.5 - 5.1 mEq/L   Chloride 103 96 - 112 mEq/L   CO2 26 19 -  32 mEq/L   Calcium 9.1 8.4 - 10.5 mg/dL   Albumin 3.5 3.5 - 5.2 g/dL   BUN 14 6 -  23 mg/dL   Creatinine, Ser 0.6 0.4 - 1.2 mg/dL   Glucose, Bld 85 70 - 99 mg/dL   Phosphorus 3.4 2.3 - 4.6 mg/dL   GFR 161.09 >60.45 mL/min  Magnesium     Status: None   Collection Time: 07/03/14 11:12 AM  Result Value Ref Range   Magnesium 1.9 1.5 - 2.5 mg/dL  Urinalysis, Routine w reflex microscopic     Status: Abnormal   Collection Time: 07/03/14 11:12 AM  Result Value Ref Range   Color, Urine YELLOW Yellow;Lt. Yellow   APPearance CLEAR Clear   Specific Gravity, Urine 1.020 1.000-1.030   pH 7.0 5.0 - 8.0   Total Protein, Urine NEGATIVE Negative   Urine Glucose NEGATIVE Negative   Ketones, ur NEGATIVE Negative   Bilirubin Urine NEGATIVE Negative   Hgb urine dipstick MODERATE (A) Negative   Urobilinogen, UA 0.2 0.0 - 1.0   Leukocytes, UA SMALL (A) Negative   Nitrite NEGATIVE Negative   WBC, UA 3-6/hpf (A) 0-2/hpf   RBC / HPF 0-2/hpf 0-2/hpf   Squamous Epithelial / LPF Rare(0-4/hpf) Rare(0-4/hpf)   Bacteria, UA Few(10-50/hpf) (A) None   Assessment/Plan: Acute bacterial sinusitis Rx Augmentin. Increase fluids.  Rest.  Saline nasal spray.  Rx Flonase. Continue Probiotic. Alternate tylenol and ibuprofen for sore throat.  Humidifier in bedroom.  Return precautions discussed with patient.

## 2014-08-04 NOTE — Assessment & Plan Note (Signed)
Rx Augmentin. Increase fluids.  Rest.  Saline nasal spray.  Rx Flonase. Continue Probiotic. Alternate tylenol and ibuprofen for sore throat.  Humidifier in bedroom.  Return precautions discussed with patient.

## 2014-08-04 NOTE — Progress Notes (Signed)
Pre visit review using our clinic review tool, if applicable. No additional management support is needed unless otherwise documented below in the visit note/SLS  

## 2014-08-04 NOTE — Patient Instructions (Signed)
Please take antibiotic as directed.  Increase fluid intake.  Use Saline nasal spray.  Take a daily multivitamin. Use Flonase nasal spray each morning as directed.  Alternate between Tylenol and Ibuprofen for throat pain.  Place a humidifier in the bedroom.  Please call or return clinic if symptoms are not improving.  Sinusitis Sinusitis is redness, soreness, and swelling (inflammation) of the paranasal sinuses. Paranasal sinuses are air pockets within the bones of your face (beneath the eyes, the middle of the forehead, or above the eyes). In healthy paranasal sinuses, mucus is able to drain out, and air is able to circulate through them by way of your nose. However, when your paranasal sinuses are inflamed, mucus and air can become trapped. This can allow bacteria and other germs to grow and cause infection. Sinusitis can develop quickly and last only a short time (acute) or continue over a long period (chronic). Sinusitis that lasts for more than 12 weeks is considered chronic.  CAUSES  Causes of sinusitis include:  Allergies.  Structural abnormalities, such as displacement of the cartilage that separates your nostrils (deviated septum), which can decrease the air flow through your nose and sinuses and affect sinus drainage.  Functional abnormalities, such as when the small hairs (cilia) that line your sinuses and help remove mucus do not work properly or are not present. SYMPTOMS  Symptoms of acute and chronic sinusitis are the same. The primary symptoms are pain and pressure around the affected sinuses. Other symptoms include:  Upper toothache.  Earache.  Headache.  Bad breath.  Decreased sense of smell and taste.  A cough, which worsens when you are lying flat.  Fatigue.  Fever.  Thick drainage from your nose, which often is green and may contain pus (purulent).  Swelling and warmth over the affected sinuses. DIAGNOSIS  Your caregiver will perform a physical exam. During the  exam, your caregiver may:  Look in your nose for signs of abnormal growths in your nostrils (nasal polyps).  Tap over the affected sinus to check for signs of infection.  View the inside of your sinuses (endoscopy) with a special imaging device with a light attached (endoscope), which is inserted into your sinuses. If your caregiver suspects that you have chronic sinusitis, one or more of the following tests may be recommended:  Allergy tests.  Nasal culture A sample of mucus is taken from your nose and sent to a lab and screened for bacteria.  Nasal cytology A sample of mucus is taken from your nose and examined by your caregiver to determine if your sinusitis is related to an allergy. TREATMENT  Most cases of acute sinusitis are related to a viral infection and will resolve on their own within 10 days. Sometimes medicines are prescribed to help relieve symptoms (pain medicine, decongestants, nasal steroid sprays, or saline sprays).  However, for sinusitis related to a bacterial infection, your caregiver will prescribe antibiotic medicines. These are medicines that will help kill the bacteria causing the infection.  Rarely, sinusitis is caused by a fungal infection. In theses cases, your caregiver will prescribe antifungal medicine. For some cases of chronic sinusitis, surgery is needed. Generally, these are cases in which sinusitis recurs more than 3 times per year, despite other treatments. HOME CARE INSTRUCTIONS   Drink plenty of water. Water helps thin the mucus so your sinuses can drain more easily.  Use a humidifier.  Inhale steam 3 to 4 times a day (for example, sit in the bathroom with the  shower running).  Apply a warm, moist washcloth to your face 3 to 4 times a day, or as directed by your caregiver.  Use saline nasal sprays to help moisten and clean your sinuses.  Take over-the-counter or prescription medicines for pain, discomfort, or fever only as directed by your  caregiver. SEEK IMMEDIATE MEDICAL CARE IF:  You have increasing pain or severe headaches.  You have nausea, vomiting, or drowsiness.  You have swelling around your face.  You have vision problems.  You have a stiff neck.  You have difficulty breathing. MAKE SURE YOU:   Understand these instructions.  Will watch your condition.  Will get help right away if you are not doing well or get worse. Document Released: 09/05/2005 Document Revised: 11/28/2011 Document Reviewed: 09/20/2011 [facility] Patient Information 2014 [place], [place].

## 2014-09-04 ENCOUNTER — Other Ambulatory Visit: Payer: Self-pay

## 2014-09-04 DIAGNOSIS — N644 Mastodynia: Secondary | ICD-10-CM

## 2014-09-04 DIAGNOSIS — Z1231 Encounter for screening mammogram for malignant neoplasm of breast: Secondary | ICD-10-CM

## 2014-09-15 ENCOUNTER — Telehealth: Payer: Self-pay | Admitting: Family Medicine

## 2014-09-15 NOTE — Telephone Encounter (Signed)
Caller name: Jackie [name], Jackie [name] Relation to pt: SELF  Call back number: [phone]   Reason for call:  Pt requesting a rx for referral 3D bilateral mamo and ultrasound please fax to [phone]

## 2014-09-15 NOTE — Telephone Encounter (Signed)
error:[id number] ° °

## 2014-09-16 ENCOUNTER — Other Ambulatory Visit: Payer: Self-pay | Admitting: Family Medicine

## 2014-09-16 DIAGNOSIS — Z1239 Encounter for other screening for malignant neoplasm of breast: Secondary | ICD-10-CM

## 2014-09-16 DIAGNOSIS — N644 Mastodynia: Secondary | ICD-10-CM

## 2014-09-24 ENCOUNTER — Other Ambulatory Visit: Payer: Self-pay | Admitting: Family Medicine

## 2014-09-24 DIAGNOSIS — N644 Mastodynia: Secondary | ICD-10-CM

## 2014-10-07 ENCOUNTER — Other Ambulatory Visit: Payer: Self-pay

## 2014-10-10 ENCOUNTER — Other Ambulatory Visit: Payer: Self-pay

## 2014-10-16 ENCOUNTER — Ambulatory Visit (INDEPENDENT_AMBULATORY_CARE_PROVIDER_SITE_OTHER): Payer: 59 | Admitting: Family Medicine

## 2014-10-16 ENCOUNTER — Ambulatory Visit
Admission: RE | Admit: 2014-10-16 | Discharge: 2014-10-16 | Disposition: A | Discharge status: Home / Self Care | Payer: 59 | Source: Ambulatory Visit | Attending: Family Medicine | Admitting: Family Medicine

## 2014-10-16 ENCOUNTER — Ambulatory Visit (HOSPITAL_BASED_OUTPATIENT_CLINIC_OR_DEPARTMENT_OTHER)
Admission: RE | Admit: 2014-10-16 | Discharge: 2014-10-16 | Disposition: A | Discharge status: Home / Self Care | Payer: 59 | Source: Ambulatory Visit | Attending: Family Medicine | Admitting: Family Medicine

## 2014-10-16 ENCOUNTER — Other Ambulatory Visit: Payer: Self-pay | Admitting: Family Medicine

## 2014-10-16 ENCOUNTER — Encounter: Payer: Self-pay | Admitting: Family Medicine

## 2014-10-16 VITALS — BP 115/71 | HR 77 | Temp 98.2°F | Ht 61.75 in | Wt 137.4 lb

## 2014-10-16 DIAGNOSIS — Z1239 Encounter for other screening for malignant neoplasm of breast: Secondary | ICD-10-CM

## 2014-10-16 DIAGNOSIS — M545 Low back pain: Secondary | ICD-10-CM

## 2014-10-16 DIAGNOSIS — J019 Acute sinusitis, unspecified: Secondary | ICD-10-CM

## 2014-10-16 DIAGNOSIS — N644 Mastodynia: Secondary | ICD-10-CM

## 2014-10-16 DIAGNOSIS — B9689 Other specified bacterial agents as the cause of diseases classified elsewhere: Secondary | ICD-10-CM

## 2014-10-16 DIAGNOSIS — M542 Cervicalgia: Secondary | ICD-10-CM | POA: Diagnosis not present

## 2014-10-16 DIAGNOSIS — I889 Nonspecific lymphadenitis, unspecified: Secondary | ICD-10-CM

## 2014-10-16 LAB — HM MAMMOGRAPHY

## 2014-10-16 MED ORDER — CEFDINIR 300 MG PO CAPS: 300.0000 mg | ORAL_CAPSULE | Freq: Two times a day (BID) | ORAL | Status: AC

## 2014-10-16 NOTE — Progress Notes (Signed)
Jackie [name]  [id number] September 14, 1971 10/16/2014      Progress Note-Follow Up  Subjective  Chief Complaint  Chief Complaint  Patient presents with  . Headache    pressure related-chronic(getting worse)  . Back Pain    chronic-(lower) discomfort    HPI  Patient is a 44 y.o. female in today for routine medical care. Patient is in today with numerous complaints. She is struggling with worsening low back pain. No falls or incontinence. No radiculopathy. She also has persistent neck pain and frequent headaches. She has been noting congestion and chills as well as ear pressure and malaise over the last week to 2. Has had some slight nausea but no vomiting. Denies CP/palp/SOBfevers or GU c/o. Taking meds as prescribed  Past Medical History  Diagnosis Date  . Vitamin D deficiency 2012  . Chicken pox 44 yrs old  . Thyroid dysfunction 07/22/2011    history - resolved, no current problem  . Anemia     during pregnancy  . Fibrocystic breast 07/22/2011  . Pleurisy 08/29/2011    History - resolved  . Dysmenorrhea 10/20/2011  . Vaginitis 07/15/2012  . Endometriosis 12/22/2012  . Hyponatremia 12/22/2012  . Tinea versicolor 03/07/2013  . Preventative health care 03/07/2013  . 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 SVD (spontaneous vaginal delivery)     x 1  . Missed abortion     resolved on it's own - no surgery required.  Marland Kitchen. Headache(784.0)     otc med prn  . Anemia, unspecified 04/27/2014  . Hair loss 07/03/2014    Past Surgical History  Procedure Laterality Date  . Dental surgery  11/2013  . Laparoscopic supracervical hysterectomy N/A 12/17/2013    Procedure: LAPAROSCOPIC  SUPRACERVICAL HYSTERECTOMY WITH BILATERAL SALPINGECTOMY;  Surgeon: Oliver PilaKathy W Richardson, MD;  Location: [facility];  Service: Gynecology;  Laterality: N/A;  2hrs OR time    Family History  Problem Relation Age of Onset  . Diabetes Mother     type 2  . Stroke Mother   . Osteoporosis Mother   . Heart  disease Mother     angina  . Heart attack Father     MI in his 4450s  . Diabetes Father     type 2  . Obesity Sister   . Diabetes Sister   . Obesity Brother   . Diabetes Brother     type 2  . Aneurysm Maternal Grandmother   . Heart attack Maternal Grandfather   . Pneumonia Paternal Grandfather   . Hypertension Sister     induced  . Other Sister     anemic  . Anemia Sister   . Depression Sister     History   Social History  . Marital Status: Married    Spouse Name: N/A    Number of Children: 1  . Years of Education: N/A   Occupational History  . Not on file.   Social History Main Topics  . Smoking status: Never Smoker   . Smokeless tobacco: Never Used  . Alcohol Use: Yes     Comment: 1 to 2 glasses 5 times a week. but then the next week maybe nothing  . Drug Use: No  . Sexual Activity:    Partners: Male    Birth Control/ Protection: None   Other Topics Concern  . Not on file   Social History Narrative    Current Outpatient Prescriptions on File Prior to Visit  Medication Sig Dispense Refill  .  Flaxseed, Linseed, (FLAX SEED OIL PO) Take by mouth.    . lactobacillus acidophilus (BACID) TABS tablet Take 1 tablet by mouth daily.    . Multiple Vitamin (MULTIVITAMIN) tablet Take 1 tablet by mouth daily.       No current facility-administered medications on file prior to visit.    Allergies  Allergen Reactions  . Nizoral [Ketoconazole]     Burning and dry skin    Review of Systems  Review of Systems  Constitutional: Positive for chills and malaise/fatigue. Negative for fever.  HENT: Positive for congestion.   Eyes: Negative for photophobia and discharge.  Respiratory: Negative for shortness of breath.   Cardiovascular: Negative for chest pain, palpitations and leg swelling.  Gastrointestinal: Negative for nausea, abdominal pain and diarrhea.  Genitourinary: Negative for dysuria.  Musculoskeletal: Positive for myalgias, back pain and neck pain. Negative  for joint pain and falls.  Skin: Negative for rash.  Neurological: Positive for headaches. Negative for loss of consciousness.  Endo/Heme/Allergies: Negative for polydipsia.  Psychiatric/Behavioral: Negative for depression and suicidal ideas. The patient is not nervous/anxious and does not have insomnia.     Objective  BP 115/71 mmHg  Pulse 77  Temp(Src) 98.2 F (36.8 C) (Oral)  Ht 5' 1.75" (1.568 m)  Wt 137 lb 6.4 oz (62.324 kg)  BMI 25.35 kg/m2  SpO2 99%  LMP 12/03/2013  Physical Exam  Physical Exam  Constitutional: She is oriented to person, place, and time and well-developed, well-nourished, and in no distress. No distress.  HENT:  Head: Normocephalic and atraumatic.  TMs dull and retracted, nasal mucosa boggy and erythematous  Eyes: Conjunctivae are normal.  Neck: Neck supple. No thyromegaly present.  Cardiovascular: Normal rate, regular rhythm and normal heart sounds.   No murmur heard. Pulmonary/Chest: Effort normal and breath sounds normal. She has no wheezes.  Abdominal: She exhibits no distension and no mass.  Musculoskeletal: She exhibits no edema.  Lymphadenopathy:    She has cervical adenopathy.  Neurological: She is alert and oriented to person, place, and time.  Skin: Skin is warm and dry. No rash noted. She is not diaphoretic.  Psychiatric: Memory, affect and judgment normal.    Lab Results  Component Value Date   TSH 2.65 05/23/2014   Lab Results  Component Value Date   WBC 5.0 07/03/2014   HGB 13.2 07/03/2014   HCT 39.8 07/03/2014   MCV 93.3 07/03/2014   PLT 242.0 07/03/2014   Lab Results  Component Value Date   CREATININE 0.6 07/03/2014   BUN 14 07/03/2014   NA 136 07/03/2014   K 4.1 07/03/2014   CL 103 07/03/2014   CO2 26 07/03/2014   Lab Results  Component Value Date   ALT 17 05/23/2014   AST 23 05/23/2014   ALKPHOS 48 05/23/2014   BILITOT 0.5 05/23/2014   Lab Results  Component Value Date   CHOL 186 05/23/2014   Lab Results   Component Value Date   HDL 69.20 05/23/2014   Lab Results  Component Value Date   LDLCALC 87 05/23/2014   Lab Results  Component Value Date   TRIG 148.0 05/23/2014   Lab Results  Component Value Date   CHOLHDL 3 05/23/2014     Assessment & Plan  Acute bacterial sinusitis Encouraged increased rest and hydration, add probiotics, zinc such as Coldeze or Xicam. Treat fevers as needed. Started on Cefdinir   Neck pain, musculoskeletal No acute findings, encouraged moist heat and gentle stretching. Xray shows mild foraminal  narrowing but no major concerns.    Low back pain Encouraged moist heat and gentle stretching as tolerated. May try NSAIDs and prescription meds as directed and report if symptoms worsen or seek immediate care. Xray unremarkable. Consider massage, chiropractic if no improvement. Referred to physical therapy

## 2014-10-16 NOTE — Progress Notes (Signed)
Pre visit review using our clinic review tool, if applicable. No additional management support is needed unless otherwise documented below in the visit note. 

## 2014-10-16 NOTE — Patient Instructions (Addendum)
Back Pain, Adult Low back pain is very common. About 1 in 5 people have back pain.The cause of low back pain is rarely dangerous. The pain often gets better over time.About half of people with a sudden onset of back pain feel better in just 2 weeks. About 8 in 10 people feel better by 6 weeks.  CAUSES Some common causes of back pain include:  Strain of the muscles or ligaments supporting the spine.  Wear and tear (degeneration) of the spinal discs.  Arthritis.  Direct injury to the back. DIAGNOSIS Most of the time, the direct cause of low back pain is not known.However, back pain can be treated effectively even when the exact cause of the pain is unknown.Answering your caregiver's questions about your overall health and symptoms is one of the most accurate ways to make sure the cause of your pain is not dangerous. If your caregiver needs more information, he or she may order lab work or imaging tests (X-rays or MRIs).However, even if imaging tests show changes in your back, this usually does not require surgery. HOME CARE INSTRUCTIONS For many people, back pain returns.Since low back pain is rarely dangerous, it is often a condition that people can learn to manageon their own.   Remain active. It is stressful on the back to sit or stand in one place. Do not sit, drive, or stand in one place for more than 30 minutes at a time. Take short walks on level surfaces as soon as pain allows.Try to increase the length of time you walk each day.  Do not stay in bed.Resting more than 1 or 2 days can delay your recovery.  Do not avoid exercise or work.Your body is made to move.It is not dangerous to be active, even though your back may hurt.Your back will likely heal faster if you return to being active before your pain is gone.  Pay attention to your body when you bend and lift. Many people have less discomfortwhen lifting if they bend their knees, keep the load close to their bodies,and  avoid twisting. Often, the most comfortable positions are those that put less stress on your recovering back.  Find a comfortable position to sleep. Use a firm mattress and lie on your side with your knees slightly bent. If you lie on your back, put a pillow under your knees.  Only take over-the-counter or prescription medicines as directed by your caregiver. Over-the-counter medicines to reduce pain and inflammation are often the most helpful.Your caregiver may prescribe muscle relaxant drugs.These medicines help dull your pain so you can more quickly return to your normal activities and healthy exercise.  Put ice on the injured area.  Put ice in a plastic bag.  Place a towel between your skin and the bag.  Leave the ice on for 15-20 minutes, 03-04 times a day for the first 2 to 3 days. After that, ice and heat may be alternated to reduce pain and spasms.  Ask your caregiver about trying back exercises and gentle massage. This may be of some benefit.  Avoid feeling anxious or stressed.Stress increases muscle tension and can worsen back pain.It is important to recognize when you are anxious or stressed and learn ways to manage it.Exercise is a great option. SEEK MEDICAL CARE IF:  You have pain that is not relieved with rest or medicine.  You have pain that does not improve in 1 week.  You have new symptoms.  You are generally not feeling well. SEEK   IMMEDIATE MEDICAL CARE IF:   You have pain that radiates from your back into your legs.  You develop new bowel or bladder control problems.  You have unusual weakness or numbness in your arms or legs.  You develop nausea or vomiting.  You develop abdominal pain.  You feel faint. Document Released: 09/05/2005 Document Revised: 03/06/2012 Document Reviewed: 01/07/2014 [employer] Patient Information 2015 [employer]. This information is not intended to replace advice given to you by your health care provider. Make sure you  discuss any questions you have with your health care provider.  

## 2014-10-17 LAB — CBC
HCT: 38 % (ref 36.0–46.0)
Hemoglobin: 13.1 g/dL (ref 12.0–15.0)
MCHC: 34.3 g/dL (ref 30.0–36.0)
MCV: 92 fl (ref 78.0–100.0)
Platelets: 229 10*3/uL (ref 150.0–400.0)
RBC: 4.13 Mil/uL (ref 3.87–5.11)
RDW: 12 % (ref 11.5–15.5)
WBC: 7.7 10*3/uL (ref 4.0–10.5)

## 2014-10-21 ENCOUNTER — Telehealth: Payer: Self-pay | Admitting: Family Medicine

## 2014-10-21 NOTE — Telephone Encounter (Signed)
Caller name: Kendall FlackLaporte, Mollie Relation to pt: self  Call back number: [phone] Pharmacy:  [id number] [place] - [address] [phone] (Phone) [phone] (Fax)     Reason for call: pt states cefdinir (OMNICEF) 300 MG capsule exp. runny stomach and making her [place]. Pt stopped taking medication. Please advise

## 2014-10-26 ENCOUNTER — Encounter: Payer: Self-pay | Admitting: Family Medicine

## 2014-10-26 DIAGNOSIS — M545 Low back pain, unspecified: Secondary | ICD-10-CM | POA: Insufficient documentation

## 2014-10-26 HISTORY — DX: Low back pain: M54.5

## 2014-10-26 HISTORY — DX: Low back pain, unspecified: M54.50

## 2014-10-26 NOTE — Assessment & Plan Note (Addendum)
Encouraged moist heat and gentle stretching as tolerated. May try NSAIDs and prescription meds as directed and report if symptoms worsen or seek immediate care. Xray unremarkable. Consider massage, chiropractic if no improvement. Referred to physical therapy

## 2014-10-26 NOTE — Assessment & Plan Note (Signed)
No acute findings, encouraged moist heat and gentle stretching. Xray shows mild foraminal narrowing but no major concerns.

## 2014-10-26 NOTE — Assessment & Plan Note (Signed)
Encouraged increased rest and hydration, add probiotics, zinc such as Coldeze or Xicam. Treat fevers as needed. Started on Cefdinir 

## 2014-12-02 ENCOUNTER — Telehealth: Payer: Self-pay | Admitting: Family Medicine

## 2014-12-02 NOTE — Telephone Encounter (Signed)
error:[id number] ° °

## 2014-12-03 ENCOUNTER — Ambulatory Visit (INDEPENDENT_AMBULATORY_CARE_PROVIDER_SITE_OTHER): Payer: 59 | Admitting: Medical

## 2014-12-03 ENCOUNTER — Encounter: Payer: Self-pay | Admitting: Medical

## 2014-12-03 VITALS — BP 116/79 | HR 80 | Temp 97.9°F | Ht 61.75 in | Wt 139.2 lb

## 2014-12-03 DIAGNOSIS — M546 Pain in thoracic spine: Secondary | ICD-10-CM

## 2014-12-03 DIAGNOSIS — M549 Dorsalgia, unspecified: Secondary | ICD-10-CM | POA: Insufficient documentation

## 2014-12-03 NOTE — Assessment & Plan Note (Addendum)
I would recommend continue PT and getting tspine xray. Would recommend you discuss medications options. Discuss further tx options with Dr. Abner [name].  Also discuss further labs as well regarding mosquito work up. I don't thinks this is indicated today.  Also counseled pt that if further work up such as mri needed in future that initial xray of region would be helpful.  She states she will investigate if chiropracter ever imaged tspine.

## 2014-12-03 NOTE — Progress Notes (Signed)
Subjective:    Patient ID: Jackie [name], female    DOB: 08-29-1971, 44 y.o.   MRN: [id number]  HPI   Pt in states she has hx of lower back pain issues in the past with some trapezius tightness. Xray of lspine normal. Mild neuro foraminal narrowing on cspine xray.  Pt pain for many months. But pain excacerbated in December.   Pt has been seen by chiropracter.   Pt does not report any obvious radicular pain to her upper extremity.  No upper ext weakness. With recent PT her neck and trapezius symptom helped a lot. Her lower back pain is also much improved presently.  Now pt states upper back t-spine area mild pressure and vary faint mild numbness sensation over upper mid t spine. This discomfort is within past 2 weeks.   Pt has some stressful life situations and she wanders if this is impacting her pain.  Pt wonder if her back pain could be from history of chronic mosqutioe bites. Pt has not done any traveling outside of country since 2007. Most over symptoms area reported to be related to neck and back pain.   LMP- partial hysterectomy.   Pt has been to PT about 6. She is feeling better.      Review of Systems  Constitutional: Negative for fever, chills and fatigue.  HENT: Negative for congestion and drooling.   Respiratory: Negative for cough, chest tightness, shortness of breath and wheezing.   Cardiovascular: Negative for chest pain and palpitations.  Gastrointestinal: Negative.        But reports some occasional constipation in the past. Which causes some mild lower back discomfort.  Musculoskeletal: Positive for back pain.       Tspine pain today.  Neurological: Negative for dizziness, seizures, syncope, speech difficulty, weakness, light-headedness, numbness and headaches.  Hematological: Negative for adenopathy. Does not bruise/bleed easily.   Past Medical History  Diagnosis Date  . Vitamin D deficiency 2012  . Chicken pox 44 yrs old  . Thyroid dysfunction  07/22/2011    history - resolved, no current problem  . Anemia     during pregnancy  . Fibrocystic breast 07/22/2011  . Pleurisy 08/29/2011    History - resolved  . Dysmenorrhea 10/20/2011  . Vaginitis 07/15/2012  . Endometriosis 12/22/2012  . Hyponatremia 12/22/2012  . Tinea versicolor 03/07/2013  . Preventative health care 03/07/2013  . 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 SVD (spontaneous vaginal delivery)     x 1  . Missed abortion     resolved on it's own - no surgery required.  Marland Kitchen. Headache(784.0)     otc med prn  . Anemia, unspecified 04/27/2014  . Hair loss 07/03/2014  . Low back pain 10/26/2014    History   Social History  . Marital Status: Married    Spouse Name: N/A  . Number of Children: 1  . Years of Education: N/A   Occupational History  . Not on file.   Social History Main Topics  . Smoking status: Never Smoker   . Smokeless tobacco: Never Used  . Alcohol Use: Yes     Comment: 1 to 2 glasses 5 times a week. but then the next week maybe nothing  . Drug Use: No  . Sexual Activity:    Partners: Male    Birth Control/ Protection: None   Other Topics Concern  . Not on file   Social History Narrative    Past Surgical History  Procedure  Laterality Date  . Dental surgery  11/2013  . Laparoscopic supracervical hysterectomy N/A 12/17/2013    Procedure: LAPAROSCOPIC  SUPRACERVICAL HYSTERECTOMY WITH BILATERAL SALPINGECTOMY;  Surgeon: Oliver Pila, MD;  Location: [facility];  Service: Gynecology;  Laterality: N/A;  2hrs OR time    Family History  Problem Relation Age of Onset  . Diabetes Mother     type 2  . Stroke Mother   . Osteoporosis Mother   . Heart disease Mother     angina  . Heart attack Father     MI in his 16s  . Diabetes Father     type 2  . Obesity Sister   . Diabetes Sister   . Obesity Brother   . Diabetes Brother     type 2  . Aneurysm Maternal Grandmother   . Heart attack Maternal Grandfather   . Pneumonia Paternal  Grandfather   . Hypertension Sister     induced  . Other Sister     anemic  . Anemia Sister   . Depression Sister     Allergies  Allergen Reactions  . Nizoral [Ketoconazole]     Burning and dry skin    Current Outpatient Prescriptions on File Prior to Visit  Medication Sig Dispense Refill  . Flaxseed, Linseed, (FLAX SEED OIL PO) Take by mouth.    . lactobacillus acidophilus (BACID) TABS tablet Take 1 tablet by mouth daily.    . Multiple Vitamin (MULTIVITAMIN) tablet Take 1 tablet by mouth daily.       No current facility-administered medications on file prior to visit.    BP 116/79 mmHg  Pulse 80  Temp(Src) 97.9 F (36.6 C) (Oral)  Ht 5' 1.75" (1.568 m)  Wt 139 lb 3.2 oz (63.141 kg)  BMI 25.68 kg/m2  SpO2 100%  LMP 12/03/2013      Objective:   Physical Exam  General- No acute distress. Pleasant patient. Neck- Full range of motion, no jvd Lungs- Clear, even and unlabored. Heart- regular rate and rhythm. Neurologic- CNII- XII grossly intact. Neck- no midcervical spine pain. Lower back- no mid cervical spine pain today. Thoracic spin- mid upper level faint mild tender.  Upper ext- 5/5 equal and symmetric strength. Sharp and dull discrimination intact.      Assessment & Plan:

## 2014-12-03 NOTE — Patient Instructions (Signed)
Back pain I would recommend continue PT and getting tspine xray. Would recommend you discuss medications options. Discuss further tx options with Dr. Abner GreenspanBlyth.  Also discuss further labs as well regarding mosquito work up.    Follow up with Dr. Abner GreenspanBlyth in early April.

## 2014-12-03 NOTE — Progress Notes (Signed)
Pre visit review using our clinic review tool, if applicable. No additional management support is needed unless otherwise documented below in the visit note. 

## 2014-12-05 ENCOUNTER — Telehealth: Payer: Self-pay | Admitting: Family Medicine

## 2014-12-05 NOTE — Telephone Encounter (Signed)
Pre Visit letter sent  °

## 2014-12-10 ENCOUNTER — Ambulatory Visit (HOSPITAL_BASED_OUTPATIENT_CLINIC_OR_DEPARTMENT_OTHER)
Admission: RE | Admit: 2014-12-10 | Discharge: 2014-12-10 | Disposition: A | Discharge status: Home / Self Care | Payer: 59 | Source: Ambulatory Visit | Attending: Medical | Admitting: Medical

## 2014-12-10 DIAGNOSIS — M546 Pain in thoracic spine: Secondary | ICD-10-CM | POA: Insufficient documentation

## 2014-12-10 DIAGNOSIS — R200 Anesthesia of skin: Secondary | ICD-10-CM | POA: Diagnosis not present

## 2014-12-10 DIAGNOSIS — M47894 Other spondylosis, thoracic region: Secondary | ICD-10-CM | POA: Diagnosis not present

## 2014-12-24 ENCOUNTER — Encounter: Payer: Self-pay | Admitting: *Deleted

## 2014-12-24 ENCOUNTER — Telehealth: Payer: Self-pay | Admitting: *Deleted

## 2014-12-24 NOTE — Addendum Note (Signed)
Addended by: Noreene LarssonLARSON, Jaiyon Wander A on: 12/24/2014 02:49 PM   Modules accepted: Orders, Medications

## 2014-12-24 NOTE — Telephone Encounter (Signed)
Unable to reach patient at time of Pre-Visit Call.  Left message for patient to return call when available.    

## 2014-12-24 NOTE — Telephone Encounter (Signed)
Pre-Visit Call completed with patient and chart updated.   Pre-Visit Info documented in Specialty Comments under [employer].    

## 2014-12-24 NOTE — Telephone Encounter (Signed)
Patient returned phone call. Best # [phone]

## 2014-12-25 ENCOUNTER — Ambulatory Visit (INDEPENDENT_AMBULATORY_CARE_PROVIDER_SITE_OTHER): Payer: 59 | Admitting: Family Medicine

## 2014-12-25 ENCOUNTER — Encounter: Payer: Self-pay | Admitting: Family Medicine

## 2014-12-25 VITALS — BP 110/74 | HR 71 | Temp 98.0°F | Resp 16 | Ht 61.0 in | Wt 135.0 lb

## 2014-12-25 DIAGNOSIS — R109 Unspecified abdominal pain: Secondary | ICD-10-CM | POA: Diagnosis not present

## 2014-12-25 DIAGNOSIS — D509 Iron deficiency anemia, unspecified: Secondary | ICD-10-CM | POA: Diagnosis not present

## 2014-12-25 DIAGNOSIS — E559 Vitamin D deficiency, unspecified: Secondary | ICD-10-CM

## 2014-12-25 DIAGNOSIS — Z0000 Encounter for general adult medical examination without abnormal findings: Secondary | ICD-10-CM

## 2014-12-25 DIAGNOSIS — R7989 Other specified abnormal findings of blood chemistry: Secondary | ICD-10-CM

## 2014-12-25 DIAGNOSIS — R946 Abnormal results of thyroid function studies: Secondary | ICD-10-CM | POA: Diagnosis not present

## 2014-12-25 DIAGNOSIS — E871 Hypo-osmolality and hyponatremia: Secondary | ICD-10-CM

## 2014-12-25 LAB — CBC
HCT: 42.3 % (ref 36.0–46.0)
Hemoglobin: 14.5 g/dL (ref 12.0–15.0)
MCHC: 34.3 g/dL (ref 30.0–36.0)
MCV: 92.1 fl (ref 78.0–100.0)
Platelets: 277 10*3/uL (ref 150.0–400.0)
RBC: 4.6 Mil/uL (ref 3.87–5.11)
RDW: 12.4 % (ref 11.5–15.5)
WBC: 7.3 10*3/uL (ref 4.0–10.5)

## 2014-12-25 LAB — COMPREHENSIVE METABOLIC PANEL
ALT: 20 U/L (ref 0–35)
AST: 26 U/L (ref 0–37)
Albumin: 4.2 g/dL (ref 3.5–5.2)
Alkaline Phosphatase: 52 U/L (ref 39–117)
BUN: 13 mg/dL (ref 6–23)
CO2: 28 mEq/L (ref 19–32)
Calcium: 9.6 mg/dL (ref 8.4–10.5)
Chloride: 100 mEq/L (ref 96–112)
Creatinine, Ser: 0.61 mg/dL (ref 0.40–1.20)
GFR: 112.87 mL/min (ref >60.00)
Glucose, Bld: 78 mg/dL (ref 70–99)
Potassium: 4.3 mEq/L (ref 3.5–5.1)
Sodium: 134 mEq/L — ABNORMAL LOW (ref 135–145)
Total Bilirubin: 0.5 mg/dL (ref 0.2–1.2)
Total Protein: 7.7 g/dL (ref 6.0–8.3)

## 2014-12-25 LAB — LIPID PANEL
Cholesterol: 224 mg/dL — ABNORMAL HIGH (ref 0–200)
HDL: 90 mg/dL (ref >39.00)
LDL Cholesterol: 110 mg/dL — ABNORMAL HIGH (ref 0–99)
NonHDL: 134
Total CHOL/HDL Ratio: 2
Triglycerides: 120 mg/dL (ref 0.0–149.0)
VLDL: 24 mg/dL (ref 0.0–40.0)

## 2014-12-25 LAB — TSH: TSH: 2.98 u[IU]/mL (ref 0.35–4.50)

## 2014-12-25 LAB — VITAMIN D 25 HYDROXY (VIT D DEFICIENCY, FRACTURES): VITD: 24.14 ng/mL — ABNORMAL LOW (ref 30.00–100.00)

## 2014-12-25 NOTE — Assessment & Plan Note (Signed)
Check TSH today

## 2014-12-25 NOTE — Assessment & Plan Note (Signed)
Check CMP today, asymptomatic 

## 2014-12-25 NOTE — Assessment & Plan Note (Signed)
Increase leafy greens, consider increased lean red meat and using cast iron cookware. Continue to monitor, report any concerns 

## 2014-12-25 NOTE — Assessment & Plan Note (Addendum)
Patient encouraged to maintain heart healthy diet, regular exercise, adequate sleep. Consider daily probiotics. Take medications as prescribed. Annual labs are ordered and reviewed.

## 2014-12-25 NOTE — Patient Instructions (Addendum)
Moist heat and gentle stretching and [place] patches and/or gel as needed Let us know if you would like to try medications.   Preventive Care for Adults A healthy lifestyle and preventive care can promote health and wellness. Preventive health guidelines for women include the following key practices.  A routine yearly physical is a good way to check with your health care provider about your health and preventive screening. It is a chance to share any concerns and updates on your health and to receive a thorough exam.  Visit your dentist for a routine exam and preventive care every 6 months. Brush your teeth twice a day and floss once a day. Good oral hygiene prevents tooth decay and gum disease.  The frequency of eye exams is based on your age, health, family medical history, use of contact lenses, and other factors. Follow your health care provider's recommendations for frequency of eye exams.  Eat a healthy diet. Foods like vegetables, fruits, whole grains, low-fat dairy products, and lean protein foods contain the nutrients you need without too many calories. Decrease your intake of foods high in solid fats, added sugars, and salt. Eat the right amount of calories for you.Get information about a proper diet from your health care provider, if necessary.  Regular physical exercise is one of the most important things you can do for your health. Most adults should get at least 150 minutes of moderate-intensity exercise (any activity that increases your heart rate and causes you to sweat) each week. In addition, most adults need muscle-strengthening exercises on 2 or more days a week.  Maintain a healthy weight. The body mass index (BMI) is a screening tool to identify possible weight problems. It provides an estimate of body fat based on height and weight. Your health care provider can find your BMI and can help you achieve or maintain a healthy weight.For adults 20 years and older:  A BMI  below 18.5 is considered underweight.  A BMI of 18.5 to 24.9 is normal.  A BMI of 25 to 29.9 is considered overweight.  A BMI of 30 and above is considered obese.  Maintain normal blood lipids and cholesterol levels by exercising and minimizing your intake of saturated fat. Eat a balanced diet with plenty of fruit and vegetables. Blood tests for lipids and cholesterol should begin at age 76 and be repeated every 5 years. If your lipid or cholesterol levels are high, you are over 50, or you are at high risk for heart disease, you may need your cholesterol levels checked more frequently.Ongoing high lipid and cholesterol levels should be treated with medicines if diet and exercise are not working.  If you smoke, find out from your health care provider how to quit. If you do not use tobacco, do not start.  Lung cancer screening is recommended for adults aged 90-80 years who are at high risk for developing lung cancer because of a history of smoking. A yearly low-dose CT scan of the lungs is recommended for people who have at least a 30-pack-year history of smoking and are a current smoker or have quit within the past 15 years. A pack year of smoking is smoking an average of 1 pack of cigarettes a day for 1 year (for example: 1 pack a day for 30 years or 2 packs a day for 15 years). Yearly screening should continue until the smoker has stopped smoking for at least 15 years. Yearly screening should be stopped for people who develop  a health problem that would prevent them from having lung cancer treatment.  If you are pregnant, do not drink alcohol. If you are breastfeeding, be very cautious about drinking alcohol. If you are not pregnant and choose to drink alcohol, do not have more than 1 drink per day. One drink is considered to be 12 ounces (355 mL) of beer, 5 ounces (148 mL) of wine, or 1.5 ounces (44 mL) of liquor.  Avoid use of street drugs. Do not share needles with anyone. Ask for help if you  need support or instructions about stopping the use of drugs.  High blood pressure causes heart disease and increases the risk of stroke. Your blood pressure should be checked at least every 1 to 2 years. Ongoing high blood pressure should be treated with medicines if weight loss and exercise do not work.  If you are 13-3 years old, ask your health care provider if you should take aspirin to prevent strokes.  Diabetes screening involves taking a blood sample to check your fasting blood sugar level. This should be done once every 3 years, after age 26, if you are within normal weight and without risk factors for diabetes. Testing should be considered at a younger age or be carried out more frequently if you are overweight and have at least 1 risk factor for diabetes.  Breast cancer screening is essential preventive care for women. You should practice "breast self-awareness." This means understanding the normal appearance and feel of your breasts and may include breast self-examination. Any changes detected, no matter how small, should be reported to a health care provider. Women in their 70s and 30s should have a clinical breast exam (CBE) by a health care provider as part of a regular health exam every 1 to 3 years. After age 37, women should have a CBE every year. Starting at age 61, women should consider having a mammogram (breast X-ray test) every year. Women who have a family history of breast cancer should talk to their health care provider about genetic screening. Women at a high risk of breast cancer should talk to their health care providers about having an MRI and a mammogram every year.  Breast cancer gene (BRCA)-related cancer risk assessment is recommended for women who have family members with BRCA-related cancers. BRCA-related cancers include breast, ovarian, tubal, and peritoneal cancers. Having family members with these cancers may be associated with an increased risk for harmful changes  (mutations) in the breast cancer genes BRCA1 and BRCA2. Results of the assessment will determine the need for genetic counseling and BRCA1 and BRCA2 testing.  Routine pelvic exams to screen for cancer are no longer recommended for nonpregnant women who are considered low risk for cancer of the pelvic organs (ovaries, uterus, and vagina) and who do not have symptoms. Ask your health care provider if a screening pelvic exam is right for you.  If you have had past treatment for cervical cancer or a condition that could lead to cancer, you need Pap tests and screening for cancer for at least 20 years after your treatment. If Pap tests have been discontinued, your risk factors (such as having a new sexual partner) need to be reassessed to determine if screening should be resumed. Some women have medical problems that increase the chance of getting cervical cancer. In these cases, your health care provider may recommend more frequent screening and Pap tests.  The HPV test is an additional test that may be used for cervical cancer screening.  The HPV test looks for the virus that can cause the cell changes on the cervix. The cells collected during the Pap test can be tested for HPV. The HPV test could be used to screen women aged 87 years and older, and should be used in women of any age who have unclear Pap test results. After the age of 65, women should have HPV testing at the same frequency as a Pap test.  Colorectal cancer can be detected and often prevented. Most routine colorectal cancer screening begins at the age of 70 years and continues through age 69 years. However, your health care provider may recommend screening at an earlier age if you have risk factors for colon cancer. On a yearly basis, your health care provider may provide home test kits to check for hidden blood in the stool. Use of a small camera at the end of a tube, to directly examine the colon (sigmoidoscopy or colonoscopy), can detect the  earliest forms of colorectal cancer. Talk to your health care provider about this at age 93, when routine screening begins. Direct exam of the colon should be repeated every 5-10 years through age 63 years, unless early forms of pre-cancerous polyps or small growths are found.  People who are at an increased risk for hepatitis B should be screened for this virus. You are considered at high risk for hepatitis B if:  You were born in a country where hepatitis B occurs often. Talk with your health care provider about which countries are considered high risk.  Your parents were born in a high-risk country and you have not received a shot to protect against hepatitis B (hepatitis B vaccine).  You have HIV or AIDS.  You use needles to inject street drugs.  You live with, or have sex with, someone who has hepatitis B.  You get hemodialysis treatment.  You take certain medicines for conditions like cancer, organ transplantation, and autoimmune conditions.  Hepatitis C blood testing is recommended for all people born from [birth year] through 1965 and any individual with known risks for hepatitis C.  Practice safe sex. Use condoms and avoid high-risk sexual practices to reduce the spread of sexually transmitted infections (STIs). STIs include gonorrhea, chlamydia, syphilis, trichomonas, herpes, HPV, and human immunodeficiency virus (HIV). Herpes, HIV, and HPV are viral illnesses that have no cure. They can result in disability, cancer, and death.  You should be screened for sexually transmitted illnesses (STIs) including gonorrhea and chlamydia if:  You are sexually active and are younger than 24 years.  You are older than 24 years and your health care provider tells you that you are at risk for this type of infection.  Your sexual activity has changed since you were last screened and you are at an increased risk for chlamydia or gonorrhea. Ask your health care provider if you are at risk.  If you are  at risk of being infected with HIV, it is recommended that you take a prescription medicine daily to prevent HIV infection. This is called preexposure prophylaxis (PrEP). You are considered at risk if:  You are a heterosexual woman, are sexually active, and are at increased risk for HIV infection.  You take drugs by injection.  You are sexually active with a partner who has HIV.  Talk with your health care provider about whether you are at high risk of being infected with HIV. If you choose to begin PrEP, you should first be tested for HIV. You should then be  tested every 3 months for as long as you are taking PrEP.  Osteoporosis is a disease in which the bones lose minerals and strength with aging. This can result in serious bone fractures or breaks. The risk of osteoporosis can be identified using a bone density scan. Women ages 14 years and over and women at risk for fractures or osteoporosis should discuss screening with their health care providers. Ask your health care provider whether you should take a calcium supplement or vitamin D to reduce the rate of osteoporosis.  Menopause can be associated with physical symptoms and risks. Hormone replacement therapy is available to decrease symptoms and risks. You should talk to your health care provider about whether hormone replacement therapy is right for you.  Use sunscreen. Apply sunscreen liberally and repeatedly throughout the day. You should seek shade when your shadow is shorter than you. Protect yourself by wearing long sleeves, pants, a wide-brimmed hat, and sunglasses year round, whenever you are outdoors.  Once a month, do a whole body skin exam, using a mirror to look at the skin on your back. Tell your health care provider of new moles, moles that have irregular borders, moles that are larger than a pencil eraser, or moles that have changed in shape or color.  Stay current with required vaccines (immunizations).  Influenza vaccine.  All adults should be immunized every year.  Tetanus, diphtheria, and acellular pertussis (Td, Tdap) vaccine. Pregnant women should receive 1 dose of Tdap vaccine during each pregnancy. The dose should be obtained regardless of the length of time since the last dose. Immunization is preferred during the 27th-36th week of gestation. An adult who has not previously received Tdap or who does not know her vaccine status should receive 1 dose of Tdap. This initial dose should be followed by tetanus and diphtheria toxoids (Td) booster doses every 10 years. Adults with an unknown or incomplete history of completing a 3-dose immunization series with Td-containing vaccines should begin or complete a primary immunization series including a Tdap dose. Adults should receive a Td booster every 10 years.  Varicella vaccine. An adult without evidence of immunity to varicella should receive 2 doses or a second dose if she has previously received 1 dose. Pregnant females who do not have evidence of immunity should receive the first dose after pregnancy. This first dose should be obtained before leaving the health care facility. The second dose should be obtained 4-8 weeks after the first dose.  Human papillomavirus (HPV) vaccine. Females aged 13-26 years who have not received the vaccine previously should obtain the 3-dose series. The vaccine is not recommended for use in pregnant females. However, pregnancy testing is not needed before receiving a dose. If a female is found to be pregnant after receiving a dose, no treatment is needed. In that case, the remaining doses should be delayed until after the pregnancy. Immunization is recommended for any person with an immunocompromised condition through the age of 42 years if she did not get any or all doses earlier. During the 3-dose series, the second dose should be obtained 4-8 weeks after the first dose. The third dose should be obtained 24 weeks after the first dose and 16  weeks after the second dose.  Zoster vaccine. One dose is recommended for adults aged 27 years or older unless certain conditions are present.  Measles, mumps, and rubella (MMR) vaccine. Adults born before [birth year] generally are considered immune to measles and mumps. Adults born in [birth year] or later  should have 1 or more doses of MMR vaccine unless there is a contraindication to the vaccine or there is laboratory evidence of immunity to each of the three diseases. A routine second dose of MMR vaccine should be obtained at least 28 days after the first dose for students attending postsecondary schools, health care workers, or international travelers. People who received inactivated measles vaccine or an unknown type of measles vaccine during 1963-1967 should receive 2 doses of MMR vaccine. People who received inactivated mumps vaccine or an unknown type of mumps vaccine before 1979 and are at high risk for mumps infection should consider immunization with 2 doses of MMR vaccine. For females of childbearing age, rubella immunity should be determined. If there is no evidence of immunity, females who are not pregnant should be vaccinated. If there is no evidence of immunity, females who are pregnant should delay immunization until after pregnancy. Unvaccinated health care workers born before [birth year] who lack laboratory evidence of measles, mumps, or rubella immunity or laboratory confirmation of disease should consider measles and mumps immunization with 2 doses of MMR vaccine or rubella immunization with 1 dose of MMR vaccine.  Pneumococcal 13-valent conjugate (PCV13) vaccine. When indicated, a person who is uncertain of her immunization history and has no record of immunization should receive the PCV13 vaccine. An adult aged 13 years or older who has certain medical conditions and has not been previously immunized should receive 1 dose of PCV13 vaccine. This PCV13 should be followed with a dose of pneumococcal  polysaccharide (PPSV23) vaccine. The PPSV23 vaccine dose should be obtained at least 8 weeks after the dose of PCV13 vaccine. An adult aged 56 years or older who has certain medical conditions and previously received 1 or more doses of PPSV23 vaccine should receive 1 dose of PCV13. The PCV13 vaccine dose should be obtained 1 or more years after the last PPSV23 vaccine dose.  Pneumococcal polysaccharide (PPSV23) vaccine. When PCV13 is also indicated, PCV13 should be obtained first. All adults aged 45 years and older should be immunized. An adult younger than age 72 years who has certain medical conditions should be immunized. Any person who resides in a nursing home or long-term care facility should be immunized. An adult smoker should be immunized. People with an immunocompromised condition and certain other conditions should receive both PCV13 and PPSV23 vaccines. People with human immunodeficiency virus (HIV) infection should be immunized as soon as possible after diagnosis. Immunization during chemotherapy or radiation therapy should be avoided. Routine use of PPSV23 vaccine is not recommended for American Indians, [place] Natives, or people younger than 65 years unless there are medical conditions that require PPSV23 vaccine. When indicated, people who have unknown immunization and have no record of immunization should receive PPSV23 vaccine. One-time revaccination 5 years after the first dose of PPSV23 is recommended for people aged 19-64 years who have chronic kidney failure, nephrotic syndrome, asplenia, or immunocompromised conditions. People who received 1-2 doses of PPSV23 before age 108 years should receive another dose of PPSV23 vaccine at age 78 years or later if at least 5 years have passed since the previous dose. Doses of PPSV23 are not needed for people immunized with PPSV23 at or after age 71 years.  Meningococcal vaccine. Adults with asplenia or persistent complement component deficiencies  should receive 2 doses of quadrivalent meningococcal conjugate (MenACWY-D) vaccine. The doses should be obtained at least 2 months apart. Microbiologists working with certain meningococcal bacteria, [employer] recruits, people at risk during an outbreak,  and people who travel to or live in countries with a high rate of meningitis should be immunized. A first-year college student up through age 2 years who is living in a residence hall should receive a dose if she did not receive a dose on or after her 16th birthday. Adults who have certain high-risk conditions should receive one or more doses of vaccine.  Hepatitis A vaccine. Adults who wish to be protected from this disease, have certain high-risk conditions, work with hepatitis A-infected animals, work in hepatitis A research labs, or travel to or work in countries with a high rate of hepatitis A should be immunized. Adults who were previously unvaccinated and who anticipate close contact with an international adoptee during the first 60 days after arrival in the [place] from a country with a high rate of hepatitis A should be immunized.  Hepatitis B vaccine. Adults who wish to be protected from this disease, have certain high-risk conditions, may be exposed to blood or other infectious body fluids, are household contacts or sex partners of hepatitis B positive people, are clients or workers in certain care facilities, or travel to or work in countries with a high rate of hepatitis B should be immunized.  Haemophilus influenzae type b (Hib) vaccine. A previously unvaccinated person with asplenia or sickle cell disease or having a scheduled splenectomy should receive 1 dose of Hib vaccine. Regardless of previous immunization, a recipient of a hematopoietic stem cell transplant should receive a 3-dose series 6-12 months after her successful transplant. Hib vaccine is not recommended for adults with HIV infection. Preventive Services / Frequency Ages 32  to 56 years  Blood pressure check.** / Every 1 to 2 years.  Lipid and cholesterol check.** / Every 5 years beginning at age 55.  Clinical breast exam.** / Every 3 years for women in their 62s and 37s.  BRCA-related cancer risk assessment.** / For women who have family members with a BRCA-related cancer (breast, ovarian, tubal, or peritoneal cancers).  Pap test.** / Every 2 years from ages 59 through 87. Every 3 years starting at age 5 through age 71 or 28 with a history of 3 consecutive normal Pap tests.  HPV screening.** / Every 3 years from ages 22 through ages 17 to 22 with a history of 3 consecutive normal Pap tests.  Hepatitis C blood test.** / For any individual with known risks for hepatitis C.  Skin self-exam. / Monthly.  Influenza vaccine. / Every year.  Tetanus, diphtheria, and acellular pertussis (Tdap, Td) vaccine.** / Consult your health care provider. Pregnant women should receive 1 dose of Tdap vaccine during each pregnancy. 1 dose of Td every 10 years.  Varicella vaccine.** / Consult your health care provider. Pregnant females who do not have evidence of immunity should receive the first dose after pregnancy.  HPV vaccine. / 3 doses over 6 months, if 25 and younger. The vaccine is not recommended for use in pregnant females. However, pregnancy testing is not needed before receiving a dose.  Measles, mumps, rubella (MMR) vaccine.** / You need at least 1 dose of MMR if you were born in 1957 or later. You may also need a 2nd dose. For females of childbearing age, rubella immunity should be determined. If there is no evidence of immunity, females who are not pregnant should be vaccinated. If there is no evidence of immunity, females who are pregnant should delay immunization until after pregnancy.  Pneumococcal 13-valent conjugate (PCV13) vaccine.** / Consult your health  care provider.  Pneumococcal polysaccharide (PPSV23) vaccine.** / 1 to 2 doses if you smoke cigarettes  or if you have certain conditions.  Meningococcal vaccine.** / 1 dose if you are age 17 to 28 years and a Market researcher living in a residence hall, or have one of several medical conditions, you need to get vaccinated against meningococcal disease. You may also need additional booster doses.  Hepatitis A vaccine.** / Consult your health care provider.  Hepatitis B vaccine.** / Consult your health care provider.  Haemophilus influenzae type b (Hib) vaccine.** / Consult your health care provider. Ages 1 to 87 years  Blood pressure check.** / Every 1 to 2 years.  Lipid and cholesterol check.** / Every 5 years beginning at age 70 years.  Lung cancer screening. / Every year if you are aged 31-80 years and have a 30-pack-year history of smoking and currently smoke or have quit within the past 15 years. Yearly screening is stopped once you have quit smoking for at least 15 years or develop a health problem that would prevent you from having lung cancer treatment.  Clinical breast exam.** / Every year after age 16 years.  BRCA-related cancer risk assessment.** / For women who have family members with a BRCA-related cancer (breast, ovarian, tubal, or peritoneal cancers).  Mammogram.** / Every year beginning at age 44 years and continuing for as long as you are in good health. Consult with your health care provider.  Pap test.** / Every 3 years starting at age 33 years through age 62 or 67 years with a history of 3 consecutive normal Pap tests.  HPV screening.** / Every 3 years from ages 45 years through ages 27 to 65 years with a history of 3 consecutive normal Pap tests.  Fecal occult blood test (FOBT) of stool. / Every year beginning at age 81 years and continuing until age 31 years. You may not need to do this test if you get a colonoscopy every 10 years.  Flexible sigmoidoscopy or colonoscopy.** / Every 5 years for a flexible sigmoidoscopy or every 10 years for a colonoscopy  beginning at age 58 years and continuing until age 44 years.  Hepatitis C blood test.** / For all people born from [birth year] through 1966 and any individual with known risks for hepatitis C.  Skin self-exam. / Monthly.  Influenza vaccine. / Every year.  Tetanus, diphtheria, and acellular pertussis (Tdap/Td) vaccine.** / Consult your health care provider. Pregnant women should receive 1 dose of Tdap vaccine during each pregnancy. 1 dose of Td every 10 years.  Varicella vaccine.** / Consult your health care provider. Pregnant females who do not have evidence of immunity should receive the first dose after pregnancy.  Zoster vaccine.** / 1 dose for adults aged 62 years or older.  Measles, mumps, rubella (MMR) vaccine.** / You need at least 1 dose of MMR if you were born in 1958 or later. You may also need a 2nd dose. For females of childbearing age, rubella immunity should be determined. If there is no evidence of immunity, females who are not pregnant should be vaccinated. If there is no evidence of immunity, females who are pregnant should delay immunization until after pregnancy.  Pneumococcal 13-valent conjugate (PCV13) vaccine.** / Consult your health care provider.  Pneumococcal polysaccharide (PPSV23) vaccine.** / 1 to 2 doses if you smoke cigarettes or if you have certain conditions.  Meningococcal vaccine.** / Consult your health care provider.  Hepatitis A vaccine.** / Consult your health care  provider.  Hepatitis B vaccine.** / Consult your health care provider.  Haemophilus influenzae type b (Hib) vaccine.** / Consult your health care provider. Ages 24 years and over  Blood pressure check.** / Every 1 to 2 years.  Lipid and cholesterol check.** / Every 5 years beginning at age 10 years.  Lung cancer screening. / Every year if you are aged 79-80 years and have a 30-pack-year history of smoking and currently smoke or have quit within the past 15 years. Yearly screening is stopped  once you have quit smoking for at least 15 years or develop a health problem that would prevent you from having lung cancer treatment.  Clinical breast exam.** / Every year after age 81 years.  BRCA-related cancer risk assessment.** / For women who have family members with a BRCA-related cancer (breast, ovarian, tubal, or peritoneal cancers).  Mammogram.** / Every year beginning at age 20 years and continuing for as long as you are in good health. Consult with your health care provider.  Pap test.** / Every 3 years starting at age 94 years through age 62 or 77 years with 3 consecutive normal Pap tests. Testing can be stopped between 65 and 70 years with 3 consecutive normal Pap tests and no abnormal Pap or HPV tests in the past 10 years.  HPV screening.** / Every 3 years from ages 43 years through ages 75 or 40 years with a history of 3 consecutive normal Pap tests. Testing can be stopped between 65 and 70 years with 3 consecutive normal Pap tests and no abnormal Pap or HPV tests in the past 10 years.  Fecal occult blood test (FOBT) of stool. / Every year beginning at age 11 years and continuing until age 24 years. You may not need to do this test if you get a colonoscopy every 10 years.  Flexible sigmoidoscopy or colonoscopy.** / Every 5 years for a flexible sigmoidoscopy or every 10 years for a colonoscopy beginning at age 55 years and continuing until age 7 years.  Hepatitis C blood test.** / For all people born from [birth year] through 1966 and any individual with known risks for hepatitis C.  Osteoporosis screening.** / A one-time screening for women ages 54 years and over and women at risk for fractures or osteoporosis.  Skin self-exam. / Monthly.  Influenza vaccine. / Every year.  Tetanus, diphtheria, and acellular pertussis (Tdap/Td) vaccine.** / 1 dose of Td every 10 years.  Varicella vaccine.** / Consult your health care provider.  Zoster vaccine.** / 1 dose for adults aged 62 years  or older.  Pneumococcal 13-valent conjugate (PCV13) vaccine.** / Consult your health care provider.  Pneumococcal polysaccharide (PPSV23) vaccine.** / 1 dose for all adults aged 52 years and older.  Meningococcal vaccine.** / Consult your health care provider.  Hepatitis A vaccine.** / Consult your health care provider.  Hepatitis B vaccine.** / Consult your health care provider.  Haemophilus influenzae type b (Hib) vaccine.** / Consult your health care provider. ** Family history and personal history of risk and conditions may change your health care provider's recommendations. Document Released: 11/01/2001 Document Revised: 01/20/2014 Document Reviewed: 01/31/2011 [facility] Patient Information 2015 [place], [place]. This information is not intended to replace advice given to you by your health care provider. Make sure you discuss any questions you have with your health care provider.

## 2014-12-25 NOTE — Assessment & Plan Note (Signed)
Check Vitamin D level today.  

## 2014-12-28 DIAGNOSIS — R109 Unspecified abdominal pain: Secondary | ICD-10-CM | POA: Insufficient documentation

## 2014-12-28 NOTE — Progress Notes (Signed)
Jackie [name]  [id number] 07/17/1971 12/28/2014      Progress Note-Follow Up  Subjective  Chief Complaint  Chief Complaint  Patient presents with  . Annual Exam    HPI  Patient is a 44 y.o. female in today for routine medical care.  Patient is in today for annual exam. She is doing fairly well physically although she does complain of some persistent but not worsening abdominal and back pain. Emotionally she is struggling still with a poor marriage and unemployment and is trying to decide what to do next. Patient encouraged to maintain heart healthy diet, regular exercise, adequate sleep. Consider daily probiotics. Take medications as prescribed  Past Medical History  Diagnosis Date  . Vitamin D deficiency 2012  . Chicken pox 45 yrs old  . Thyroid dysfunction 07/22/2011    history - resolved, no current problem  . Anemia     during pregnancy  . Fibrocystic breast 07/22/2011  . Pleurisy 08/29/2011    History - resolved  . Dysmenorrhea 10/20/2011  . Vaginitis 07/15/2012  . Endometriosis 12/22/2012  . Hyponatremia 12/22/2012  . Tinea versicolor 03/07/2013  . Preventative health care 03/07/2013  . 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 SVD (spontaneous vaginal delivery)     x 1  . Missed abortion     resolved on it's own - no surgery required.  Marland Kitchen Headache(784.0)     otc med prn  . Anemia, unspecified 04/27/2014  . Hair loss 07/03/2014  . Low back pain 10/26/2014  . Allergy     seasonal   . Miliaria     Past Surgical History  Procedure Laterality Date  . Dental surgery  11/2013  . Laparoscopic supracervical hysterectomy N/A 12/17/2013    Procedure: LAPAROSCOPIC  SUPRACERVICAL HYSTERECTOMY WITH BILATERAL SALPINGECTOMY;  Surgeon: Oliver Pila, MD;  Location: [facility];  Service: Gynecology;  Laterality: N/A;  2hrs OR time    Family History  Problem Relation Age of Onset  . Diabetes Mother     type 2  . Stroke Mother   . Osteoporosis Mother   . Heart  disease Mother     angina  . Heart attack Father     MI in his 19s  . Diabetes Father     type 2  . Obesity Sister   . Diabetes Sister   . Obesity Brother   . Diabetes Brother     type 2  . Aneurysm Maternal Grandmother   . Heart attack Maternal Grandfather   . Pneumonia Paternal Grandfather   . Hypertension Sister     induced  . Other Sister     anemic  . Anemia Sister   . Depression Sister     History   Social History  . Marital Status: Married    Spouse Name: N/A  . Number of Children: 1  . Years of Education: N/A   Occupational History  . Not on file.   Social History Main Topics  . Smoking status: Never Smoker   . Smokeless tobacco: Never Used  . Alcohol Use: Yes     Comment: 1 to 2 glasses 5 times a week. but then the next week maybe nothing  . Drug Use: No  . Sexual Activity:    Partners: Male    Birth Control/ Protection: None   Other Topics Concern  . Not on file   Social History Narrative    Current Outpatient Prescriptions on File Prior to Visit  Medication Sig  Dispense Refill  . B Complex-C (B-COMPLEX WITH VITAMIN C) tablet Take 1 tablet by mouth daily.    . Flaxseed, Linseed, (FLAX SEED OIL PO) Take by mouth.    . lactobacillus acidophilus (BACID) TABS tablet Take 1 tablet by mouth daily.    Marland Kitchen loratadine (CLARITIN) 10 MG tablet Take 10 mg by mouth daily as needed for allergies.    . Magnesium 400 MG CAPS Take 400 mg by mouth daily.    . Multiple Vitamin (MULTIVITAMIN) tablet Take 1 tablet by mouth daily.       No current facility-administered medications on file prior to visit.    Allergies  Allergen Reactions  . Nizoral [Ketoconazole]     Burning and dry skin    Review of Systems  Review of Systems  Constitutional: Negative for fever, chills and malaise/fatigue.  HENT: Negative for congestion, hearing loss and nosebleeds.   Eyes: Negative for discharge.  Respiratory: Negative for cough, sputum production, shortness of breath and  wheezing.   Cardiovascular: Negative for chest pain, palpitations and leg swelling.  Gastrointestinal: Positive for abdominal pain. Negative for heartburn, nausea, vomiting, diarrhea, constipation and blood in stool.  Genitourinary: Negative for dysuria, urgency, frequency and hematuria.  Musculoskeletal: Positive for back pain. Negative for myalgias and falls.  Skin: Negative for rash.  Neurological: Negative for dizziness, tremors, sensory change, focal weakness, loss of consciousness, weakness and headaches.  Endo/Heme/Allergies: Negative for polydipsia. Does not bruise/bleed easily.  Psychiatric/Behavioral: Negative for depression and suicidal ideas. The patient is not nervous/anxious and does not have insomnia.     Objective  BP 110/74 mmHg  Pulse 71  Temp(Src) 98 F (36.7 C) (Oral)  Resp 16  Ht  (1.549 m)  Wt 135 lb (61.236 kg)  BMI 25.52 kg/m2  SpO2 99%  LMP 12/03/2013  Physical Exam  Physical Exam  Constitutional: She is oriented to person, place, and time and well-developed, well-nourished, and in no distress. No distress.  HENT:  Head: Normocephalic and atraumatic.  Right Ear: External ear normal.  Left Ear: External ear normal.  Nose: Nose normal.  Mouth/Throat: Oropharynx is clear and moist. No oropharyngeal exudate.  Eyes: Conjunctivae are normal. Pupils are equal, round, and reactive to light. Right eye exhibits no discharge. Left eye exhibits no discharge. No scleral icterus.  Neck: Normal range of motion. Neck supple. No thyromegaly present.  Cardiovascular: Normal rate, regular rhythm, normal heart sounds and intact distal pulses.   No murmur heard. Pulmonary/Chest: Effort normal and breath sounds normal. No respiratory distress. She has no wheezes. She has no rales.  Abdominal: Soft. Bowel sounds are normal. She exhibits no distension and no mass. There is no tenderness.  Musculoskeletal: Normal range of motion. She exhibits no edema or tenderness.    Lymphadenopathy:    She has no cervical adenopathy.  Neurological: She is alert and oriented to person, place, and time. She has normal reflexes. No cranial nerve deficit. Coordination normal.  Skin: Skin is warm and dry. No rash noted. She is not diaphoretic.  Psychiatric: Mood, memory and affect normal.    Lab Results  Component Value Date   TSH 2.98 12/25/2014   Lab Results  Component Value Date   WBC 7.3 12/25/2014   HGB 14.5 12/25/2014   HCT 42.3 12/25/2014   MCV 92.1 12/25/2014   PLT 277.0 12/25/2014   Lab Results  Component Value Date   CREATININE 0.61 12/25/2014   BUN 13 12/25/2014   NA 134* 12/25/2014  K 4.3 12/25/2014   CL 100 12/25/2014   CO2 28 12/25/2014   Lab Results  Component Value Date   ALT 20 12/25/2014   AST 26 12/25/2014   ALKPHOS 52 12/25/2014   BILITOT 0.5 12/25/2014   Lab Results  Component Value Date   CHOL 224* 12/25/2014   Lab Results  Component Value Date   HDL 90.00 12/25/2014   Lab Results  Component Value Date   LDLCALC 110* 12/25/2014   Lab Results  Component Value Date   TRIG 120.0 12/25/2014   Lab Results  Component Value Date   CHOLHDL 2 12/25/2014     Assessment & Plan  Anemia Increase leafy greens, consider increased lean red meat and using cast iron cookware. Continue to monitor, report any concerns   Abnormal thyroid blood test Check TSH today   Hyponatremia Check CMP today, asymptomatic   Preventative health care Patient encouraged to maintain heart healthy diet, regular exercise, adequate sleep. Consider daily probiotics. Take medications as prescribed. Annual labs are ordered and reviewed.    Vitamin D deficiency Check Vitamin D level today   Abdominal pain Mild, intermittent. Encouraged hi fiber diet with plenty of fluids, probiotics and report worsening symptoms

## 2014-12-28 NOTE — Assessment & Plan Note (Signed)
Mild, intermittent. Encouraged hi fiber diet with plenty of fluids, probiotics and report worsening symptoms

## 2015-02-05 ENCOUNTER — Ambulatory Visit (INDEPENDENT_AMBULATORY_CARE_PROVIDER_SITE_OTHER): Payer: 59 | Admitting: Medical

## 2015-02-05 ENCOUNTER — Ambulatory Visit (HOSPITAL_BASED_OUTPATIENT_CLINIC_OR_DEPARTMENT_OTHER)
Admission: RE | Admit: 2015-02-05 | Discharge: 2015-02-05 | Disposition: A | Discharge status: Home / Self Care | Payer: 59 | Source: Ambulatory Visit | Attending: Medical | Admitting: Medical

## 2015-02-05 ENCOUNTER — Encounter: Payer: Self-pay | Admitting: Medical

## 2015-02-05 VITALS — BP 112/76 | HR 74 | Temp 98.4°F | Ht 61.0 in | Wt 137.0 lb

## 2015-02-05 DIAGNOSIS — M25521 Pain in right elbow: Secondary | ICD-10-CM

## 2015-02-05 DIAGNOSIS — R42 Dizziness and giddiness: Secondary | ICD-10-CM | POA: Insufficient documentation

## 2015-02-05 DIAGNOSIS — M79601 Pain in right arm: Secondary | ICD-10-CM | POA: Insufficient documentation

## 2015-02-05 DIAGNOSIS — M79621 Pain in right upper arm: Secondary | ICD-10-CM | POA: Diagnosis not present

## 2015-02-05 DIAGNOSIS — R5383 Other fatigue: Secondary | ICD-10-CM

## 2015-02-05 DIAGNOSIS — M25529 Pain in unspecified elbow: Secondary | ICD-10-CM | POA: Insufficient documentation

## 2015-02-05 MED ORDER — HYDROXYZINE HCL 10 MG PO TABS: 10.0000 mg | ORAL_TABLET | Freq: Three times a day (TID) | ORAL | Status: DC | PRN

## 2015-02-05 MED ORDER — SULFAMETHOXAZOLE-TRIMETHOPRIM 800-160 MG PO TABS: 1.0000 | ORAL_TABLET | Freq: Two times a day (BID) | ORAL | Status: DC

## 2015-02-05 NOTE — Progress Notes (Signed)
Pre visit review using our clinic review tool, if applicable. No additional management support is needed unless otherwise documented below in the visit note. 

## 2015-02-05 NOTE — Assessment & Plan Note (Addendum)
Some atypical features on exam and history. Infection vs allergic reaction. But some concern for vascular issue. Want to stat rue US tonight.  Will go ahead and prescribe. Bactrim ds for possible infection.  Rx of hydroxyzine for itching.

## 2015-02-05 NOTE — Assessment & Plan Note (Signed)
Profound 2 hours ago and better now. Since you states felt like you were going to pass out I want to labs but our labs is closed. I can put future orders in for tomorrow and if you wake up still feelling poorly come in at 8 am.

## 2015-02-05 NOTE — Progress Notes (Signed)
Subjective:    Patient ID: Jackie [name], female    DOB: [birth year], 44 y.o.   MRN: [id number]  HPI  Pt in states she was had some rt upper arm redness that came on about 3-4 days ago. The area was red that day was red. Area about 1 inch by 2 inches. That day was very tender. Since then redness decreased but she still has some faint tenderness. Last night felt some fatigue. Faint clammy feeling last  Night. This morning felt bad. 2 hours ago almost felt like she about to pass out.(fatigue). Area on arm does itch. No burn or trauma. No insect bites.    Pt has some mild pain over scar region of partial hysterecomy. This pain comes and goes in the past. Over last week pain every day mild dull pain. Surgery was one year ago. Pt has no diarrhea or constipation. She does not take probiotics.     Review of Systems  Constitutional: Positive for chills and fatigue. Negative for fever, diaphoresis and activity change.  HENT: Negative.   Respiratory: Negative for cough, chest tightness and shortness of breath.   Cardiovascular: Negative for chest pain, palpitations and leg swelling.  Gastrointestinal: Negative for nausea, vomiting, abdominal pain, diarrhea and constipation.  Musculoskeletal: Negative for neck pain and neck stiffness.       Rt arm pain.  Skin: Positive for rash.  Neurological: Negative for dizziness, tremors, seizures, syncope, facial asymmetry, speech difficulty, weakness, light-headedness, numbness and headaches.  Hematological: Negative for adenopathy. Does not bruise/bleed easily.  Psychiatric/Behavioral: Negative for behavioral problems, confusion and agitation. The patient is not nervous/anxious.      Past Medical History  Diagnosis Date  . Vitamin D deficiency 2012  . Chicken pox 44 yrs old  . Thyroid dysfunction 07/22/2011    history - resolved, no current problem  . Anemia     during pregnancy  . Fibrocystic breast 07/22/2011  . Pleurisy 08/29/2011    History -  resolved  . Dysmenorrhea 10/20/2011  . Vaginitis 07/15/2012  . Endometriosis 12/22/2012  . Hyponatremia 12/22/2012  . Tinea versicolor 03/07/2013  . Preventative health care 03/07/2013  . 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 SVD (spontaneous vaginal delivery)     x 1  . Missed abortion     resolved on it's own - no surgery required.  Marland Kitchen. Headache(784.0)     otc med prn  . Anemia, unspecified 04/27/2014  . Hair loss 07/03/2014  . Low back pain 10/26/2014  . Allergy     seasonal   . Miliaria     History   Social History  . Marital Status: Married    Spouse Name: N/A  . Number of Children: 1  . Years of Education: N/A   Occupational History  . Not on file.   Social History Main Topics  . Smoking status: Never Smoker   . Smokeless tobacco: Never Used  . Alcohol Use: Yes     Comment: 1 to 2 glasses 5 times a week. but then the next week maybe nothing  . Drug Use: No  . Sexual Activity:    Partners: Male    Birth Control/ Protection: None   Other Topics Concern  . Not on file   Social History Narrative    Past Surgical History  Procedure Laterality Date  . Dental surgery  11/2013  . Laparoscopic supracervical hysterectomy N/A 12/17/2013    Procedure: LAPAROSCOPIC  SUPRACERVICAL HYSTERECTOMY WITH BILATERAL SALPINGECTOMY;  Surgeon: Oliver PilaKathy W Richardson, MD;  Location: [facility];  Service: Gynecology;  Laterality: N/A;  2hrs OR time    Family History  Problem Relation Age of Onset  . Diabetes Mother     type 2  . Stroke Mother   . Osteoporosis Mother   . Heart disease Mother     angina  . Heart attack Father     MI in his 6950s  . Diabetes Father     type 2  . Obesity Sister   . Diabetes Sister   . Obesity Brother   . Diabetes Brother     type 2  . Aneurysm Maternal Grandmother   . Heart attack Maternal Grandfather   . Pneumonia Paternal Grandfather   . Hypertension Sister     induced  . Other Sister     anemic  . Anemia Sister   . Depression Sister      Allergies  Allergen Reactions  . Nizoral [Ketoconazole]     Burning and dry skin    Current Outpatient Prescriptions on File Prior to Visit  Medication Sig Dispense Refill  . B Complex-C (B-COMPLEX WITH VITAMIN C) tablet Take 1 tablet by mouth daily.    . Flaxseed, Linseed, (FLAX SEED OIL PO) Take by mouth.    . lactobacillus acidophilus (BACID) TABS tablet Take 1 tablet by mouth daily.    Marland Kitchen. loratadine (CLARITIN) 10 MG tablet Take 10 mg by mouth daily as needed for allergies.    . Magnesium 400 MG CAPS Take 400 mg by mouth daily.    . Multiple Vitamin (MULTIVITAMIN) tablet Take 1 tablet by mouth daily.       No current facility-administered medications on file prior to visit.    BP 112/76 mmHg  Pulse 74  Temp(Src) 98.4 F (36.9 C) (Oral)  Ht 5\' 1"  (1.549 m)  Wt 137 lb (62.143 kg)  BMI 25.90 kg/m2  SpO2 100%  LMP 12/03/2013       Objective:   Physical Exam  General- No acute distress. Pleasant patient. Neck- Full range of motion, no jvd Lungs- Clear, even and unlabored. Heart- regular rate and rhythm. Neurologic- CNII- XII grossly intact. Skin- mild small pinkish red area proximal to rt antecubital fossa. Mild tenderness. Medial bicep but no redness in this area.  Rt arm- no bruit heard over medial bicep area. No obvious swelling. But pain mid way up her arm.   Abdomen Inspection:-Inspection Normal.  Palpation/Perucssion: Palpation and Percussion of the abdomen reveal- left side lower quadrant Tender(over region of surgical scar), No Rebound tenderness, No rigidity(Guarding) and No Palpable abdominal masses.  Liver:-Normal.  Spleen:- Normal.   Back- no cva tenderness.        Assessment & Plan:

## 2015-02-05 NOTE — Patient Instructions (Addendum)
Pain in joint, upper arm Some atypical features on exam and history. Infection vs allergic reaction. But some concern for vascular issue. Want to stat rue US tonight.  Will go ahead and prescribe. Bactrim ds for possible infection.  Rx of hydroxyzine for itching.   Fatigue Profound 2 hours ago and better now. Since you states felt like you were going to pass out I want to labs but our labs is closed. I can put future orders in for tomorrow and if you wake up still feelling poorly come in at 8 am.      I think left lower abdomen pain is adhesion based on fact pain direct over scar site. If any worsening or chaging pain let us know.  Follow up 7 days or as needed  [phone].

## 2015-02-06 ENCOUNTER — Other Ambulatory Visit (INDEPENDENT_AMBULATORY_CARE_PROVIDER_SITE_OTHER): Payer: 59

## 2015-02-06 DIAGNOSIS — R5383 Other fatigue: Secondary | ICD-10-CM | POA: Diagnosis not present

## 2015-02-06 LAB — COMPREHENSIVE METABOLIC PANEL
ALT: 14 U/L (ref 0–35)
AST: 19 U/L (ref 0–37)
Albumin: 3.9 g/dL (ref 3.5–5.2)
Alkaline Phosphatase: 50 U/L (ref 39–117)
BUN: 16 mg/dL (ref 6–23)
CO2: 27 mEq/L (ref 19–32)
Calcium: 9.3 mg/dL (ref 8.4–10.5)
Chloride: 105 mEq/L (ref 96–112)
Creatinine, Ser: 0.85 mg/dL (ref 0.40–1.20)
GFR: 76.93 mL/min (ref >60.00)
Glucose, Bld: 91 mg/dL (ref 70–99)
Potassium: 4.6 mEq/L (ref 3.5–5.1)
Sodium: 137 mEq/L (ref 135–145)
Total Bilirubin: 0.5 mg/dL (ref 0.2–1.2)
Total Protein: 7.1 g/dL (ref 6.0–8.3)

## 2015-02-06 LAB — CBC WITH DIFFERENTIAL/PLATELET
Basophils Absolute: 0 10*3/uL (ref 0.0–0.1)
Basophils Relative: 0.3 % (ref 0.0–3.0)
Eosinophils Absolute: 0.1 10*3/uL (ref 0.0–0.7)
Eosinophils Relative: 0.8 % (ref 0.0–5.0)
HCT: 41.1 % (ref 36.0–46.0)
Hemoglobin: 14.1 g/dL (ref 12.0–15.0)
Lymphocytes Relative: 32.3 % (ref 12.0–46.0)
Lymphs Abs: 2.4 10*3/uL (ref 0.7–4.0)
MCHC: 34.4 g/dL (ref 30.0–36.0)
MCV: 91.3 fl (ref 78.0–100.0)
Monocytes Absolute: 0.4 10*3/uL (ref 0.1–1.0)
Monocytes Relative: 4.8 % (ref 3.0–12.0)
Neutro Abs: 4.5 10*3/uL (ref 1.4–7.7)
Neutrophils Relative %: 61.8 % (ref 43.0–77.0)
Platelets: 241 10*3/uL (ref 150.0–400.0)
RBC: 4.5 Mil/uL (ref 3.87–5.11)
RDW: 12.6 % (ref 11.5–15.5)
WBC: 7.3 10*3/uL (ref 4.0–10.5)

## 2015-06-29 ENCOUNTER — Ambulatory Visit: Payer: 59 | Admitting: Family

## 2015-06-29 ENCOUNTER — Telehealth: Payer: Self-pay | Admitting: Family Medicine

## 2015-06-29 NOTE — Telephone Encounter (Signed)
Relation to ZO:XWRU Call back number: [phone]   Reason for call:  Patient called and left voicemail at 7:30am cancelling appointment with Dr. Abner Greenspan for today, patient would like $50 no show fee waived due to her not being able to call yesterday due to the weekend. Charge or no charge

## 2015-06-29 NOTE — Telephone Encounter (Signed)
Jackie [name] patient would like a follow up call advising if she will be charge or not. Please advise

## 2015-06-29 NOTE — Telephone Encounter (Signed)
charge 

## 2015-06-29 NOTE — Telephone Encounter (Signed)
No charge. 

## 2015-07-27 ENCOUNTER — Ambulatory Visit (INDEPENDENT_AMBULATORY_CARE_PROVIDER_SITE_OTHER): Payer: 59 | Admitting: Family Medicine

## 2015-07-27 ENCOUNTER — Encounter: Payer: Self-pay | Admitting: Family Medicine

## 2015-07-27 VITALS — BP 117/76 | HR 83 | Temp 98.3°F | Ht 62.0 in | Wt 135.0 lb

## 2015-07-27 DIAGNOSIS — R591 Generalized enlarged lymph nodes: Secondary | ICD-10-CM

## 2015-07-27 DIAGNOSIS — L309 Dermatitis, unspecified: Secondary | ICD-10-CM | POA: Diagnosis not present

## 2015-07-27 DIAGNOSIS — R21 Rash and other nonspecific skin eruption: Secondary | ICD-10-CM | POA: Diagnosis not present

## 2015-07-27 DIAGNOSIS — I889 Nonspecific lymphadenitis, unspecified: Secondary | ICD-10-CM | POA: Diagnosis not present

## 2015-07-27 MED ORDER — AMOXICILLIN 500 MG PO TABS
500.0000 mg | ORAL_TABLET | Freq: Three times a day (TID) | ORAL | Status: DC
Start: 2015-07-27 — End: 2015-10-02

## 2015-07-27 NOTE — Patient Instructions (Signed)

## 2015-07-27 NOTE — Progress Notes (Signed)
Pre visit review using our clinic review tool, if applicable. No additional management support is needed unless otherwise documented below in the visit note. 

## 2015-07-28 ENCOUNTER — Encounter: Payer: Self-pay | Admitting: Family Medicine

## 2015-08-02 ENCOUNTER — Encounter: Payer: Self-pay | Admitting: Family Medicine

## 2015-08-02 DIAGNOSIS — R591 Generalized enlarged lymph nodes: Secondary | ICD-10-CM

## 2015-08-02 HISTORY — DX: Generalized enlarged lymph nodes: R59.1

## 2015-08-02 NOTE — Progress Notes (Signed)
Subjective:    Patient ID: Jackie [name], female    DOB: May 04, 1971, 44 y.o.   MRN: [id number]  Chief Complaint  Patient presents with  . Eye Drainage  . Fever  . Fatigue    HPI Patient is in today for evaluation of respiratory symptoms. She has been struggling with fatigue and malaise. She has had some conjunctivitis with matted itchy eyes at times. She's complaining of ear pain and head congestion. She has been taking DayQuil and NyQuil for her chest tightness and congestion but unfortunately this gave her palpitations so she stopped. She also notes a rash on her back status post an insect bite in August. It is mildly itchy. Denies CP/palp/SOB/HA/GI or GU c/o. Taking meds as prescribed  Past Medical History  Diagnosis Date  . Vitamin D deficiency 2012  . Chicken pox 44 yrs old  . Thyroid dysfunction 07/22/2011    history - resolved, no current problem  . Anemia     during pregnancy  . Fibrocystic breast 07/22/2011  . Pleurisy 08/29/2011    History - resolved  . Dysmenorrhea 10/20/2011  . Vaginitis 07/15/2012  . Endometriosis 12/22/2012  . Hyponatremia 12/22/2012  . Tinea versicolor 03/07/2013  . Preventative health care 03/07/2013  . 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 SVD (spontaneous vaginal delivery)     x 1  . Missed abortion     resolved on it's own - no surgery required.  Marland Kitchen Headache(784.0)     otc med prn  . Anemia, unspecified 04/27/2014  . Hair loss 07/03/2014  . Low back pain 10/26/2014  . Allergy     seasonal   . Miliaria   . Lymphadenopathy 08/02/2015    Past Surgical History  Procedure Laterality Date  . Dental surgery  11/2013  . Laparoscopic supracervical hysterectomy N/A 12/17/2013    Procedure: LAPAROSCOPIC  SUPRACERVICAL HYSTERECTOMY WITH BILATERAL SALPINGECTOMY;  Surgeon: Oliver Pila, MD;  Location: [facility];  Service: Gynecology;  Laterality: N/A;  2hrs OR time    Family History  Problem Relation Age of Onset  . Diabetes Mother       type 2  . Stroke Mother   . Osteoporosis Mother   . Heart disease Mother     angina  . Heart attack Father     MI in his 41s  . Diabetes Father     type 2  . Obesity Sister   . Diabetes Sister   . Obesity Brother   . Diabetes Brother     type 2  . Aneurysm Maternal Grandmother   . Heart attack Maternal Grandfather   . Pneumonia Paternal Grandfather   . Hypertension Sister     induced  . Other Sister     anemic  . Anemia Sister   . Depression Sister     Social History   Social History  . Marital Status: Married    Spouse Name: N/A  . Number of Children: 1  . Years of Education: N/A   Occupational History  . Not on file.   Social History Main Topics  . Smoking status: Never Smoker   . Smokeless tobacco: Never Used  . Alcohol Use: Yes     Comment: 1 to 2 glasses 5 times a week. but then the next week maybe nothing  . Drug Use: No  . Sexual Activity:    Partners: Male    Birth Control/ Protection: None   Other Topics Concern  . Not on  file   Social History Narrative    Outpatient Prescriptions Prior to Visit  Medication Sig Dispense Refill  . B Complex-C (B-COMPLEX WITH VITAMIN C) tablet Take 1 tablet by mouth daily.    . Flaxseed, Linseed, (FLAX SEED OIL PO) Take by mouth.    . hydrOXYzine (ATARAX/VISTARIL) 10 MG tablet Take 1 tablet (10 mg total) by mouth 3 (three) times daily as needed for itching. 30 tablet 0  . lactobacillus acidophilus (BACID) TABS tablet Take 1 tablet by mouth daily.    Marland Kitchen loratadine (CLARITIN) 10 MG tablet Take 10 mg by mouth daily as needed for allergies.    . Magnesium 400 MG CAPS Take 400 mg by mouth daily.    . Multiple Vitamin (MULTIVITAMIN) tablet Take 1 tablet by mouth daily.      Marland Kitchen sulfamethoxazole-trimethoprim (BACTRIM DS,SEPTRA DS) 800-160 MG per tablet Take 1 tablet by mouth 2 (two) times daily. 14 tablet 0   No facility-administered medications prior to visit.    Allergies  Allergen Reactions  . Nizoral  [Ketoconazole]     Burning and dry skin    Review of Systems  Constitutional: Positive for malaise/fatigue. Negative for fever.  HENT: Positive for congestion, ear pain and sore throat.   Eyes: Positive for discharge.  Respiratory: Negative for shortness of breath.   Cardiovascular: Positive for palpitations. Negative for chest pain and leg swelling.  Gastrointestinal: Negative for nausea and abdominal pain.  Genitourinary: Negative for dysuria.  Musculoskeletal: Negative for falls.  Skin: Negative for rash.  Neurological: Negative for loss of consciousness and headaches.  Endo/Heme/Allergies: Negative for environmental allergies.  Psychiatric/Behavioral: Negative for depression. The patient is not nervous/anxious.        Objective:    Physical Exam  Constitutional: She is oriented to person, place, and time. She appears well-developed and well-nourished. No distress.  HENT:  Head: Normocephalic and atraumatic.  Nose: Nose normal.  TMs erythematous  Eyes: Right eye exhibits no discharge. Left eye exhibits no discharge.  Neck: Normal range of motion. Neck supple.  Cardiovascular: Normal rate and regular rhythm.   No murmur heard. Pulmonary/Chest: Effort normal and breath sounds normal.  Abdominal: Soft. Bowel sounds are normal. There is no tenderness.  Musculoskeletal: She exhibits no edema.  Lymphadenopathy:    She has cervical adenopathy.  Neurological: She is alert and oriented to person, place, and time.  Skin: Skin is warm and dry. Rash noted.  Psychiatric: She has a normal mood and affect.  Nursing note and vitals reviewed.   BP 117/76 mmHg  Pulse 83  Temp(Src) 98.3 F (36.8 C) (Oral)  Ht  (1.575 m)  Wt 135 lb (61.236 kg)  BMI 24.69 kg/m2  SpO2 100%  LMP 12/03/2013 Wt Readings from Last 3 Encounters:  07/27/15 135 lb (61.236 kg)  02/05/15 137 lb (62.143 kg)  12/25/14 135 lb (61.236 kg)     Lab Results  Component Value Date   WBC 7.3 02/06/2015    HGB 14.1 02/06/2015   HCT 41.1 02/06/2015   PLT 241.0 02/06/2015   GLUCOSE 91 02/06/2015   CHOL 224* 12/25/2014   TRIG 120.0 12/25/2014   HDL 90.00 12/25/2014   LDLDIRECT 119.4 08/16/2011   LDLCALC 110* 12/25/2014   ALT 14 02/06/2015   AST 19 02/06/2015   NA 137 02/06/2015   K 4.6 02/06/2015   CL 105 02/06/2015   CREATININE 0.85 02/06/2015   BUN 16 02/06/2015   CO2 27 02/06/2015   TSH 2.98 12/25/2014  INR 0.91 12/22/2011    Lab Results  Component Value Date   TSH 2.98 12/25/2014   Lab Results  Component Value Date   WBC 7.3 02/06/2015   HGB 14.1 02/06/2015   HCT 41.1 02/06/2015   MCV 91.3 02/06/2015   PLT 241.0 02/06/2015   Lab Results  Component Value Date   NA 137 02/06/2015   K 4.6 02/06/2015   CO2 27 02/06/2015   GLUCOSE 91 02/06/2015   BUN 16 02/06/2015   CREATININE 0.85 02/06/2015   BILITOT 0.5 02/06/2015   ALKPHOS 50 02/06/2015   AST 19 02/06/2015   ALT 14 02/06/2015   PROT 7.1 02/06/2015   ALBUMIN 3.9 02/06/2015   CALCIUM 9.3 02/06/2015   GFR 76.93 02/06/2015   Lab Results  Component Value Date   CHOL 224* 12/25/2014   Lab Results  Component Value Date   HDL 90.00 12/25/2014   Lab Results  Component Value Date   LDLCALC 110* 12/25/2014   Lab Results  Component Value Date   TRIG 120.0 12/25/2014   Lab Results  Component Value Date   CHOLHDL 2 12/25/2014   No results found for: HGBA1C     Assessment & Plan:   Problem List Items Addressed This Visit    Eczema    Claritin daily and probiotics. Maintain adequate hydration. Referred to dermatology for further treatment      Lymphadenopathy    With otitis media, started on Amoxicillin, probiotics, increase rest and hydration.       Other Visit Diagnoses    Rash and nonspecific skin eruption    -  Primary    Relevant Orders    Ambulatory referral to Dermatology    Lymphadenitis           I have discontinued Ms. Donaghue's sulfamethoxazole-trimethoprim. I am also having her  start on amoxicillin. Additionally, I am having her maintain her multivitamin, lactobacillus acidophilus, (Flaxseed, Linseed, (FLAX SEED OIL PO)), B-complex with vitamin C, loratadine, Magnesium, and hydrOXYzine.  Meds ordered this encounter  Medications  . amoxicillin (AMOXIL) 500 MG tablet    Sig: Take 1 tablet (500 mg total) by mouth 3 (three) times daily.    Dispense:  30 tablet    Refill:  0     Danise EdgeBLYTH, STACEY, MD

## 2015-08-02 NOTE — Assessment & Plan Note (Addendum)
Claritin daily and probiotics. Maintain adequate hydration. Referred to dermatology for further treatment

## 2015-08-02 NOTE — Assessment & Plan Note (Signed)
With otitis media, started on Amoxicillin, probiotics, increase rest and hydration.

## 2015-10-02 ENCOUNTER — Ambulatory Visit: Payer: 59 | Admitting: Family Medicine

## 2015-10-02 ENCOUNTER — Ambulatory Visit (INDEPENDENT_AMBULATORY_CARE_PROVIDER_SITE_OTHER): Payer: 59 | Admitting: Physician Assistant

## 2015-10-02 ENCOUNTER — Encounter: Payer: Self-pay | Admitting: Physician Assistant

## 2015-10-02 VITALS — BP 116/69 | HR 78 | Temp 98.1°F | Ht 62.0 in | Wt 138.0 lb

## 2015-10-02 DIAGNOSIS — B9689 Other specified bacterial agents as the cause of diseases classified elsewhere: Secondary | ICD-10-CM

## 2015-10-02 DIAGNOSIS — J019 Acute sinusitis, unspecified: Secondary | ICD-10-CM

## 2015-10-02 MED ORDER — AMOXICILLIN-POT CLAVULANATE 875-125 MG PO TABS: 1.0000 | ORAL_TABLET | Freq: Two times a day (BID) | ORAL | Status: DC

## 2015-10-02 NOTE — Progress Notes (Signed)
Patient presents to clinic today a couple of weeks of chest congestion with cough productive of thick yellow sputum. Endorses sinus pressure and bilateral ear pain and pressure. Endorses some facial pain and headaches. Endorses some intermittent subjective fevers. Denies chest pain. Has taken multiple OTC regimens with only some relief in symptoms.  Past Medical History  Diagnosis Date  . Vitamin D deficiency 2012  . Chicken pox 45 yrs old  . Thyroid dysfunction 07/22/2011    history - resolved, no current problem  . Anemia     during pregnancy  . Fibrocystic breast 07/22/2011  . Pleurisy 08/29/2011    History - resolved  . Dysmenorrhea 10/20/2011  . Vaginitis 07/15/2012  . Endometriosis 12/22/2012  . Hyponatremia 12/22/2012  . Tinea versicolor 03/07/2013  . Preventative health care 03/07/2013  . 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 SVD (spontaneous vaginal delivery)     x 1  . Missed abortion     resolved on it's own - no surgery required.  Marland Kitchen Headache(784.0)     otc med prn  . Anemia, unspecified 04/27/2014  . Hair loss 07/03/2014  . Low back pain 10/26/2014  . Allergy     seasonal   . Miliaria   . Lymphadenopathy 08/02/2015    Current Outpatient Prescriptions on File Prior to Visit  Medication Sig Dispense Refill  . B Complex-C (B-COMPLEX WITH VITAMIN C) tablet Take 1 tablet by mouth daily.    . Flaxseed, Linseed, (FLAX SEED OIL PO) Take by mouth.    . lactobacillus acidophilus (BACID) TABS tablet Take 1 tablet by mouth daily.    Marland Kitchen loratadine (CLARITIN) 10 MG tablet Take 10 mg by mouth daily as needed for allergies.    . Magnesium 400 MG CAPS Take 400 mg by mouth daily.    . Multiple Vitamin (MULTIVITAMIN) tablet Take 1 tablet by mouth daily.      . hydrOXYzine (ATARAX/VISTARIL) 10 MG tablet Take 1 tablet (10 mg total) by mouth 3 (three) times daily as needed for itching. (Patient not taking: Reported on 10/02/2015) 30 tablet 0   No current facility-administered  medications on file prior to visit.    Allergies  Allergen Reactions  . Nizoral [Ketoconazole]     Burning and dry skin    Family History  Problem Relation Age of Onset  . Diabetes Mother     type 2  . Stroke Mother   . Osteoporosis Mother   . Heart disease Mother     angina  . Heart attack Father     MI in his 46s  . Diabetes Father     type 2  . Obesity Sister   . Diabetes Sister   . Obesity Brother   . Diabetes Brother     type 2  . Aneurysm Maternal Grandmother   . Heart attack Maternal Grandfather   . Pneumonia Paternal Grandfather   . Hypertension Sister     induced  . Other Sister     anemic  . Anemia Sister   . Depression Sister     Social History   Social History  . Marital Status: Married    Spouse Name: N/A  . Number of Children: 1  . Years of Education: N/A   Social History Main Topics  . Smoking status: Never Smoker   . Smokeless tobacco: Never Used  . Alcohol Use: Yes     Comment: 1 to 2 glasses 5 times a week. but then the  next week maybe nothing  . Drug Use: No  . Sexual Activity:    Partners: Male    Birth Control/ Protection: None   Other Topics Concern  . None   Social History Narrative   Review of Systems - See HPI.  All other ROS are negative.  BP 116/69 mmHg  Pulse 78  Temp(Src) 98.1 F (36.7 C) (Oral)  Ht 5\' 2"  (1.575 m)  Wt 138 lb (62.596 kg)  BMI 25.23 kg/m2  SpO2 99%  LMP 12/03/2013  Physical Exam  Constitutional: She is oriented to person, place, and time and well-developed, well-nourished, and in no distress.  HENT:  Head: Normocephalic and atraumatic.  Right Ear: Tympanic membrane normal.  Left Ear: Tympanic membrane normal.  Nose: Right sinus exhibits frontal sinus tenderness. Left sinus exhibits frontal sinus tenderness.  Mouth/Throat: Uvula is midline, oropharynx is clear and moist and mucous membranes are normal.  Cardiovascular: Normal rate, regular rhythm, normal heart sounds and intact distal pulses.     Pulmonary/Chest: Effort normal and breath sounds normal.  Neurological: She is alert and oriented to person, place, and time.  Skin: Skin is warm and dry. No rash noted.  Psychiatric: Affect normal.  Vitals reviewed.   No results found for this or any previous visit (from the past 2160 hour(s)).  Assessment/Plan: Acute bacterial sinusitis Rx Augmentin.  Increase fluids.  Rest.  Saline nasal spray.  Probiotic.  Mucinex as directed.  Humidifier in bedroom. Restart Flonase and Claritin daily.  Call or return to clinic if symptoms are not improving.

## 2015-10-02 NOTE — Patient Instructions (Signed)
Please take antibiotic as directed.  Increase fluid intake.  Use Saline nasal spray.  Take a daily multivitamin. Take Mucinex-DM as directed.  Restart your Flonase taking as directed.  Place a humidifier in the bedroom.  Please call or return clinic if symptoms are not improving.  Sinusitis Sinusitis is redness, soreness, and swelling (inflammation) of the paranasal sinuses. Paranasal sinuses are air pockets within the bones of your face (beneath the eyes, the middle of the forehead, or above the eyes). In healthy paranasal sinuses, mucus is able to drain out, and air is able to circulate through them by way of your nose. However, when your paranasal sinuses are inflamed, mucus and air can become trapped. This can allow bacteria and other germs to grow and cause infection. Sinusitis can develop quickly and last only a short time (acute) or continue over a long period (chronic). Sinusitis that lasts for more than 12 weeks is considered chronic.  CAUSES  Causes of sinusitis include:  Allergies.  Structural abnormalities, such as displacement of the cartilage that separates your nostrils (deviated septum), which can decrease the air flow through your nose and sinuses and affect sinus drainage.  Functional abnormalities, such as when the small hairs (cilia) that line your sinuses and help remove mucus do not work properly or are not present. SYMPTOMS  Symptoms of acute and chronic sinusitis are the same. The primary symptoms are pain and pressure around the affected sinuses. Other symptoms include:  Upper toothache.  Earache.  Headache.  Bad breath.  Decreased sense of smell and taste.  A cough, which worsens when you are lying flat.  Fatigue.  Fever.  Thick drainage from your nose, which often is green and may contain pus (purulent).  Swelling and warmth over the affected sinuses. DIAGNOSIS  Your caregiver will perform a physical exam. During the exam, your caregiver may:  Look in  your nose for signs of abnormal growths in your nostrils (nasal polyps).  Tap over the affected sinus to check for signs of infection.  View the inside of your sinuses (endoscopy) with a special imaging device with a light attached (endoscope), which is inserted into your sinuses. If your caregiver suspects that you have chronic sinusitis, one or more of the following tests may be recommended:  Allergy tests.  Nasal culture A sample of mucus is taken from your nose and sent to a lab and screened for bacteria.  Nasal cytology A sample of mucus is taken from your nose and examined by your caregiver to determine if your sinusitis is related to an allergy. TREATMENT  Most cases of acute sinusitis are related to a viral infection and will resolve on their own within 10 days. Sometimes medicines are prescribed to help relieve symptoms (pain medicine, decongestants, nasal steroid sprays, or saline sprays).  However, for sinusitis related to a bacterial infection, your caregiver will prescribe antibiotic medicines. These are medicines that will help kill the bacteria causing the infection.  Rarely, sinusitis is caused by a fungal infection. In theses cases, your caregiver will prescribe antifungal medicine. For some cases of chronic sinusitis, surgery is needed. Generally, these are cases in which sinusitis recurs more than 3 times per year, despite other treatments. HOME CARE INSTRUCTIONS   Drink plenty of water. Water helps thin the mucus so your sinuses can drain more easily.  Use a humidifier.  Inhale steam 3 to 4 times a day (for example, sit in the bathroom with the shower running).  Apply a warm,  moist washcloth to your face 3 to 4 times a day, or as directed by your caregiver.  Use saline nasal sprays to help moisten and clean your sinuses.  Take over-the-counter or prescription medicines for pain, discomfort, or fever only as directed by your caregiver. SEEK IMMEDIATE MEDICAL CARE  IF:  You have increasing pain or severe headaches.  You have nausea, vomiting, or drowsiness.  You have swelling around your face.  You have vision problems.  You have a stiff neck.  You have difficulty breathing. MAKE SURE YOU:   Understand these instructions.  Will watch your condition.  Will get help right away if you are not doing well or get worse. Document Released: 09/05/2005 Document Revised: 11/28/2011 Document Reviewed: 09/20/2011 [facility] Patient Information 2014 [place], [place].

## 2015-10-02 NOTE — Assessment & Plan Note (Signed)
Rx Augmentin.  Increase fluids.  Rest.  Saline nasal spray.  Probiotic.  Mucinex as directed.  Humidifier in bedroom. Restart Flonase and Claritin daily.  Call or return to clinic if symptoms are not improving.

## 2015-10-02 NOTE — Progress Notes (Signed)
Pre visit review using our clinic review tool, if applicable. No additional management support is needed unless otherwise documented below in the visit note. 

## 2015-12-09 ENCOUNTER — Telehealth: Payer: Self-pay | Admitting: Family Medicine

## 2015-12-09 NOTE — Telephone Encounter (Signed)
°  [facility] - Client TELEPHONE ADVICE RECORD   [facility]     Patient Name: Jackie [name] Initial Comment Caller states that they were told to hold and speak to a nurse but hung up wants to know if she can continue taking an over the counter medication until she goes to doctor. Having burning with urination and medicine was giving her relief. Didn't take it today because it dyes her urine and wondering if that will be a problem for her doctor.  DOB: 1971-05-13      Nurse Assessment  Nurse: Tera Materowan, RN, Elnita Maxwellheryl Date/Time (Eastern Time): 12/09/2015 4:44:36 PM  Confirm and document reason for call. If symptomatic, describe symptoms. You must click the next button to save text entered. ---Caller states that she has an appointment to see her pcp in the am but that she has been having pain and frequency with urination for the last 4 days. States that she has been taking azo and taking increased fluids and her s/s improved for a couple of days but have returned today. States that she has sediment and blood in her urine today. The office scheduled her an appointment for 1130 am in the morning.  Has the patient traveled out of the country within the last 30 days? ---Not Applicable  Does the patient have any new or worsening symptoms? ---Yes  Will a triage be completed? ---Yes  Related visit to physician within the last 2 weeks? ---No  Does the PT have any chronic conditions? (i.e. diabetes, asthma, etc.) ---No  Is the patient pregnant or possibly pregnant? (Ask all females between the ages of 2812-55) ---No  Is this a behavioral health or substance abuse call? ---No    Guidelines     Guideline Title Affirmed Question Affirmed Notes   Pregnancy - Urination Pain Blood in urine (red, pink, or tea-colored)    Final Disposition User   See Physician within 4 Hours (or PCP triage) Tera Materowan, RN, Cheryl     Referrals   [facility]    Disagree/Comply: Comply

## 2015-12-09 NOTE — Telephone Encounter (Signed)
Appt noted.  Pt should keep appt as scheduled.

## 2015-12-10 ENCOUNTER — Encounter: Payer: Self-pay | Admitting: Family Medicine

## 2015-12-10 ENCOUNTER — Ambulatory Visit (INDEPENDENT_AMBULATORY_CARE_PROVIDER_SITE_OTHER): Payer: 59 | Admitting: Family Medicine

## 2015-12-10 VITALS — BP 118/78 | HR 75 | Temp 97.7°F | Ht 62.0 in | Wt 138.0 lb

## 2015-12-10 DIAGNOSIS — N3001 Acute cystitis with hematuria: Secondary | ICD-10-CM

## 2015-12-10 LAB — POCT URINALYSIS DIP (MANUAL ENTRY)
Bilirubin, UA: NEGATIVE
Glucose, UA: NEGATIVE
Ketones, POC UA: NEGATIVE
Nitrite, UA: NEGATIVE
Protein Ur, POC: NEGATIVE
Spec Grav, UA: 1.02
Urobilinogen, UA: 4
pH, UA: 6

## 2015-12-10 MED ORDER — NITROFURANTOIN MONOHYD MACRO 100 MG PO CAPS: 100.0000 mg | ORAL_CAPSULE | Freq: Two times a day (BID) | ORAL | Status: DC

## 2015-12-10 NOTE — Progress Notes (Signed)
[facility] [address], [address] [place], [place] [address] [phone] [phone]  Date:  12/10/2015   Name:  Jackie FlackKausar Consoli   DOB:  April 28, 1971   MRN:  [id number]  PCP:  Danise EdgeBLYTH, STACEY, MD    Chief Complaint: Urinary Tract Infection   History of Present Illness:  Jackie FlackKausar Tonkinson is a 45 y.o. very pleasant female patient who presents with the following:  Generally healthy woman here today with concern for UTI S/p hysterectomy.   She noted sx of possible UTI on Monday- today is Thursday. She is recently back from an 8 week trip to [place] to visit her parents and then reunited with her husband so they did have more intercourse than usual.  She has noted cloudy urine, burning, and slight bleeding with urination.  She started on azo and drank plenty of fluids which has helped with her sx  She has noted a mild back ache but nothing severe,  She feels a bit tired.  She has not noted any belly pain. No fever noted.  No vomiting.   She did not take any azo yesterday to see if the sx were still present and they were   She has not had a UTI in a long time  Patient Active Problem List   Diagnosis Date Noted  . Lymphadenopathy 08/02/2015  . Pain in joint, upper arm 02/05/2015  . AP (abdominal pain) 12/28/2014  . Back pain 12/03/2014  . Low back pain 10/26/2014  . Acute bacterial sinusitis 08/04/2014  . Hair loss 07/03/2014  . Abdominal pain 04/27/2014  . Anemia 04/27/2014  . S/P laparoscopic supracervical hysterectomy 12/17/2013  . Abnormal thyroid blood test 08/20/2013  . 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 Mouth ulcer 08/06/2013  . UTI (lower urinary tract infection) 05/22/2013  . Allergic rhinitis 05/10/2013  . Tinea versicolor 03/07/2013  . Preventative health care 03/07/2013  . Endometriosis 12/22/2012  . Hyponatremia 12/22/2012  . Vaginitis 07/15/2012  . Eczema 12/22/2011  . Left ankle swelling 12/22/2011  .  Anxiety disorder 12/22/2011  . Ovarian cyst 10/20/2011  . Dysmenorrhea 10/20/2011  . Pleurisy 08/29/2011  . Chest pain, atypical 08/24/2011  . Epistaxis 08/24/2011  . Palpitations 08/05/2011  . Breast pain 08/03/2011  . Neck pain, musculoskeletal 08/03/2011  . Ear pain 07/26/2011  . Contraceptive management 07/26/2011  . Vitamin D deficiency 07/22/2011  . Fatigue 07/22/2011  . Thyroid dysfunction 07/22/2011  . Hyperkalemia 07/22/2011  . Dysphagia 07/22/2011  . Fibrocystic breast 07/22/2011    Past Medical History  Diagnosis Date  . Vitamin D deficiency 2012  . Chicken pox 45 yrs old  . Thyroid dysfunction 07/22/2011    history - resolved, no current problem  . Anemia     during pregnancy  . Fibrocystic breast 07/22/2011  . Pleurisy 08/29/2011    History - resolved  . Dysmenorrhea 10/20/2011  . Vaginitis 07/15/2012  . Endometriosis 12/22/2012  . Hyponatremia 12/22/2012  . Tinea versicolor 03/07/2013  . Preventative health care 03/07/2013  . 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 SVD (spontaneous vaginal delivery)     x 1  . Missed abortion     resolved on it's own - no surgery required.  Marland Kitchen. Headache(784.0)     otc med prn  . Anemia, unspecified 04/27/2014  . Hair loss 07/03/2014  . Low back pain 10/26/2014  . Allergy     seasonal   . Miliaria   .  Lymphadenopathy 08/02/2015    Past Surgical History  Procedure Laterality Date  . Dental surgery  11/2013  . Laparoscopic supracervical hysterectomy N/A 12/17/2013    Procedure: LAPAROSCOPIC  SUPRACERVICAL HYSTERECTOMY WITH BILATERAL SALPINGECTOMY;  Surgeon: Oliver Pila, MD;  Location: [facility];  Service: Gynecology;  Laterality: N/A;  2hrs OR time    Social History  Substance Use Topics  . Smoking status: Never Smoker   . Smokeless tobacco: Never Used  . Alcohol Use: Yes     Comment: 1 to 2 glasses 5 times a week. but then the next week maybe nothing    Family History  Problem Relation Age of Onset  .  Diabetes Mother     type 2  . Stroke Mother   . Osteoporosis Mother   . Heart disease Mother     angina  . Heart attack Father     MI in his 23s  . Diabetes Father     type 2  . Obesity Sister   . Diabetes Sister   . Obesity Brother   . Diabetes Brother     type 2  . Aneurysm Maternal Grandmother   . Heart attack Maternal Grandfather   . Pneumonia Paternal Grandfather   . Hypertension Sister     induced  . Other Sister     anemic  . Anemia Sister   . Depression Sister     Allergies  Allergen Reactions  . Nizoral [Ketoconazole]     Burning and dry skin    Medication list has been reviewed and updated.  Current Outpatient Prescriptions on File Prior to Visit  Medication Sig Dispense Refill  . B Complex-C (B-COMPLEX WITH VITAMIN C) tablet Take 1 tablet by mouth daily.    . Flaxseed, Linseed, (FLAX SEED OIL PO) Take by mouth.    . hydrOXYzine (ATARAX/VISTARIL) 10 MG tablet Take 1 tablet (10 mg total) by mouth 3 (three) times daily as needed for itching. 30 tablet 0  . lactobacillus acidophilus (BACID) TABS tablet Take 1 tablet by mouth daily.    Marland Kitchen loratadine (CLARITIN) 10 MG tablet Take 10 mg by mouth daily as needed for allergies.    . Magnesium 400 MG CAPS Take 400 mg by mouth daily.    . Multiple Vitamin (MULTIVITAMIN) tablet Take 1 tablet by mouth daily.      Marland Kitchen triamcinolone ointment (KENALOG) 0.1 % APPLY TO AFFECTED AREA TWICE A DAY FOR 2 WEEKS.  0   No current facility-administered medications on file prior to visit.    Review of Systems:  As per HPI- otherwise negative.   Physical Examination: Filed Vitals:   12/10/15 1149  BP: 118/78  Pulse: 75  Temp: 97.7 F (36.5 C)   Filed Vitals:   12/10/15 1149  Height:  (1.575 m)  Weight: 138 lb (62.596 kg)   Body mass index is 25.23 kg/(m^2). Ideal Body Weight: Weight in (lb) to have BMI = 25: 136.4  GEN: WDWN, NAD, Non-toxic, A & O x 3, looks well HEENT: Atraumatic, Normocephalic. Neck supple. No  masses, No LAD. Ears and Nose: No external deformity. CV: RRR, No M/G/R. No JVD. No thrill. No extra heart sounds. PULM: CTA B, no wheezes, crackles, rhonchi. No retractions. No resp. distress. No accessory muscle use. ABD: S, NT, ND, +BS. No rebound. No HSM.  No CVA tenderness EXTR: No c/c/e NEURO Normal gait.  PSYCH: Normally interactive. Conversant. Not depressed or anxious appearing.  Calm demeanor.    UA consistent  with UTI Await culture  Assessment and Plan: Acute cystitis with hematuria - Plan: Urine culture, nitrofurantoin, macrocrystal-monohydrate, (MACROBID) 100 MG capsule  Start on macrobid for UTI while we await culture See patient instructions for more details.   Reassured that her sx are likely caused by intercourse as she was concerned about some sort of vitamin deficiency   Signed Abbe Amsterdam, MD

## 2015-12-10 NOTE — Addendum Note (Signed)
Addended by: Doree BarthelLOWE, Keyler Hoge on: 12/10/2015 01:08 PM   Modules accepted: Orders

## 2015-12-10 NOTE — Patient Instructions (Addendum)
It does seem like you have a UTI- I will check your culture and will be back in touch with you.  For the time being use the macrobid as directed and let me know if you do not feel better in the next couple of days- Sooner if worse.

## 2015-12-12 LAB — URINE CULTURE: Colony Count: 85000

## 2015-12-14 ENCOUNTER — Other Ambulatory Visit: Payer: Self-pay

## 2015-12-14 DIAGNOSIS — Z1231 Encounter for screening mammogram for malignant neoplasm of breast: Secondary | ICD-10-CM

## 2015-12-24 ENCOUNTER — Ambulatory Visit
Admission: RE | Admit: 2015-12-24 | Discharge: 2015-12-24 | Disposition: A | Discharge status: Home / Self Care | Payer: 59 | Source: Ambulatory Visit

## 2015-12-24 DIAGNOSIS — Z1231 Encounter for screening mammogram for malignant neoplasm of breast: Secondary | ICD-10-CM

## 2016-01-23 IMAGING — CR DG LUMBAR SPINE COMPLETE 4+V
5 series · 5 of 5 positions shown · non-contrast
Comparison: None.

CLINICAL DATA: Lumbar spine pain for several months, no known
injury, additional encounter

EXAM:
LUMBAR SPINE - COMPLETE 4+ VIEW

[t l-spine a.p.]
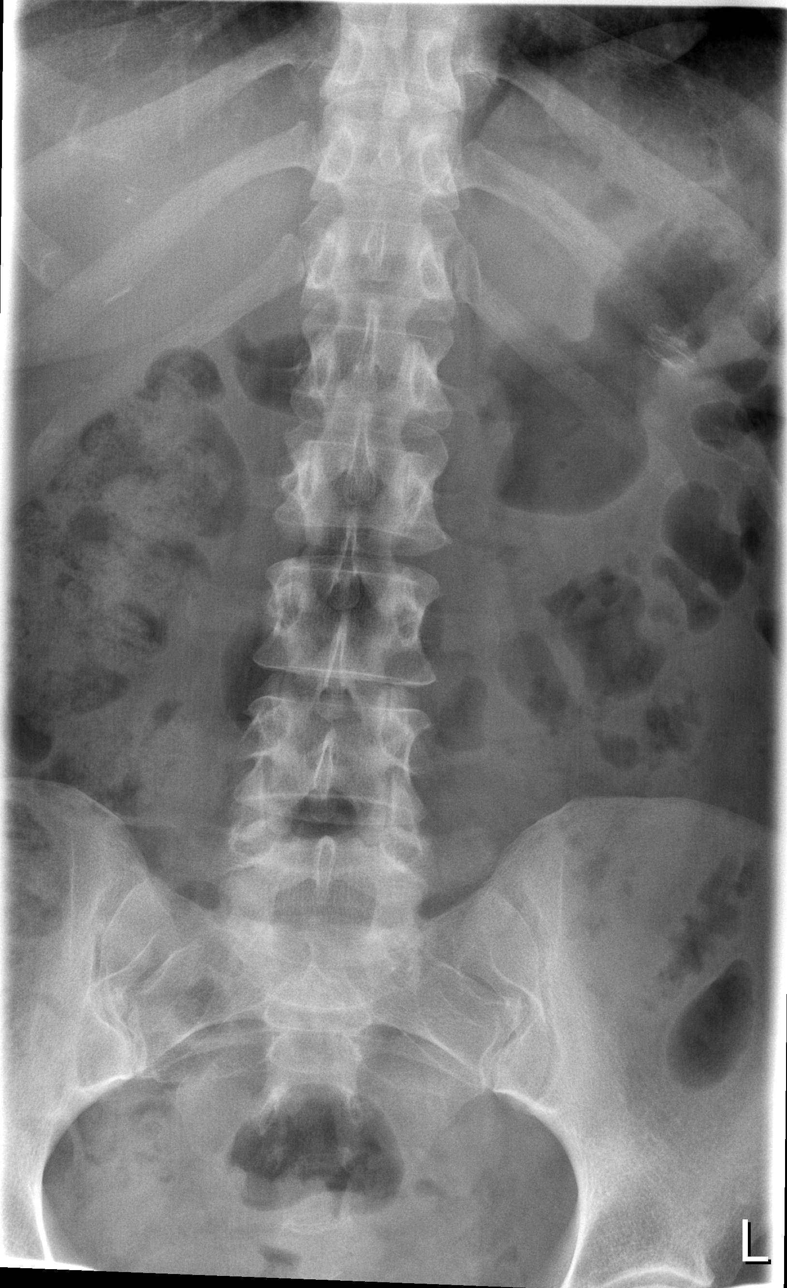

[t l-spine oblique exposure (1 of 2)]
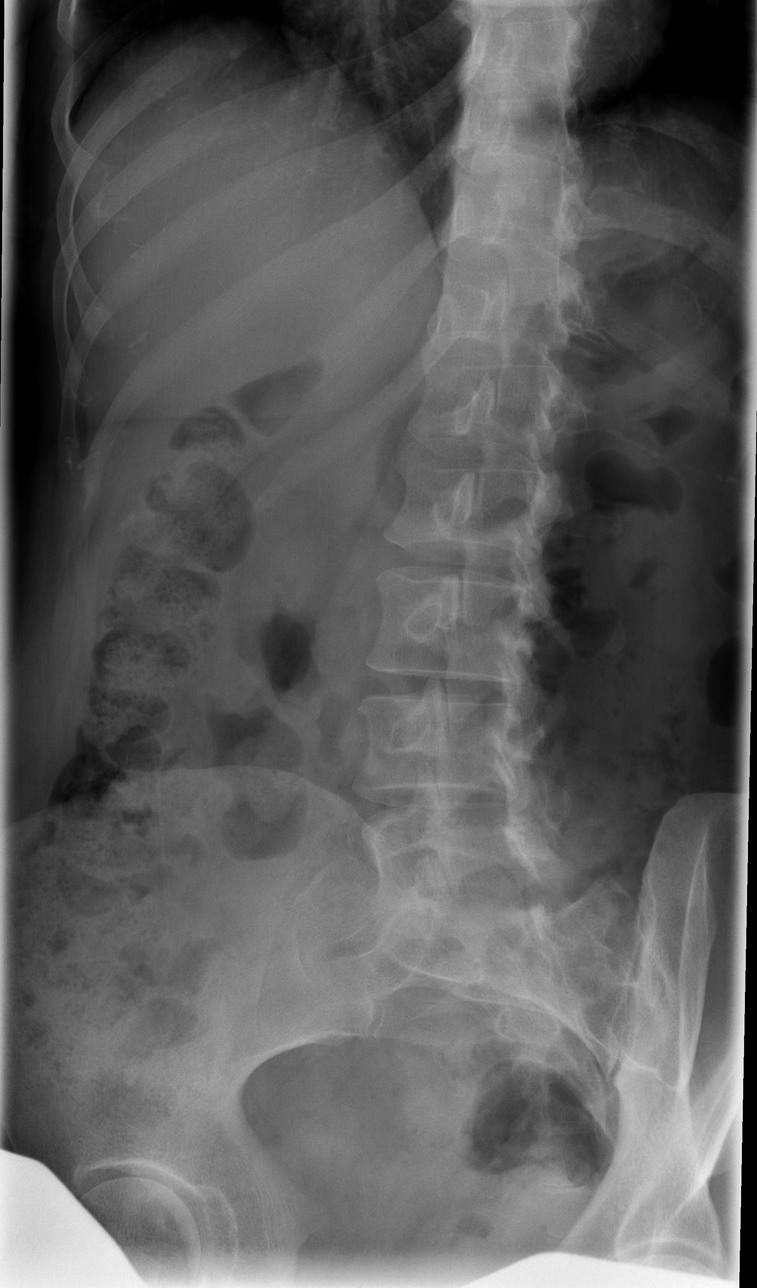

[t l-spine oblique exposure (2 of 2)]
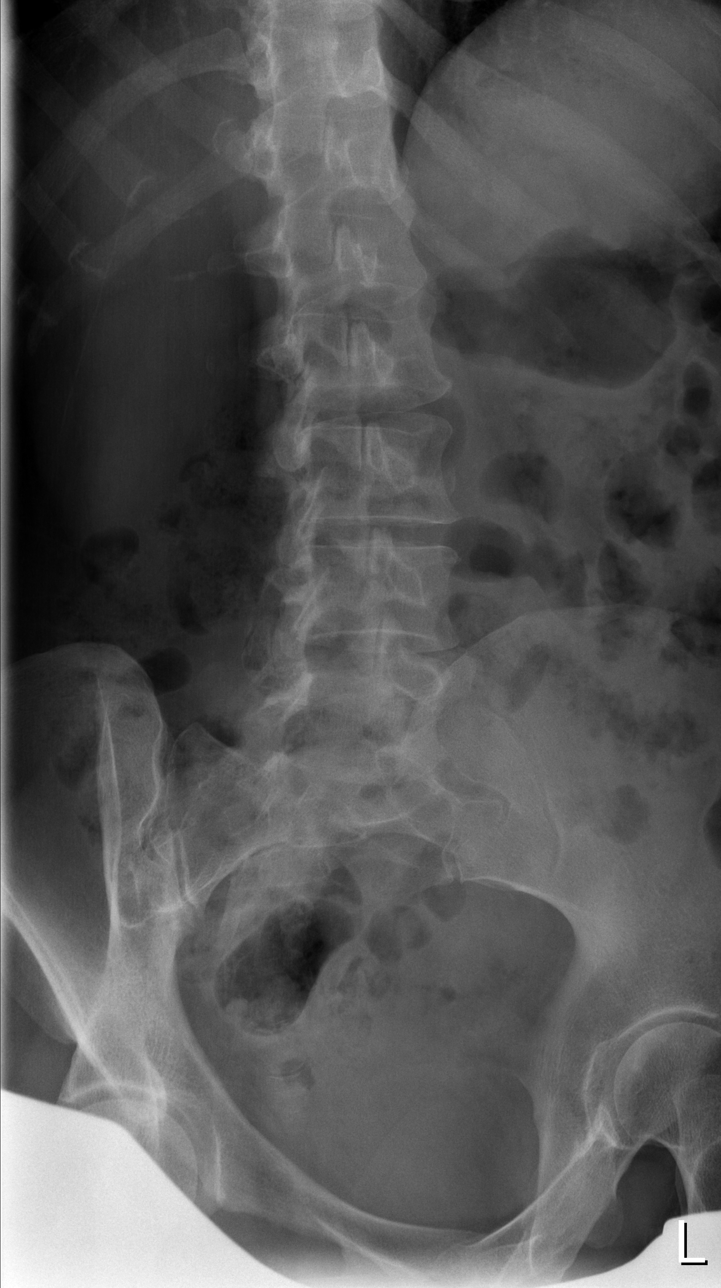

[t l-spine lat]
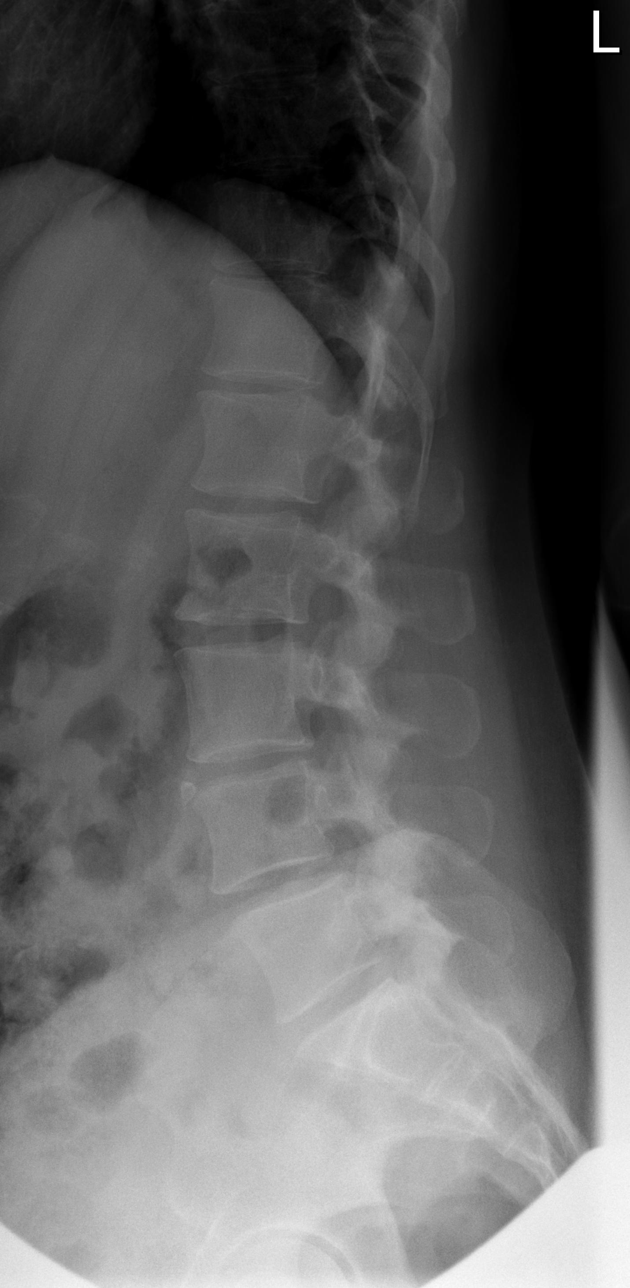

[t l-spine l5-s1 spot]
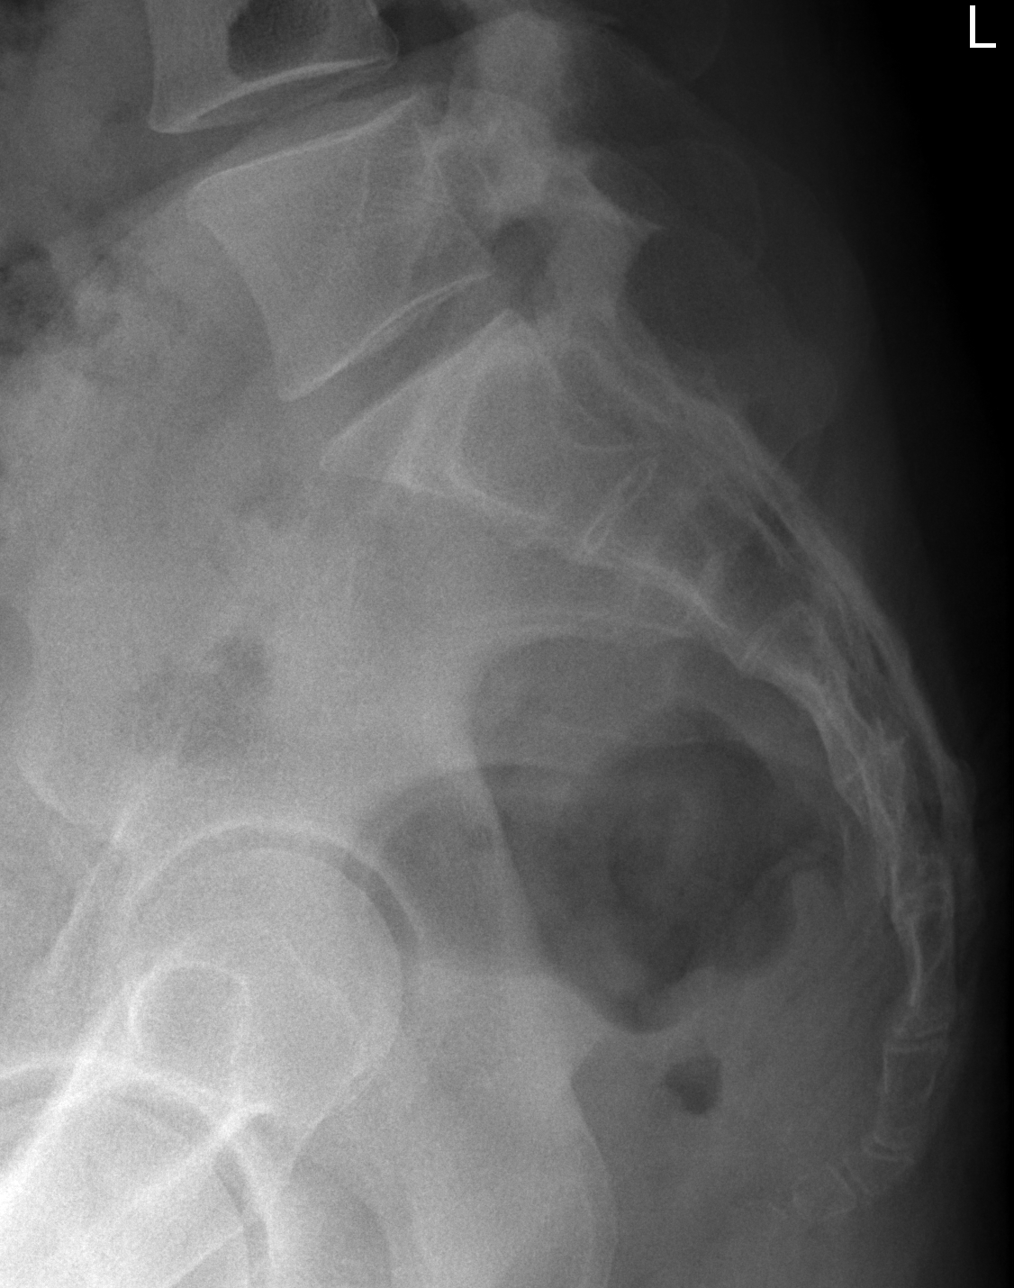

[5 of 5 positions shown; findings below may reference images not displayed]

FINDINGS: Five lumbar type vertebral bodies are well visualized. Limbus
vertebra is noted at L4. No acute abnormality is identified.
IMPRESSION: No acute abnormality seen.

## 2016-03-30 ENCOUNTER — Encounter: Payer: Self-pay | Admitting: Family Medicine

## 2016-03-30 ENCOUNTER — Ambulatory Visit (INDEPENDENT_AMBULATORY_CARE_PROVIDER_SITE_OTHER): Payer: 59 | Admitting: Family Medicine

## 2016-03-30 ENCOUNTER — Telehealth: Payer: Self-pay | Admitting: Family Medicine

## 2016-03-30 VITALS — BP 112/73 | HR 80 | Temp 98.1°F | Resp 20 | Wt 130.8 lb

## 2016-03-30 DIAGNOSIS — W57XXXA Bitten or stung by nonvenomous insect and other nonvenomous arthropods, initial encounter: Secondary | ICD-10-CM | POA: Diagnosis not present

## 2016-03-30 DIAGNOSIS — T148 Other injury of unspecified body region: Secondary | ICD-10-CM

## 2016-03-30 DIAGNOSIS — J019 Acute sinusitis, unspecified: Secondary | ICD-10-CM

## 2016-03-30 DIAGNOSIS — B9689 Other specified bacterial agents as the cause of diseases classified elsewhere: Secondary | ICD-10-CM

## 2016-03-30 MED ORDER — AMOXICILLIN-POT CLAVULANATE 875-125 MG PO TABS: 1.0000 | ORAL_TABLET | Freq: Two times a day (BID) | ORAL | Status: DC

## 2016-03-30 NOTE — Telephone Encounter (Signed)
OK with me.

## 2016-03-30 NOTE — Patient Instructions (Signed)
If no improvement on Friday afternoon, please get prescription filled and take until completed.  Allegra for allergies/mosiquito bites.  Rest, hydrate.  Try the stickers/mosiquito repellant.   Sinusitis, Adult Sinusitis is redness, soreness, and inflammation of the paranasal sinuses. Paranasal sinuses are air pockets within the bones of your face. They are located beneath your eyes, in the middle of your forehead, and above your eyes. In healthy paranasal sinuses, mucus is able to drain out, and air is able to circulate through them by way of your nose. However, when your paranasal sinuses are inflamed, mucus and air can become trapped. This can allow bacteria and other germs to grow and cause infection. Sinusitis can develop quickly and last only a short time (acute) or continue over a long period (chronic). Sinusitis that lasts for more than 12 weeks is considered chronic. CAUSES Causes of sinusitis include:  Allergies.  Structural abnormalities, such as displacement of the cartilage that separates your nostrils (deviated septum), which can decrease the air flow through your nose and sinuses and affect sinus drainage.  Functional abnormalities, such as when the small hairs (cilia) that line your sinuses and help remove mucus do not work properly or are not present. SIGNS AND SYMPTOMS Symptoms of acute and chronic sinusitis are the same. The primary symptoms are pain and pressure around the affected sinuses. Other symptoms include:  Upper toothache.  Earache.  Headache.  Bad breath.  Decreased sense of smell and taste.  A cough, which worsens when you are lying flat.  Fatigue.  Fever.  Thick drainage from your nose, which often is green and may contain pus (purulent).  Swelling and warmth over the affected sinuses. DIAGNOSIS Your health care provider will perform a physical exam. During your exam, your health care provider may perform any of the following to help determine if  you have acute sinusitis or chronic sinusitis:  Look in your nose for signs of abnormal growths in your nostrils (nasal polyps).  Tap over the affected sinus to check for signs of infection.  View the inside of your sinuses using an imaging device that has a light attached (endoscope). If your health care provider suspects that you have chronic sinusitis, one or more of the following tests may be recommended:  Allergy tests.  Nasal culture. A sample of mucus is taken from your nose, sent to a lab, and screened for bacteria.  Nasal cytology. A sample of mucus is taken from your nose and examined by your health care provider to determine if your sinusitis is related to an allergy. TREATMENT Most cases of acute sinusitis are related to a viral infection and will resolve on their own within 10 days. Sometimes, medicines are prescribed to help relieve symptoms of both acute and chronic sinusitis. These may include pain medicines, decongestants, nasal steroid sprays, or saline sprays. However, for sinusitis related to a bacterial infection, your health care provider will prescribe antibiotic medicines. These are medicines that will help kill the bacteria causing the infection. Rarely, sinusitis is caused by a fungal infection. In these cases, your health care provider will prescribe antifungal medicine. For some cases of chronic sinusitis, surgery is needed. Generally, these are cases in which sinusitis recurs more than 3 times per year, despite other treatments. HOME CARE INSTRUCTIONS  Drink plenty of water. Water helps thin the mucus so your sinuses can drain more easily.  Use a humidifier.  Inhale steam 3-4 times a day (for example, sit in the bathroom with the shower  running).  Apply a warm, moist washcloth to your face 3-4 times a day, or as directed by your health care provider.  Use saline nasal sprays to help moisten and clean your sinuses.  Take medicines only as directed by your  health care provider.  If you were prescribed either an antibiotic or antifungal medicine, finish it all even if you start to feel better.    This information is not intended to replace advice given to you by your health care provider. Make sure you discuss any questions you have with your health care provider.   Document Released: 09/05/2005 Document Revised: 09/26/2014 Document Reviewed: 09/20/2011 Elsevier Interactive Patient Education [employer]

## 2016-03-30 NOTE — Telephone Encounter (Signed)
°  Relationship to patient: Self  Can be reached: [phone]  Reason for call: Patient [facility] back [facility] are stating that Esperanza Richtersdward Saguier is not in network. Patient states she is not sure why her insurance is not paying but needs to have the issue solved.

## 2016-03-30 NOTE — Progress Notes (Signed)
Patient ID: Kendall FlackKausar Michon, female   DOB: [birth year], 45 y.o.   MRN: [id number]    Kendall FlackKausar Strough , [birth year], 45 y.o., female MRN: [id number] Patient Care Team    Relationship Specialty Notifications Start End  Bradd CanaryStacey A Blyth, MD PCP - General Family Medicine  07/22/11     CC: Mosiquito bites Subjective: Pt presents for an acute OV with complaints of Mosquito bites of  2 weeks duration. She states she is known to have sensitivity reaction to insect bites. She states she was doing yard work and noticed she had a bruise near her mosquito bite. She takes atarax and Claritin for allergies, but not regularly with bites. As tried OTC skin products for prevention, but still continues to have bites. She endorses lightlessness, nausea, claminess, throat tightness, cough (productive),  She denies fever, chills, vomit, myalgia, arthralgia, no nasal congestion , rhinorrhea, headache.   Allergies  Allergen Reactions  . Nizoral [Ketoconazole]     Burning and dry skin   Social History  Substance Use Topics  . Smoking status: Never Smoker   . Smokeless tobacco: Never Used  . Alcohol Use: Yes     Comment: 1 to 2 glasses 5 times a week. but then the next week maybe nothing   Past Medical History  Diagnosis Date  . Vitamin D deficiency 2012  . Chicken pox 45 yrs old  . Thyroid dysfunction 07/22/2011    history - resolved, no current problem  . Anemia     during pregnancy  . Fibrocystic breast 07/22/2011  . Pleurisy 08/29/2011    History - resolved  . Dysmenorrhea 10/20/2011  . Vaginitis 07/15/2012  . Endometriosis 12/22/2012  . Hyponatremia 12/22/2012  . Tinea versicolor 03/07/2013  . Preventative health care 03/07/2013  . 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 SVD (spontaneous vaginal delivery)     x 1  . Missed abortion     resolved on it's own - no surgery required.  Marland Kitchen. Headache(784.0)     otc med prn  . Anemia, unspecified 04/27/2014  . Hair loss 07/03/2014  . Low back pain  10/26/2014  . Allergy     seasonal   . Miliaria   . Lymphadenopathy 08/02/2015   Past Surgical History  Procedure Laterality Date  . Dental surgery  11/2013  . Laparoscopic supracervical hysterectomy N/A 12/17/2013    Procedure: LAPAROSCOPIC  SUPRACERVICAL HYSTERECTOMY WITH BILATERAL SALPINGECTOMY;  Surgeon: Oliver PilaKathy W Richardson, MD;  Location: [facility];  Service: Gynecology;  Laterality: N/A;  2hrs OR time   Family History  Problem Relation Age of Onset  . Diabetes Mother     type 2  . Stroke Mother   . Osteoporosis Mother   . Heart disease Mother     angina  . Heart attack Father     MI in his 2450s  . Diabetes Father     type 2  . Obesity Sister   . Diabetes Sister   . Obesity Brother   . Diabetes Brother     type 2  . Aneurysm Maternal Grandmother   . Heart attack Maternal Grandfather   . Pneumonia Paternal Grandfather   . Hypertension Sister     induced  . Other Sister     anemic  . Anemia Sister   . Depression Sister      Medication List       This list is accurate as of: 03/30/16  1:55 PM.  Always use your most recent med list.  B-complex with vitamin C tablet  Take 1 tablet by mouth daily.     FLAX SEED OIL PO  Take by mouth.     hydrOXYzine 10 MG tablet  Commonly known as:  ATARAX/VISTARIL  Take 1 tablet (10 mg total) by mouth 3 (three) times daily as needed for itching.     lactobacillus acidophilus Tabs tablet  Take 1 tablet by mouth daily. Reported on 03/30/2016     loratadine 10 MG tablet  Commonly known as:  CLARITIN  Take 10 mg by mouth daily as needed for allergies.     Magnesium 400 MG Caps  Take 400 mg by mouth daily. Reported on 03/30/2016     multivitamin tablet  Take 1 tablet by mouth daily.     triamcinolone ointment 0.1 %  Commonly known as:  KENALOG  APPLY TO AFFECTED AREA TWICE A DAY FOR 2 WEEKS.        No results found for this or any previous visit (from the past 24 hour(s)). No results found.   ROS:  Negative, with the exception of above mentioned in HPI   Objective:  BP 112/73 mmHg  Pulse 80  Temp(Src) 98.1 F (36.7 C)  Resp 20  Wt 130 lb 12.8 oz (59.33 kg)  SpO2 96%  LMP 12/03/2013 Body mass index is 23.92 kg/(m^2). Gen: Afebrile. No acute distress. Nontoxic in appearance, well developed, well nourished.  HENT: AT. [place]. Bilateral TM visualized WNL. MMM, no oral lesions. Bilateral nares mild erythema and swelling. Throat without erythema or exudates. Mild PND. No cough or hoarseness on exam.  Eyes:Pupils Equal Round Reactive to light, Extraocular movements intact,  Conjunctiva without redness, discharge or icterus. Neck/lymp/endocrine: Supple,no lymphadenopathy CV: RRR  Chest: CTAB, no wheeze or crackles. Good air movement, normal resp effort.  Abd: Soft. NTND. BS present Skin: no rashes, purpura or petechiae. 3 areas of hyperpigmentation thighs. X1 insect bite right achilles.  Neuro: Normal gait. PERLA. EOMi. Alert. Oriented x3  Psych: Normal affect, dress and demeanor. Normal speech (mildly tangential- needs redirected). Normal thought content and judgment.  Assessment/Plan: Loretha Ure is a 45 y.o. female present for acute OV for  Acute  sinusitis - Patient appears to have viral sinusitis. Discussed viral syndrome with the patient. Discussed abx are not helpful at this time. - Rest, hydrate, supportive therapy. Allegra daily.  - printed script for Abx. Pt to fill if symptoms are worsening or not improving by Friday afternoon.  - amoxicillin-clavulanate (AUGMENTIN) 875-125 MG tablet; Take 1 tablet by mouth 2 (two) times daily.  Dispense: 20 tablet; Refill: 0  Mosquito bite - insect bites x1 on right heel, not infected or swollen. Other bites she was referring are barely visible and only mild hyperpigmentation remains.  - Discussed with her prevention is key. She is to try the bracelets/stickers etc that are mosquito repellant when working outdoors.  - If she has bites she  is to take antihistamine and apply OTC steroid cream.   > 25 minutes spent with patient, >50% of time spent face to face counseling patient and coordinating care.   electronically signed by:  Felix Pacini, DO  [facility] - OR

## 2016-03-30 NOTE — Telephone Encounter (Signed)
Patient requesting to change pcp from Dr. Abner GreenspanBlyth to Dr. Claiborne BillingsKuneff due to Dr. Mariel AloeBlyth's limited availability.

## 2016-03-31 NOTE — Telephone Encounter (Signed)
It's ok with me. 

## 2016-04-06 NOTE — Telephone Encounter (Signed)
Called patient informed of change to bill for 02/23/15 added the date of 12/03/14 to charge correction. I will update patient once I heard back about March 2016 charges billing provider changed to blyth

## 2016-05-16 ENCOUNTER — Ambulatory Visit (INDEPENDENT_AMBULATORY_CARE_PROVIDER_SITE_OTHER): Payer: 59 | Admitting: Family Medicine

## 2016-05-16 ENCOUNTER — Encounter: Payer: Self-pay | Admitting: Family Medicine

## 2016-05-16 VITALS — BP 115/77 | HR 71 | Temp 97.7°F | Resp 18 | Wt 130.2 lb

## 2016-05-16 DIAGNOSIS — K529 Noninfective gastroenteritis and colitis, unspecified: Secondary | ICD-10-CM | POA: Diagnosis not present

## 2016-05-16 MED ORDER — ONDANSETRON HCL 4 MG PO TABS
4.0000 mg | ORAL_TABLET | Freq: Three times a day (TID) | ORAL | 0 refills | Status: DC | PRN
Start: 2016-05-16 — End: 2016-06-21

## 2016-05-16 MED ORDER — DICYCLOMINE HCL 10 MG PO CAPS: 10.0000 mg | ORAL_CAPSULE | Freq: Three times a day (TID) | ORAL | 0 refills | Status: DC

## 2016-05-16 NOTE — Patient Instructions (Signed)
Zofran for nausea Bentyl for a couple days before eating.   Clear liquid for 24 hours, then soft nondiary diet for 24 hours after, then slowly increase diet.   No Juices for a few days.   Food Poisoning Food poisoning is an illness caused by something you ate or drank. There are over 250 known causes of food poisoning. However, many other causes are unknown.You can be treated even if the exact cause of your food poisoning is not known. In most cases, food poisoning is mild and lasts 1 to 2 days. However, some cases can be serious, especially for people with low immune systems, the elderly, children and infants, and pregnant women. CAUSES  Poor personal hygiene, improper cleaning of storage and preparation areas, and unclean utensils can cause infection or tainting (contamination) of foods. The causes of food poisoning are numerous.Infectious agents, such as viruses, bacteria, or parasites, can cause harm by infecting the intestine and disrupting the absorption of nutrients and water. This can cause diarrhea and lead to dehydration. Viruses are responsible for most of the food poisonings in which an agent is found. Parasites are less likely to cause food poisoning. Toxic agents, such as poisonous mushrooms, marine algae, and pesticides can also cause food poisoning.  Viral causes of food poisoning include:  Norovirus.  Rotavirus.  Hepatitis A.  Bacterial causes of food poisoning include:  Salmonellae.  Campylobacter.  Bacillus cereus.  Escherichia coli (E. coli).  Shigella.  Listeria monocytogenes.  Clostridium botulinum (botulism).  Vibrio cholerae.  Parasites that can cause food poisoning include:  Giardia.  Cryptosporidium.  Toxoplasma. SYMPTOMS Symptoms may appear several hours or longer after consuming the contaminated food or drink. Symptoms may include:  Nausea.  Vomiting.  Cramping.  Diarrhea.  Fever and chills.  Muscle aches. DIAGNOSIS Your health  care provider may be able to diagnose food poisoning from a list of what you have recently eaten and results from lab tests. Diagnostic tests may include an exam of the feces. TREATMENT In most cases, treatment focuses on helping to relieve your symptoms and staying well hydrated. Antibiotic medicines are rarely needed. In severe cases, hospitalization may be required. HOME CARE INSTRUCTIONS   Drink enough water and fluids to keep your urine clear or pale yellow. Drink small amounts of fluids frequently and increase as tolerated.  Ask your health care provider for specific rehydration instructions.  Avoid:  Foods high in sugar.  Alcohol.  Carbonated drinks.  Tobacco.  Juice.  Caffeine drinks.  Extremely hot or cold fluids.  Fatty, greasy foods.  Too much intake of anything at one time.  Dairy products until 24 to 48 hours after diarrhea stops.  You may consume probiotics. Probiotics are active cultures of beneficial bacteria. They may lessen the amount and number of diarrheal stools in adults. Probiotics can be found in yogurt with active cultures and in supplements.  Wash your hands well to avoid spreading the bacteria.  Take medicines only as directed by your health care provider. Do not give your child aspirin because of the association with Reye's syndrome.  Ask your health care provider if you should continue to take your regular prescribed and over-the-counter medicines. PREVENTION   Wash your hands, food preparation surfaces, and utensils thoroughly before and after handling raw foods.  Keep refrigerated foods below 62F (5C).  Serve hot foods immediately or keep them heated above 162F (60C).  Divide large volumes of food into small portions for rapid cooling in the refrigerator. Hot, bulky  foods in the refrigerator can raise the temperature of other foods that have already cooled.  Follow approved canning procedures.  Heat canned foods thoroughly before  tasting.  When in doubt, throw it out.  Infants, the elderly, women who are pregnant, and people with compromised immune systems are especially susceptible to food poisoning. These people should never consume unpasteurized cheese, unpasteurized cider, raw fish, raw seafood, or raw meat-type products. SEEK IMMEDIATE MEDICAL CARE IF:   You have difficulty breathing, swallowing, talking, or moving.  You develop blurred vision.  You are unable to keep fluids down.  You faint or nearly faint.  Your eyes turn yellow.  Vomiting or diarrhea develops or becomes persistent.  Abdominal pain develops, increases, or localizes in one small area.  You have a fever.  The diarrhea becomes excessive or contains blood or mucus.  You develop excessive weakness, dizziness, or extreme thirst.  You have no urine for 8 hours. MAKE SURE YOU:   Understand these instructions.  Will watch your condition.  Will get help right away if you are not doing well or get worse.   This information is not intended to replace advice given to you by your health care provider. Make sure you discuss any questions you have with your health care provider.   Document Released: 06/03/2004 Document Revised: 09/26/2014 Document Reviewed: 03/09/2015 Elsevier Interactive Patient Education [employer]2016 Elsevier Inc.

## 2016-05-16 NOTE — Progress Notes (Signed)
Jackie [name] , [birth year], 45 y.o., female MRN: [id number] Patient Care Team    Relationship Specialty Notifications Start End  Bradd Canary, MD PCP - General Family Medicine  07/22/11     CC: abd pain  Subjective: Pt presents for an acute OV with complaints of abdominal pain of 3 days duration.  Associated symptoms include nausea, vomit and diarrhea. She is abdomen is sore for vomiting. She is able to tolerate liquids, but solids are causing severe cramping. She had a hamburger, well down,  In a restaurant and experienced severe cramping after consuming on sarurday. She is still getting cramps after eating today. She endorses mild headache. She denies fever, chills, rectal bleeding.   Allergies  Allergen Reactions  . Nizoral [Ketoconazole]     Burning and dry skin   Social History  Substance Use Topics  . Smoking status: Never Smoker  . Smokeless tobacco: Never Used  . Alcohol use Yes     Comment: 1 to 2 glasses 5 times a week. but then the next week maybe nothing   Past Medical History:  Diagnosis Date  . Allergy    seasonal   . Anemia    during pregnancy  . Anemia, unspecified 04/27/2014  . Chicken pox 46 yrs old  . Dysmenorrhea 10/20/2011  . Endometriosis 12/22/2012  . Fibrocystic breast 07/22/2011  . Hair loss 07/03/2014  . Headache(784.0)    otc med prn  . 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 Hyponatremia 12/22/2012  . Low back pain 10/26/2014  . Lymphadenopathy 08/02/2015  . Miliaria   . Missed abortion    resolved on it's own - no surgery required.  . Pleurisy 08/29/2011   History - resolved  . Preventative health care 03/07/2013  . SVD (spontaneous vaginal delivery)    x 1  . Thyroid dysfunction 07/22/2011   history - resolved, no current problem  . Tinea versicolor 03/07/2013  . Vaginitis 07/15/2012  . Vitamin D deficiency 2012   Past Surgical History:  Procedure Laterality Date  . DENTAL SURGERY  11/2013  . LAPAROSCOPIC SUPRACERVICAL  HYSTERECTOMY N/A 12/17/2013   Procedure: LAPAROSCOPIC  SUPRACERVICAL HYSTERECTOMY WITH BILATERAL SALPINGECTOMY;  Surgeon: Oliver Pila, MD;  Location: [facility];  Service: Gynecology;  Laterality: N/A;  2hrs OR time   Family History  Problem Relation Age of Onset  . Diabetes Mother     type 2  . Stroke Mother   . Osteoporosis Mother   . Heart disease Mother     angina  . Heart attack Father     MI in his 51s  . Diabetes Father     type 2  . Obesity Sister   . Diabetes Sister   . Obesity Brother   . Diabetes Brother     type 2  . Aneurysm Maternal Grandmother   . Heart attack Maternal Grandfather   . Pneumonia Paternal Grandfather   . Hypertension Sister     induced  . Other Sister     anemic  . Anemia Sister   . Depression Sister      Medication List       Accurate as of 05/16/16  1:47 PM. Always use your most recent med list.          B-complex with vitamin C tablet Take 1 tablet by mouth daily.   Biotin 1000 MCG tablet Take 1,000 mcg by mouth daily.   FLAX SEED OIL PO Take by mouth.  hydrOXYzine 10 MG tablet Commonly known as:  ATARAX/VISTARIL Take 1 tablet (10 mg total) by mouth 3 (three) times daily as needed for itching.   lactobacillus acidophilus Tabs tablet Take 1 tablet by mouth daily. Reported on 03/30/2016   loratadine 10 MG tablet Commonly known as:  CLARITIN Take 10 mg by mouth daily as needed for allergies.   Magnesium 400 MG Caps Take 400 mg by mouth daily. Reported on 03/30/2016   multivitamin tablet Take 1 tablet by mouth daily.   triamcinolone ointment 0.1 % Commonly known as:  KENALOG APPLY TO AFFECTED AREA TWICE A DAY FOR 2 WEEKS.       No results found for this or any previous visit (from the past 24 hour(s)). No results found.   ROS: Negative, with the exception of above mentioned in HPI   Objective:  BP 115/77 (BP Location: Left Arm, Patient Position: Sitting, Cuff Size: Normal)   Pulse 71   Temp 97.7 F (36.5  C) (Oral)   Resp 18   Wt 130 lb 4 oz (59.1 kg)   LMP 11/30/2013   SpO2 97%   BMI 23.82 kg/m  Body mass index is 23.82 kg/m. Gen: Afebrile. No acute distress. Nontoxic in appearance, well developed, well nourished.  HENT: AT. Jackie [name]. MMM, no oral lesions.  Eyes:Pupils Equal Round Reactive to light, Extraocular movements intact,  Conjunctiva without redness, discharge or icterus. Neck/lymp/endocrine: Supple,no lymphadenopathy CV: RRR  Abd: Soft. flat. NTND. BS present. no Masses palpated. No rebound or guarding.  Skin: no rashes, purpura or petechiae.  Neuro:  Normal gait. PERLA. EOMi. Alert. Oriented x3   Assessment/Plan: Jackie [name] is a 45 y.o. female present for acute OV for  Gastroenteritis Zofran, bentyl.  Rest, hydrate Clear liquid for 24 hours, then soft non-diary diet for 24 hours after, then forward diet.  No juice for a few days.  Appears well on exam F/U as needed  > 25 minutes spent with patient, >50% of time spent face to face counseling patient and coordinating care.    electronically signed by:  Felix Pacinienee Jacek Colson, DO  [facility] - OR

## 2016-06-10 ENCOUNTER — Encounter: Payer: 59 | Admitting: Family Medicine

## 2016-06-14 ENCOUNTER — Encounter: Payer: 59 | Admitting: Family Medicine

## 2016-06-15 ENCOUNTER — Encounter: Payer: 59 | Admitting: Family Medicine

## 2016-06-20 ENCOUNTER — Ambulatory Visit (INDEPENDENT_AMBULATORY_CARE_PROVIDER_SITE_OTHER): Payer: 59 | Admitting: Family Medicine

## 2016-06-20 ENCOUNTER — Encounter: Payer: Self-pay | Admitting: Family Medicine

## 2016-06-20 ENCOUNTER — Other Ambulatory Visit (HOSPITAL_COMMUNITY)
Admission: RE | Admit: 2016-06-20 | Discharge: 2016-06-20 | Disposition: A | Discharge status: Home / Self Care | Payer: 59 | Source: Ambulatory Visit | Attending: Family Medicine | Admitting: Family Medicine

## 2016-06-20 VITALS — BP 122/86 | HR 74 | Temp 98.0°F | Resp 16 | Ht 62.5 in | Wt 135.0 lb

## 2016-06-20 DIAGNOSIS — Z130 Encounter for screening for diseases of the blood and blood-forming organs and certain disorders involving the immune mechanism: Secondary | ICD-10-CM | POA: Diagnosis not present

## 2016-06-20 DIAGNOSIS — Z639 Problem related to primary support group, unspecified: Secondary | ICD-10-CM | POA: Diagnosis not present

## 2016-06-20 DIAGNOSIS — Z0000 Encounter for general adult medical examination without abnormal findings: Secondary | ICD-10-CM

## 2016-06-20 DIAGNOSIS — M792 Neuralgia and neuritis, unspecified: Secondary | ICD-10-CM

## 2016-06-20 DIAGNOSIS — Z1151 Encounter for screening for human papillomavirus (HPV): Secondary | ICD-10-CM | POA: Insufficient documentation

## 2016-06-20 DIAGNOSIS — Z01419 Encounter for gynecological examination (general) (routine) without abnormal findings: Secondary | ICD-10-CM | POA: Diagnosis present

## 2016-06-20 DIAGNOSIS — Z131 Encounter for screening for diabetes mellitus: Secondary | ICD-10-CM | POA: Diagnosis not present

## 2016-06-20 DIAGNOSIS — Z124 Encounter for screening for malignant neoplasm of cervix: Secondary | ICD-10-CM

## 2016-06-20 DIAGNOSIS — Z1329 Encounter for screening for other suspected endocrine disorder: Secondary | ICD-10-CM

## 2016-06-20 LAB — CBC WITH DIFFERENTIAL/PLATELET
Basophils Absolute: 0 10*3/uL (ref 0.0–0.1)
Basophils Relative: 0.4 % (ref 0.0–3.0)
Eosinophils Absolute: 0 10*3/uL (ref 0.0–0.7)
Eosinophils Relative: 0.7 % (ref 0.0–5.0)
HCT: 40.1 % (ref 36.0–46.0)
Hemoglobin: 13.7 g/dL (ref 12.0–15.0)
Lymphocytes Relative: 33.6 % (ref 12.0–46.0)
Lymphs Abs: 2.3 10*3/uL (ref 0.7–4.0)
MCHC: 34.1 g/dL (ref 30.0–36.0)
MCV: 93.2 fl (ref 78.0–100.0)
Monocytes Absolute: 0.5 10*3/uL (ref 0.1–1.0)
Monocytes Relative: 6.9 % (ref 3.0–12.0)
Neutro Abs: 4.1 10*3/uL (ref 1.4–7.7)
Neutrophils Relative %: 58.4 % (ref 43.0–77.0)
Platelets: 264 10*3/uL (ref 150.0–400.0)
RBC: 4.3 Mil/uL (ref 3.87–5.11)
RDW: 12.9 % (ref 11.5–15.5)
WBC: 6.9 10*3/uL (ref 4.0–10.5)

## 2016-06-20 LAB — COMPREHENSIVE METABOLIC PANEL
ALT: 20 U/L (ref 0–35)
AST: 23 U/L (ref 0–37)
Albumin: 4 g/dL (ref 3.5–5.2)
Alkaline Phosphatase: 45 U/L (ref 39–117)
BUN: 15 mg/dL (ref 6–23)
CO2: 27 mEq/L (ref 19–32)
Calcium: 9.1 mg/dL (ref 8.4–10.5)
Chloride: 101 mEq/L (ref 96–112)
Creatinine, Ser: 0.56 mg/dL (ref 0.40–1.20)
GFR: 123.76 mL/min (ref >60.00)
Glucose, Bld: 84 mg/dL (ref 70–99)
Potassium: 4.3 mEq/L (ref 3.5–5.1)
Sodium: 136 mEq/L (ref 135–145)
Total Bilirubin: 0.4 mg/dL (ref 0.2–1.2)
Total Protein: 7.2 g/dL (ref 6.0–8.3)

## 2016-06-20 LAB — HEMOGLOBIN A1C: Hgb A1c MFr Bld: 5.1 % (ref 4.6–6.5)

## 2016-06-20 LAB — VITAMIN B12: Vitamin B-12: 416 pg/mL (ref 211–911)

## 2016-06-20 LAB — TSH: TSH: 2.19 u[IU]/mL (ref 0.35–4.50)

## 2016-06-20 LAB — VITAMIN D 25 HYDROXY (VIT D DEFICIENCY, FRACTURES): VITD: 22.76 ng/mL — ABNORMAL LOW (ref 30.00–100.00)

## 2016-06-20 NOTE — Progress Notes (Signed)
Patient ID: Malanie Koloski, female  DOB: 02/07/1971, 45 y.o.   MRN: [id number] Patient Care Team    Relationship Specialty Notifications Start End  Ma Hillock, DO PCP - General Family Medicine  05/16/16     Subjective:  Jiah Bari is a 45 y.o.  Female  present for CPE with PAP. All past medical history, surgical history, allergies, family history, immunizations, medications and social history were updated in the electronic medical record today. All recent labs, ED visits and hospitalizations within the last year were reviewed.  Family circumstance/tingling sensations: pt states she is "Emotionally not in a great state. " the past few weeks. She is married to a man that she is 26 years older to her and she feels trapped, "frozen". She states she used to tell her self to wait until her son was old enough to go to college, buying time, her son is now 28. Her son is now telling her to make a life of her own. She feels her career and personal desires/beliefs have been unmet for quite a long time and she is just now trying to find herself. She feels her husband is narcicisstic and does not make room for anyone else's desires or beliefs. She feels she is unable to have much of a social life because he is controlling. She has recently started to to meet socially with other "moms" and he became angry with her. She is uncertain if he cheats on her or not, but feels it might be likely. She has talked to a psychiatrist, in which she was established in the past, for one session about current situation. She is unable to afford further sessions with her. She does not feel medications is something she wants to try or needs at this time. She does endorse tingling sensations on her scalp and globus sensation in her throat at times of increased anxiety.  Health maintenance:  Colonoscopy: completed 2009, by normal. No Fhx. Mammogram: completed 12/24/2015, birads 1. No Fhx. "Sensation around her left breast".  Performs SBE. She states it feels lumpy, she has a h/o fibrocystic disease. Cervical cancer screening: last pap: 02/19/2013, results: normal per pt.  Immunizations: tdap 2011, Influenza 2012 (encouraged yearly) Infectious disease screening: HIV completed 2013 DEXA: N/a Assistive device: None Oxygen use: none Patient has a Dental home. Hospitalizations/ED visits: None  Immunization History  Administered Date(s) Administered  . Hepatitis A 11/15/2002, 12/05/2005  . IPV 12/05/2005  . Td 05/12/1998, 12/05/2005  . Tdap 04/27/2010     Past Medical History:  Diagnosis Date  . Allergy    seasonal   . Anemia    during pregnancy  . Anemia, unspecified 04/27/2014  . Chicken pox 45 yrs old  . Dysmenorrhea 10/20/2011  . Endometriosis 12/22/2012  . Fibrocystic breast 07/22/2011  . Hair loss 07/03/2014  . Headache(784.0)    otc med prn  . 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 Hyponatremia 12/22/2012  . Low back pain 10/26/2014  . Lymphadenopathy 08/02/2015  . Miliaria   . Missed abortion    resolved on it's own - no surgery required.  . Pleurisy 08/29/2011   History - resolved  . Preventative health care 03/07/2013  . SVD (spontaneous vaginal delivery)    x 1  . Thyroid dysfunction 07/22/2011   history - resolved, no current problem  . Tinea versicolor 03/07/2013  . Vaginitis 07/15/2012  . Vitamin D deficiency 2012   Allergies  Allergen Reactions  . Nizoral [  Ketoconazole]     Burning and dry skin   Past Surgical History:  Procedure Laterality Date  . DENTAL SURGERY  11/2013  . LAPAROSCOPIC SUPRACERVICAL HYSTERECTOMY N/A 12/17/2013   Procedure: LAPAROSCOPIC  SUPRACERVICAL HYSTERECTOMY WITH BILATERAL SALPINGECTOMY;  Surgeon: Logan Bores, MD;  Location: [facility];  Service: Gynecology;  Laterality: N/A;  2hrs OR time   Family History  Problem Relation Age of Onset  . Diabetes Mother     type 2  . Stroke Mother   . Osteoporosis Mother   . Heart disease Mother      angina  . Heart attack Father     MI in his 23s  . Diabetes Father     type 2  . Obesity Sister   . Diabetes Sister   . Obesity Brother   . Diabetes Brother     type 2  . Aneurysm Maternal Grandmother   . Heart attack Maternal Grandfather   . Pneumonia Paternal Grandfather   . Hypertension Sister     induced  . Other Sister     anemic  . Anemia Sister   . Depression Sister    Social History   Social History  . Marital status: Married    Spouse name: N/A  . Number of children: 1  . Years of education: N/A   Occupational History  . Not on file.   Social History Main Topics  . Smoking status: Never Smoker  . Smokeless tobacco: Never Used  . Alcohol use Yes     Comment: 1 to 2 glasses 5 times a week. but then the next week maybe nothing  . Drug use: No  . Sexual activity: Yes    Partners: Male    Birth control/ protection: None   Other Topics Concern  . Not on file   Social History Narrative  . No narrative on file     Medication List       Accurate as of 06/20/16 12:20 PM. Always use your most recent med list.          B-complex with vitamin C tablet Take 1 tablet by mouth daily.   Biotin 1000 MCG tablet Take 1,000 mcg by mouth daily.   dicyclomine 10 MG capsule Commonly known as:  BENTYL Take 1 capsule (10 mg total) by mouth 4 (four) times daily -  before meals and at bedtime.   FLAX SEED OIL PO Take by mouth.   hydrOXYzine 10 MG tablet Commonly known as:  ATARAX/VISTARIL Take 1 tablet (10 mg total) by mouth 3 (three) times daily as needed for itching.   lactobacillus acidophilus Tabs tablet Take 1 tablet by mouth daily. Reported on 03/30/2016   loratadine 10 MG tablet Commonly known as:  CLARITIN Take 10 mg by mouth daily as needed for allergies.   Magnesium 400 MG Caps Take 400 mg by mouth daily. Reported on 03/30/2016   multivitamin tablet Take 1 tablet by mouth daily.   ondansetron 4 MG tablet Commonly known as:  ZOFRAN Take 1  tablet (4 mg total) by mouth every 8 (eight) hours as needed for nausea or vomiting.        No results found for this or any previous visit (from the past 2160 hour(s)).  Mm Screening Breast Tomo Bilateral  Result Date: 12/24/2015 CLINICAL DATA:  Screening. EXAM: 2D DIGITAL SCREENING BILATERAL MAMMOGRAM WITH CAD AND ADJUNCT TOMO COMPARISON:  Previous exam(s). ACR Breast Density Category c: The breast tissue is heterogeneously dense, which may  obscure small masses. FINDINGS: There are no findings suspicious for malignancy. Images were processed with CAD. IMPRESSION: No mammographic evidence of malignancy. A result letter of this screening mammogram will be mailed directly to the patient. RECOMMENDATION: Screening mammogram in one year. (Code:SM-B-01Y) BI-RADS CATEGORY  1: Negative. Electronically Signed   By: Franki Cabot M.D.   On: 12/24/2015 13:37     ROS: 14 pt review of systems performed and negative (unless mentioned in an HPI)  Objective: BP 122/86 (BP Location: Left Arm, Patient Position: Sitting, Cuff Size: Normal)   Pulse 74   Temp 98 F (36.7 C) (Oral)   Resp 16   Ht 5' 2.5" (1.588 m)   Wt 135 lb (61.2 kg)   LMP 11/30/2013   BMI 24.30 kg/m  Gen: Afebrile. No acute distress. Nontoxic in appearance, well-developed, well-nourished,  Thin soft spoken female.  HENT: AT. [place]. Bilateral TM visualized and normal in appearance, normal external auditory canal. MMM, no oral lesions, adequate dentition. Bilateral nares within normal limits. Throat without erythema, ulcerations or exudates. no Cough on exam, no hoarseness on exam. Eyes:Pupils Equal Round Reactive to light, Extraocular movements intact,  Conjunctiva without redness, discharge or icterus. Neck/lymp/endocrine: Supple,no lymphadenopathy, no thyromegaly CV: RRR no murmur, no edema, +2/4 P posterior tibialis pulses.  Chest: CTAB, no wheeze, rhonchi or crackles. Normal  Respiratory effort. good Air movement. Abd: Soft. flat.  NTND. BS present. no Masses palpated. No hepatosplenomegaly. No rebound tenderness or guarding. Skin: no rashes, purpura or petechiae. Warm and well-perfused. Skin intact. Neuro/Msk:  Normal gait. PERLA. EOMi. Alert. Oriented x3.  Cranial nerves II through XII intact. Muscle strength 5/5 upper/lower extremity. DTRs equal bilaterally. Psych: mildly anxious, tearful at times. Normal affect, dress and demeanor. Normal speech. Normal thought content and judgment. Breasts: breasts appear normal, symmetrical, no tenderness on exam, no suspicious masses, no skin or nipple changes or axillary nodes. Fibrocystic breast bilaterally.  GYN:  External genitalia within normal limits, normal hair distribution, no lesions. Urethral meatus normal, no lesions. Vaginal mucosa pink, moist, normal rugae, no lesions. No cystocele or rectocele. cervix without lesions, multiple nabothian cyst present, no discharge.  Anus and perineum within normal limits, no lesions.   Assessment/plan: Tajana Crotteau is a 45 y.o. female present for CPE with pap.  Encounter for preventive health examination colonoscopy: completed 2009, by normal. No Fhx. Mammogram: completed 12/24/2015, birads 1. No Fhx. "Sensation around her left breast". Performs SBE. She states it feels lumpy, she has a h/o fibrocystic disease. Cervical cancer screening: last pap: 02/19/2013, results: normal per pt.  Immunizations: tdap 2011, Influenza 2012 (encouraged yearly) Infectious disease screening: HIV completed 2013 Patient was encouraged to exercise greater than 150 minutes a week. Patient was encouraged to choose a diet filled with fresh fruits and vegetables, and lean meats. AVS provided to patient today for education/recommendation on gender specific health and safety maintenance. Cervical cancer screening - Cytology - PAP with HPV Screening for deficiency anemia - CBC w/Diff - Comp Met (CMET) Diabetes mellitus screening - HgB A1c Thyroid disorder  screen - TSH  Neuralgia/tingling sensation - likely manifestations of her anxiety, pt admits to these sensations during levels of increased anxiety. She declines medication considerations at this time.  - pt was provided with information for [facility] [place].  - She is to continue monitoring and if worsening we should move forard with further studies vs neurological referral.  - Vitamin D (25 hydroxy) - B12  Family circumstance - > 15  minutes spent face to face with pt on this matter.  - pt was encouraged to seek counseling. [facility] contact information was provided to patient. There was no safety concerns identified today.    Return in about 1 year (around 06/20/2017) for CPE.  Electronically signed by: Howard Pouch, DO [facility]

## 2016-06-20 NOTE — Progress Notes (Signed)
Pre visit review using our clinic review tool, if applicable. No additional management support is needed unless otherwise documented below in the visit note. 

## 2016-06-20 NOTE — Patient Instructions (Signed)
Health Maintenance, Female Adopting a healthy lifestyle and getting preventive care can go a long way to promote health and wellness. Talk with your health care provider about what schedule of regular examinations is right for you. This is a good chance for you to check in with your provider about disease prevention and staying healthy. In between checkups, there are plenty of things you can do on your own. Experts have done a lot of research about which lifestyle changes and preventive measures are most likely to keep you healthy. Ask your health care provider for more information. WEIGHT AND DIET  Eat a healthy diet  Be sure to include plenty of vegetables, fruits, low-fat dairy products, and lean protein.  Do not eat a lot of foods high in solid fats, added sugars, or salt.  Get regular exercise. This is one of the most important things you can do for your health.  Most adults should exercise for at least 150 minutes each week. The exercise should increase your heart rate and make you sweat (moderate-intensity exercise).  Most adults should also do strengthening exercises at least twice a week. This is in addition to the moderate-intensity exercise.  Maintain a healthy weight  Body mass index (BMI) is a measurement that can be used to identify possible weight problems. It estimates body fat based on height and weight. Your health care provider can help determine your BMI and help you achieve or maintain a healthy weight.  For females 20 years of age and older:   A BMI below 18.5 is considered underweight.  A BMI of 18.5 to 24.9 is normal.  A BMI of 25 to 29.9 is considered overweight.  A BMI of 30 and above is considered obese.  Watch levels of cholesterol and blood lipids  You should start having your blood tested for lipids and cholesterol at 46 years of age, then have this test every 5 years.  You may need to have your cholesterol levels checked more often if:  Your lipid  or cholesterol levels are high.  You are older than 46 years of age.  You are at high risk for heart disease.  CANCER SCREENING   Lung Cancer  Lung cancer screening is recommended for adults 55-80 years old who are at high risk for lung cancer because of a history of smoking.  A yearly low-dose CT scan of the lungs is recommended for people who:  Currently smoke.  Have quit within the past 15 years.  Have at least a 30-pack-year history of smoking. A pack year is smoking an average of one pack of cigarettes a day for 1 year.  Yearly screening should continue until it has been 15 years since you quit.  Yearly screening should stop if you develop a health problem that would prevent you from having lung cancer treatment.  Breast Cancer  Practice breast self-awareness. This means understanding how your breasts normally appear and feel.  It also means doing regular breast self-exams. Let your health care provider know about any changes, no matter how small.  If you are in your 20s or 30s, you should have a clinical breast exam (CBE) by a health care provider every 1-3 years as part of a regular health exam.  If you are 40 or older, have a CBE every year. Also consider having a breast X-ray (mammogram) every year.  If you have a family history of breast cancer, talk to your health care provider about genetic screening.  If you   are at high risk for breast cancer, talk to your health care provider about having an MRI and a mammogram every year.  Breast cancer gene (BRCA) assessment is recommended for women who have family members with BRCA-related cancers. BRCA-related cancers include:  Breast.  Ovarian.  Tubal.  Peritoneal cancers.  Results of the assessment will determine the need for genetic counseling and BRCA1 and BRCA2 testing. Cervical Cancer Your health care provider may recommend that you be screened regularly for cancer of the pelvic organs (ovaries, uterus, and  vagina). This screening involves a pelvic examination, including checking for microscopic changes to the surface of your cervix (Pap test). You may be encouraged to have this screening done every 3 years, beginning at age 21.  For women ages 30-65, health care providers may recommend pelvic exams and Pap testing every 3 years, or they may recommend the Pap and pelvic exam, combined with testing for human papilloma virus (HPV), every 5 years. Some types of HPV increase your risk of cervical cancer. Testing for HPV may also be done on women of any age with unclear Pap test results.  Other health care providers may not recommend any screening for nonpregnant women who are considered low risk for pelvic cancer and who do not have symptoms. Ask your health care provider if a screening pelvic exam is right for you.  If you have had past treatment for cervical cancer or a condition that could lead to cancer, you need Pap tests and screening for cancer for at least 20 years after your treatment. If Pap tests have been discontinued, your risk factors (such as having a new sexual partner) need to be reassessed to determine if screening should resume. Some women have medical problems that increase the chance of getting cervical cancer. In these cases, your health care provider may recommend more frequent screening and Pap tests. Colorectal Cancer  This type of cancer can be detected and often prevented.  Routine colorectal cancer screening usually begins at 46 years of age and continues through 46 years of age.  Your health care provider may recommend screening at an earlier age if you have risk factors for colon cancer.  Your health care provider may also recommend using home test kits to check for hidden blood in the stool.  A small camera at the end of a tube can be used to examine your colon directly (sigmoidoscopy or colonoscopy). This is done to check for the earliest forms of colorectal  cancer.  Routine screening usually begins at age 50.  Direct examination of the colon should be repeated every 5-10 years through 46 years of age. However, you may need to be screened more often if early forms of precancerous polyps or small growths are found. Skin Cancer  Check your skin from head to toe regularly.  Tell your health care provider about any new moles or changes in moles, especially if there is a change in a mole's shape or color.  Also tell your health care provider if you have a mole that is larger than the size of a pencil eraser.  Always use sunscreen. Apply sunscreen liberally and repeatedly throughout the day.  Protect yourself by wearing long sleeves, pants, a wide-brimmed hat, and sunglasses whenever you are outside. HEART DISEASE, DIABETES, AND HIGH BLOOD PRESSURE   High blood pressure causes heart disease and increases the risk of stroke. High blood pressure is more likely to develop in:  People who have blood pressure in the high end   of the normal range (130-139/85-89 mm Hg).  People who are overweight or obese.  People who are African American.  If you are 38-23 years of age, have your blood pressure checked every 3-5 years. If you are 61 years of age or older, have your blood pressure checked every year. You should have your blood pressure measured twice--once when you are at a hospital or clinic, and once when you are not at a hospital or clinic. Record the average of the two measurements. To check your blood pressure when you are not at a hospital or clinic, you can use:  An automated blood pressure machine at a pharmacy.  A home blood pressure monitor.  If you are between 45 years and 39 years old, ask your health care provider if you should take aspirin to prevent strokes.  Have regular diabetes screenings. This involves taking a blood sample to check your fasting blood sugar level.  If you are at a normal weight and have a low risk for diabetes,  have this test once every three years after 46 years of age.  If you are overweight and have a high risk for diabetes, consider being tested at a younger age or more often. PREVENTING INFECTION  Hepatitis B  If you have a higher risk for hepatitis B, you should be screened for this virus. You are considered at high risk for hepatitis B if:  You were born in a country where hepatitis B is common. Ask your health care provider which countries are considered high risk.  Your parents were born in a high-risk country, and you have not been immunized against hepatitis B (hepatitis B vaccine).  You have HIV or AIDS.  You use needles to inject street drugs.  You live with someone who has hepatitis B.  You have had sex with someone who has hepatitis B.  You get hemodialysis treatment.  You take certain medicines for conditions, including cancer, organ transplantation, and autoimmune conditions. Hepatitis C  Blood testing is recommended for:  Everyone born from [birth year] through 1966.  Anyone with known risk factors for hepatitis C. Sexually transmitted infections (STIs)  You should be screened for sexually transmitted infections (STIs) including gonorrhea and chlamydia if:  You are sexually active and are younger than 46 years of age.  You are older than 46 years of age and your health care provider tells you that you are at risk for this type of infection.  Your sexual activity has changed since you were last screened and you are at an increased risk for chlamydia or gonorrhea. Ask your health care provider if you are at risk.  If you do not have HIV, but are at risk, it may be recommended that you take a prescription medicine daily to prevent HIV infection. This is called pre-exposure prophylaxis (PrEP). You are considered at risk if:  You are sexually active and do not regularly use condoms or know the HIV status of your partner(s).  You take drugs by injection.  You are sexually  active with a partner who has HIV. Talk with your health care provider about whether you are at high risk of being infected with HIV. If you choose to begin PrEP, you should first be tested for HIV. You should then be tested every 3 months for as long as you are taking PrEP.  PREGNANCY   If you are premenopausal and you may become pregnant, ask your health care provider about preconception counseling.  If you may  become pregnant, take 400 to 800 micrograms (mcg) of folic acid every day.  If you want to prevent pregnancy, talk to your health care provider about birth control (contraception). OSTEOPOROSIS AND MENOPAUSE   Osteoporosis is a disease in which the bones lose minerals and strength with aging. This can result in serious bone fractures. Your risk for osteoporosis can be identified using a bone density scan.  If you are 46 years of age or older, or if you are at risk for osteoporosis and fractures, ask your health care provider if you should be screened.  Ask your health care provider whether you should take a calcium or vitamin D supplement to lower your risk for osteoporosis.  Menopause may have certain physical symptoms and risks.  Hormone replacement therapy may reduce some of these symptoms and risks. Talk to your health care provider about whether hormone replacement therapy is right for you.  HOME CARE INSTRUCTIONS   Schedule regular health, dental, and eye exams.  Stay current with your immunizations.   Do not use any tobacco products including cigarettes, chewing tobacco, or electronic cigarettes.  If you are pregnant, do not drink alcohol.  If you are breastfeeding, limit how much and how often you drink alcohol.  Limit alcohol intake to no more than 1 drink per day for nonpregnant women. One drink equals 12 ounces of beer, 5 ounces of wine, or 1 ounces of hard liquor.  Do not use street drugs.  Do not share needles.  Ask your health care provider for help if  you need support or information about quitting drugs.  Tell your health care provider if you often feel depressed.  Tell your health care provider if you have ever been abused or do not feel safe at home.   This information is not intended to replace advice given to you by your health care provider. Make sure you discuss any questions you have with your health care provider.   Document Released: 03/21/2011 Document Revised: 09/26/2014 Document Reviewed: 08/07/2013 Elsevier Interactive Patient Education 2016 [place]: [address] [place], [place] [address]  Phone: [phone]

## 2016-06-21 ENCOUNTER — Telehealth: Payer: Self-pay | Admitting: Family Medicine

## 2016-06-21 LAB — CYTOLOGY - PAP

## 2016-06-21 NOTE — Telephone Encounter (Addendum)
Please call pt: - all her blood work labs look normal, with the exception of her vitamin D is low.  - pt should be encouraged to take 800 u of vitamin d daily with a meal.  - PAP results are also normal, repeat in 3 years.

## 2016-06-21 NOTE — Telephone Encounter (Signed)
Left message with instructions and results on patient voice mail [facility]

## 2016-09-16 ENCOUNTER — Ambulatory Visit (INDEPENDENT_AMBULATORY_CARE_PROVIDER_SITE_OTHER): Payer: 59 | Admitting: Family Medicine

## 2016-09-16 ENCOUNTER — Encounter: Payer: Self-pay | Admitting: Family Medicine

## 2016-09-16 VITALS — BP 124/86 | HR 76 | Temp 98.5°F | Resp 20 | Wt 139.0 lb

## 2016-09-16 DIAGNOSIS — J0100 Acute maxillary sinusitis, unspecified: Secondary | ICD-10-CM

## 2016-09-16 MED ORDER — DOXYCYCLINE HYCLATE 100 MG PO TABS
100.0000 mg | ORAL_TABLET | Freq: Two times a day (BID) | ORAL | 0 refills | Status: DC
Start: 2016-09-16 — End: 2016-09-28

## 2016-09-16 NOTE — Patient Instructions (Addendum)
Rest, hydrate Mucinex and/or robitussin.  flonase and nasal saline.  Doxycyline every 12 hours for 10 days      Sinusitis, Adult Sinusitis is soreness and inflammation of your sinuses. Sinuses are hollow spaces in the bones around your face. They are located:  Around your eyes.  In the middle of your forehead.  Behind your nose.  In your cheekbones. Your sinuses and nasal passages are lined with a stringy fluid (mucus). Mucus normally drains out of your sinuses. When your nasal tissues get inflamed or swollen, the mucus can get trapped or blocked so air cannot flow through your sinuses. This lets bacteria, viruses, and funguses grow, and that leads to infection. Follow these instructions at home: Medicines  Take, use, or apply over-the-counter and prescription medicines only as told by your doctor. These may include nasal sprays.  If you were prescribed an antibiotic medicine, take it as told by your doctor. Do not stop taking the antibiotic even if you start to feel better. Hydrate and Humidify  Drink enough water to keep your pee (urine) clear or pale yellow.  Use a cool mist humidifier to keep the humidity level in your home above 50%.  Breathe in steam for 10-15 minutes, 3-4 times a day or as told by your doctor. You can do this in the bathroom while a hot shower is running.  Try not to spend time in cool or dry air. Rest  Rest as much as possible.  Sleep with your head raised (elevated).  Make sure to get enough sleep each night. General instructions  Put a warm, moist washcloth on your face 3-4 times a day or as told by your doctor. This will help with discomfort.  Wash your hands often with soap and water. If there is no soap and water, use hand sanitizer.  Do not smoke. Avoid being around people who are smoking (secondhand smoke).  Keep all follow-up visits as told by your doctor. This is important. Contact a doctor if:  You have a fever.  Your symptoms  get worse.  Your symptoms do not get better within 10 days. Get help right away if:  You have a very bad headache.  You cannot stop throwing up (vomiting).  You have pain or swelling around your face or eyes.  You have trouble seeing.  You feel confused.  Your neck is stiff.  You have trouble breathing. This information is not intended to replace advice given to you by your health care provider. Make sure you discuss any questions you have with your health care provider. Document Released: 02/22/2008 Document Revised: 05/01/2016 Document Reviewed: 07/01/2015 Elsevier Interactive Patient Education  2017 [employer]

## 2016-09-16 NOTE — Progress Notes (Signed)
Jackie [name] , 03-02-1971, 45 y.o., female MRN: [id number] Patient Care Team    Relationship Specialty Notifications Start End  Natalia Leatherwoodenee A Valoria Tamburri, DO PCP - General Family Medicine  05/16/16     CC: cough  Subjective: Pt presents for an acute OV with complaints of cough of > 1 week duration. She reports she experienced cough, sore throat, right ear pain, tender lymph nodes, nasal drainage and chills. He states she was taking over-the-counter medications such as Robitussin and Tylenol and initially started to see some improvement. Then within a few days her symptoms return and started to become worse. She states that her husband has been ill with similar symptoms. She denies nausea, vomit, diarrhea or fever. Allergies  Allergen Reactions  . Nizoral [Ketoconazole]     Burning and dry skin   Social History  Substance Use Topics  . Smoking status: Never Smoker  . Smokeless tobacco: Never Used  . Alcohol use Yes     Comment: 1 to 2 glasses 5 times a week. but then the next week maybe nothing   Past Medical History:  Diagnosis Date  . Allergy    seasonal   . Anemia    during pregnancy  . Anemia, unspecified 04/27/2014  . Chicken pox 45 yrs old  . Dysmenorrhea 10/20/2011  . Endometriosis 12/22/2012  . Fibrocystic breast 07/22/2011  . Hair loss 07/03/2014  . Headache(784.0)    otc med prn  . 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 Hyponatremia 12/22/2012  . Low back pain 10/26/2014  . Lymphadenopathy 08/02/2015  . Miliaria   . Missed abortion    resolved on it's own - no surgery required.  . Pleurisy 08/29/2011   History - resolved  . Preventative health care 03/07/2013  . SVD (spontaneous vaginal delivery)    x 1  . Thyroid dysfunction 07/22/2011   history - resolved, no current problem  . Tinea versicolor 03/07/2013  . Vaginitis 07/15/2012  . Vitamin D deficiency 2012   Past Surgical History:  Procedure Laterality Date  . DENTAL SURGERY  11/2013  . LAPAROSCOPIC  SUPRACERVICAL HYSTERECTOMY N/A 12/17/2013   Procedure: LAPAROSCOPIC  SUPRACERVICAL HYSTERECTOMY WITH BILATERAL SALPINGECTOMY;  Surgeon: Oliver PilaKathy W Richardson, MD;  Location: [facility];  Service: Gynecology;  Laterality: N/A;  2hrs OR time   Family History  Problem Relation Age of Onset  . Diabetes Mother     type 2  . Stroke Mother   . Osteoporosis Mother   . Heart disease Mother     angina  . Heart attack Father     MI in his 3350s  . Diabetes Father     type 2  . Obesity Sister   . Diabetes Sister   . Obesity Brother   . Diabetes Brother     type 2  . Aneurysm Maternal Grandmother   . Heart attack Maternal Grandfather   . Pneumonia Paternal Grandfather   . Hypertension Sister     induced  . Other Sister     anemic  . Anemia Sister   . Depression Sister    Allergies as of 09/16/2016      Reactions   Nizoral [ketoconazole]    Burning and dry skin      Medication List       Accurate as of 09/16/16  4:22 PM. Always use your most recent med list.          B-complex with vitamin C tablet Take 1 tablet by  mouth daily.   Biotin 1000 MCG tablet Take 1,000 mcg by mouth daily.   FLAX SEED OIL PO Take by mouth.   hydrOXYzine 10 MG tablet Commonly known as:  ATARAX/VISTARIL Take 1 tablet (10 mg total) by mouth 3 (three) times daily as needed for itching.   lactobacillus acidophilus Tabs tablet Take 1 tablet by mouth daily. Reported on 03/30/2016   loratadine 10 MG tablet Commonly known as:  CLARITIN Take 10 mg by mouth daily as needed for allergies.   Magnesium 400 MG Caps Take 400 mg by mouth daily. Reported on 03/30/2016   multivitamin tablet Take 1 tablet by mouth daily.       No results found for this or any previous visit (from the past 24 hour(s)). No results found.   ROS: Negative, with the exception of above mentioned in HPI   Objective:  BP 124/86 (BP Location: Right Arm, Patient Position: Sitting, Cuff Size: Normal)   Pulse 76   Temp 98.5 F  (36.9 C)   Resp 20   Wt 139 lb (63 kg)   LMP 11/30/2013   SpO2 98%   BMI 25.02 kg/m  Body mass index is 25.02 kg/m. Gen: Afebrile. No acute distress. Nontoxic in appearance, well developed, well nourished.  HENT: AT. [place]. Bilateral TM visualized Bilateral air-fluid levels. MMM, no oral lesions. Bilateral nares erythema and drainage. Throat without erythema or exudates. Postnasal drip present, tenderness to maxillary sinus pressure. Eyes:Pupils Equal Round Reactive to light, Extraocular movements intact,  Conjunctiva without redness, discharge or icterus. Neck/lymp/endocrine: Supple, bilateral anterior cervical lymphadenopathy CV: RRR  Chest: CTAB, no wheeze or crackles. Good air movement, normal resp effort.  Abd: Soft. NTND. BS present  Assessment/Plan: Jackie [name] is a 45 y.o. female present for acute OV for  Acute maxillary sinusitis, recurrence not specified - Rest, hydrate, Mucinex/Robitussin, Flonase and nasal saline recommended. - Doxycycline twice a day x 10 days prescribed. - Follow-up when necessary, or sooner if not improved or resolved by 2 weeks    electronically signed by:  Felix Pacinienee [place] Behlke, DO  [facility] - OR

## 2016-09-17 ENCOUNTER — Ambulatory Visit: Payer: 59 | Admitting: Family Medicine

## 2016-09-28 ENCOUNTER — Ambulatory Visit (INDEPENDENT_AMBULATORY_CARE_PROVIDER_SITE_OTHER): Payer: 59 | Admitting: Family Medicine

## 2016-09-28 ENCOUNTER — Encounter: Payer: Self-pay | Admitting: Family Medicine

## 2016-09-28 VITALS — BP 104/78 | HR 91 | Temp 97.9°F | Resp 20 | Wt 136.0 lb

## 2016-09-28 DIAGNOSIS — R059 Cough, unspecified: Secondary | ICD-10-CM

## 2016-09-28 DIAGNOSIS — J0101 Acute recurrent maxillary sinusitis: Secondary | ICD-10-CM | POA: Diagnosis not present

## 2016-09-28 DIAGNOSIS — R05 Cough: Secondary | ICD-10-CM | POA: Diagnosis not present

## 2016-09-28 MED ORDER — BENZONATATE 100 MG PO CAPS: 200.0000 mg | ORAL_CAPSULE | Freq: Two times a day (BID) | ORAL | 0 refills | Status: DC | PRN

## 2016-09-28 MED ORDER — AZITHROMYCIN 250 MG PO TABS: ORAL_TABLET | ORAL | 0 refills | Status: DC

## 2016-09-28 MED ORDER — METHYLPREDNISOLONE ACETATE 80 MG/ML IJ SUSP
80.0000 mg | Freq: Once | INTRAMUSCULAR | Status: AC
  Administered 2016-09-28: 80 mg via INTRAMUSCULAR

## 2016-09-28 NOTE — Patient Instructions (Addendum)
Start Z-pack today. Continue Mucinex DM. Hydrate. Tessalon perles called for cough you can safely take with mucinex DM if cough remains.   The cough can last for a few weeks after medications completed. You should start feeling better though.

## 2016-09-28 NOTE — Progress Notes (Signed)
Jackie [name] , 09-30-1970, 46 y.o., female MRN: [id number] Patient Care Team    Relationship Specialty Notifications Start End  Natalia Leatherwood, DO PCP - General Family Medicine  05/16/16     CC: cough  Subjective:   Pt states hr sinusitis/cough is orsening even on abx. She was seen about 10 days ago for a sinus infection and prescribed doxycyline. She reports compliance with medication, but never became better. She has been very fatigued with decrease appetite. She has been unable to tolerate much on her stomach, but I snot vomiting. She reports the cough is harsh and has been keeping her up at night. She did not start the mucinex, but has been taking the robitussin, she complains of increased phlegm production. She does endorse a mild episode of diarrhea for 2 days, but feels that was secondary to spoiled milk consumption.   Prior note:  Pt presents for an acute OV with complaints of cough of > 1 week duration. She reports she experienced cough, sore throat, right ear pain, tender lymph nodes, nasal drainage and chills. He states she was taking over-the-counter medications such as Robitussin and Tylenol and initially started to see some improvement. Then within a few days her symptoms return and started to become worse. She states that her husband has been ill with similar symptoms. She denies nausea, vomit, diarrhea or fever. Allergies  Allergen Reactions  . Nizoral [Ketoconazole]     Burning and dry skin   Social History  Substance Use Topics  . Smoking status: Never Smoker  . Smokeless tobacco: Never Used  . Alcohol use Yes     Comment: 1 to 2 glasses 5 times a week. but then the next week maybe nothing   Past Medical History:  Diagnosis Date  . Allergy    seasonal   . Anemia    during pregnancy  . Anemia, unspecified 04/27/2014  . Chicken pox 46 yrs old  . Dysmenorrhea 10/20/2011  . Endometriosis 12/22/2012  . Fibrocystic breast 07/22/2011  . Hair loss 07/03/2014  .  Headache(784.0)    otc med prn  . 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 Hyponatremia 12/22/2012  . Low back pain 10/26/2014  . Lymphadenopathy 08/02/2015  . Miliaria   . Missed abortion    resolved on it's own - no surgery required.  . Pleurisy 08/29/2011   History - resolved  . Preventative health care 03/07/2013  . SVD (spontaneous vaginal delivery)    x 1  . Thyroid dysfunction 07/22/2011   history - resolved, no current problem  . Tinea versicolor 03/07/2013  . Vaginitis 07/15/2012  . Vitamin D deficiency 2012   Past Surgical History:  Procedure Laterality Date  . DENTAL SURGERY  11/2013  . LAPAROSCOPIC SUPRACERVICAL HYSTERECTOMY N/A 12/17/2013   Procedure: LAPAROSCOPIC  SUPRACERVICAL HYSTERECTOMY WITH BILATERAL SALPINGECTOMY;  Surgeon: Oliver Pila, MD;  Location: [facility];  Service: Gynecology;  Laterality: N/A;  2hrs OR time   Family History  Problem Relation Age of Onset  . Diabetes Mother     type 2  . Stroke Mother   . Osteoporosis Mother   . Heart disease Mother     angina  . Heart attack Father     MI in his 94s  . Diabetes Father     type 2  . Obesity Sister   . Diabetes Sister   . Obesity Brother   . Diabetes Brother     type 2  .  Aneurysm Maternal Grandmother   . Heart attack Maternal Grandfather   . Pneumonia Paternal Grandfather   . Hypertension Sister     induced  . Other Sister     anemic  . Anemia Sister   . Depression Sister    Allergies as of 09/28/2016      Reactions   Nizoral [ketoconazole]    Burning and dry skin      Medication List       Accurate as of 09/28/16  8:44 AM. Always use your most recent med list.          B-complex with vitamin C tablet Take 1 tablet by mouth daily.   Biotin 1000 MCG tablet Take 1,000 mcg by mouth daily.   FLAX SEED OIL PO Take by mouth.   hydrOXYzine 10 MG tablet Commonly known as:  ATARAX/VISTARIL Take 1 tablet (10 mg total) by mouth 3 (three) times daily as needed for  itching.   lactobacillus acidophilus Tabs tablet Take 1 tablet by mouth daily. Reported on 03/30/2016   loratadine 10 MG tablet Commonly known as:  CLARITIN Take 10 mg by mouth daily as needed for allergies.   Magnesium 400 MG Caps Take 400 mg by mouth daily. Reported on 03/30/2016   multivitamin tablet Take 1 tablet by mouth daily.       No results found for this or any previous visit (from the past 24 hour(s)). No results found.   ROS: Negative, with the exception of above mentioned in HPI   Objective:  BP 104/78 (BP Location: Left Arm, Patient Position: Sitting, Cuff Size: Normal)   Pulse 91   Temp 97.9 F (36.6 C)   Resp 20   Wt 136 lb (61.7 kg)   LMP 11/30/2013   SpO2 99%   BMI 24.48 kg/m  Body mass index is 24.48 kg/m.  Gen: Afebrile. No acute distress.  HENT: AT. [place]. Bilateral TM visualized and left air-fluid levels. MMM. Bilateral nares with erythema and swelling. Throat without erythema or exudates. Cough present.  Eyes:Pupils Equal Round Reactive to light, Extraocular movements intact,  Conjunctiva without redness, discharge or icterus. Neck/lymp/endocrine: Supple,no lymphadenopathy CV: RRR * Chest: CTAB, no wheeze or crackles Abd: Soft. NTND. BS present. .   Assessment/Plan: Jackie [name] is a 46 y.o. female present for acute OV for  Acute maxillary sinusitis, recurrence not specified - Rest, hydrate, Mucinex/Robitussin, Flonase and nasal saline recommended. - Z-pack prescribed for possible pertussis coverage.  - IM depo medrol - F/U PRN  electronically signed by:  Felix Pacinienee Garnet Chatmon, DO  [facility] - [place]

## 2016-10-26 ENCOUNTER — Ambulatory Visit (INDEPENDENT_AMBULATORY_CARE_PROVIDER_SITE_OTHER): Payer: 59 | Admitting: Family Medicine

## 2016-10-26 ENCOUNTER — Telehealth: Payer: Self-pay | Admitting: Family Medicine

## 2016-10-26 ENCOUNTER — Ambulatory Visit (HOSPITAL_BASED_OUTPATIENT_CLINIC_OR_DEPARTMENT_OTHER)
Admission: RE | Admit: 2016-10-26 | Discharge: 2016-10-26 | Disposition: A | Discharge status: Home / Self Care | Payer: 59 | Source: Ambulatory Visit | Attending: Family Medicine | Admitting: Family Medicine

## 2016-10-26 ENCOUNTER — Encounter: Payer: Self-pay | Admitting: Family Medicine

## 2016-10-26 VITALS — BP 110/73 | HR 82 | Temp 98.1°F | Resp 20 | Wt 141.5 lb

## 2016-10-26 DIAGNOSIS — J309 Allergic rhinitis, unspecified: Secondary | ICD-10-CM

## 2016-10-26 DIAGNOSIS — R591 Generalized enlarged lymph nodes: Secondary | ICD-10-CM | POA: Diagnosis not present

## 2016-10-26 DIAGNOSIS — R05 Cough: Secondary | ICD-10-CM | POA: Insufficient documentation

## 2016-10-26 DIAGNOSIS — R053 Chronic cough: Secondary | ICD-10-CM

## 2016-10-26 DIAGNOSIS — H9203 Otalgia, bilateral: Secondary | ICD-10-CM

## 2016-10-26 DIAGNOSIS — Z639 Problem related to primary support group, unspecified: Secondary | ICD-10-CM

## 2016-10-26 LAB — CBC WITH DIFFERENTIAL/PLATELET
Basophils Absolute: 0.1 10*3/uL (ref 0.0–0.1)
Basophils Relative: 0.9 % (ref 0.0–3.0)
Eosinophils Absolute: 0 10*3/uL (ref 0.0–0.7)
Eosinophils Relative: 0.8 % (ref 0.0–5.0)
HCT: 42.2 % (ref 36.0–46.0)
Hemoglobin: 14.3 g/dL (ref 12.0–15.0)
Lymphocytes Relative: 38 % (ref 12.0–46.0)
Lymphs Abs: 2.2 10*3/uL (ref 0.7–4.0)
MCHC: 33.8 g/dL (ref 30.0–36.0)
MCV: 94.1 fl (ref 78.0–100.0)
Monocytes Absolute: 0.3 10*3/uL (ref 0.1–1.0)
Monocytes Relative: 5.4 % (ref 3.0–12.0)
Neutro Abs: 3.2 10*3/uL (ref 1.4–7.7)
Neutrophils Relative %: 54.9 % (ref 43.0–77.0)
Platelets: 288 10*3/uL (ref 150.0–400.0)
RBC: 4.48 Mil/uL (ref 3.87–5.11)
RDW: 12.8 % (ref 11.5–15.5)
WBC: 5.9 10*3/uL (ref 4.0–10.5)

## 2016-10-26 MED ORDER — MONTELUKAST SODIUM 10 MG PO TABS: 10.0000 mg | ORAL_TABLET | Freq: Every day | ORAL | 3 refills | Status: DC

## 2016-10-26 MED ORDER — TRIAMCINOLONE ACETONIDE 0.1 % EX CREA: 1.0000 "application " | TOPICAL_CREAM | Freq: Two times a day (BID) | CUTANEOUS | 0 refills | Status: DC

## 2016-10-26 NOTE — Progress Notes (Signed)
Jackie [name] , [birth year], 46 y.o., female MRN: [id number] Patient Care Team    Relationship Specialty Notifications Start End  Jackie Leatherwoodenee A Oseas Detty, DO PCP - General Family Medicine  05/16/16     CC: cough  Subjective:   Cough: pt presents today for repeat cough and phlegm production. Over the last 2-3 months this has been occurring frequently. She has been provided with Doxycyline, then a Z-pack with IM depo medrol about a month later. She reports compliance with claritin and  flonase daily. She states she was a little better for a few weeks. She then stopped using flonase and noticed in the last 24 hours increased mucous production, cough (especillay at night), sore throat, ear pressure. She states her chest will feel "tight" to breath, but not chest pain or shortness of breath. She has also used mucinex. She was unable to tolerate the tessalon perles. She denies heartburn and has been resistant to the possibility of GERD as cause.  She is unable to tolerate zyrtec.  She again discusses in length her home situation, lack of support from her husband, his unfaithful habits, her need to separate, but she states she is not ready yet. She has not started therapy as recommended.    Allergies  Allergen Reactions  . Nizoral [Ketoconazole]     Burning and dry skin   Social History  Substance Use Topics  . Smoking status: Never Smoker  . Smokeless tobacco: Never Used  . Alcohol use Yes     Comment: 1 to 2 glasses 5 times a week. but then the next week maybe nothing   Past Medical History:  Diagnosis Date  . Allergy    seasonal   . Anemia    during pregnancy  . Anemia, unspecified 04/27/2014  . Chicken pox 46 yrs old  . Dysmenorrhea 10/20/2011  . Endometriosis 12/22/2012  . Fibrocystic breast 07/22/2011  . Hair loss 07/03/2014  . Headache(784.0)    otc med prn  . 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 Hyponatremia 12/22/2012  . Low back pain 10/26/2014  . Lymphadenopathy  08/02/2015  . Miliaria   . Missed abortion    resolved on it's own - no surgery required.  . Pleurisy 08/29/2011   History - resolved  . Preventative health care 03/07/2013  . SVD (spontaneous vaginal delivery)    x 1  . Thyroid dysfunction 07/22/2011   history - resolved, no current problem  . Tinea versicolor 03/07/2013  . Vaginitis 07/15/2012  . Vitamin D deficiency 2012   Past Surgical History:  Procedure Laterality Date  . DENTAL SURGERY  11/2013  . LAPAROSCOPIC SUPRACERVICAL HYSTERECTOMY N/A 12/17/2013   Procedure: LAPAROSCOPIC  SUPRACERVICAL HYSTERECTOMY WITH BILATERAL SALPINGECTOMY;  Surgeon: Jackie PilaKathy W Richardson, MD;  Location: [facility];  Service: Gynecology;  Laterality: N/A;  2hrs OR time   Family History  Problem Relation Age of Onset  . Diabetes Mother     type 2  . Stroke Mother   . Osteoporosis Mother   . Heart disease Mother     angina  . Heart attack Father     MI in his 4650s  . Diabetes Father     type 2  . Obesity Sister   . Diabetes Sister   . Obesity Brother   . Diabetes Brother     type 2  . Aneurysm Maternal Grandmother   . Heart attack Maternal Grandfather   . Pneumonia Paternal Grandfather   . Hypertension Sister  induced  . Other Sister     anemic  . Anemia Sister   . Depression Sister    Allergies as of 10/26/2016      Reactions   Nizoral [ketoconazole]    Burning and dry skin      Medication List       Accurate as of 10/26/16 10:32 AM. Always use your most recent med list.          B-complex with vitamin C tablet Take 1 tablet by mouth daily.   benzonatate 100 MG capsule Commonly known as:  TESSALON PERLES Take 2 capsules (200 mg total) by mouth 2 (two) times daily as needed for cough.   Biotin 1000 MCG tablet Take 1,000 mcg by mouth daily.   FLAX SEED OIL PO Take by mouth.   hydrOXYzine 10 MG tablet Commonly known as:  ATARAX/VISTARIL Take 1 tablet (10 mg total) by mouth 3 (three) times daily as needed for itching.     lactobacillus acidophilus Tabs tablet Take 1 tablet by mouth daily. Reported on 03/30/2016   loratadine 10 MG tablet Commonly known as:  CLARITIN Take 10 mg by mouth daily as needed for allergies.   Magnesium 400 MG Caps Take 400 mg by mouth daily. Reported on 03/30/2016   multivitamin tablet Take 1 tablet by mouth daily.       No results found for this or any previous visit (from the past 24 hour(s)). No results found.   ROS: Negative, with the exception of above mentioned in HPI   Objective:  BP 110/73 (BP Location: Left Arm, Patient Position: Sitting, Cuff Size: Normal)   Pulse 82   Temp 98.1 F (36.7 C)   Resp 20   Wt 141 lb 8 oz (64.2 kg)   LMP 12/03/2013   SpO2 99%   BMI 25.47 kg/m  Body mass index is 25.47 kg/m.  Gen: Afebrile. No acute distress. Nontoxic in appearance.  HENT: AT. [place]. Bilateral TM visualized and normal in appearance. MMM. Bilateral nares minimal drainage, mild erythema, no blood. Throat without erythema or exudates. No PND. Cough present. No hoarseness present.  Eyes:Pupils Equal Round Reactive to light, Extraocular movements intact,  Conjunctiva without redness, discharge or icterus. Neck/lymp/endocrine: Supple, no lymphadenopathy, CV: RRR Chest: CTAB, no wheeze or crackles Abd: Soft. NTND. BS present.  Neuro:  Normal gait. PERLA. EOMi. Alert. Oriented.  Psych: talkative. tearful. Normal speech. Normal thought content and judgment.  Assessment/Plan: Jackie [name] is a 46 y.o. female present for acute OV for  Cough, persistent/Lymphadenopathy/Chronic allergic rhinitis, unspecified seasonality, unspecified trigger/Otalgia of both ears - discussed with pt this is likely from either uncontrolled allergies or GERD. This does not appear infectious at this time.  - Continue flonase. Change to allegra. Start Singulair. Obtain CXR.  - we have discussed GERD and PPI as potential cause, she does not seem to think it is possible.  - refer to ENT for  further eval.  - IgG, IgA, IgM - CBC w/Diff - DG Chest 2 View; Future - Ambulatory referral to ENT - montelukast (SINGULAIR) 10 MG tablet; Take 1 tablet (10 mg total) by mouth at bedtime.  Dispense: 30 tablet; Refill: 3 - F/U PRN  Family distress: - Discussed, multiple times,  with patient her marital affairs, recommendation on counseling and provided resources. There is nothing more I can offer her on this matter. She has to seek counseling and the help from a trained professional in that field. She does not desire medication assistance  at this time.   > 25 minutes spent with patient, >50% of time spent face to face counseling   electronically signed by:  Felix Pacini, DO  [facility] - [place]

## 2016-10-26 NOTE — Telephone Encounter (Signed)
Please call pt: - her cxr is normal  

## 2016-10-26 NOTE — Telephone Encounter (Signed)
Patient notified and verbalized understanding. 

## 2016-10-26 NOTE — Patient Instructions (Signed)
I strongly advise you to seek counseling/therapy.  I have referred you to ENT for the sinus/throat/lymph nodes  I have ordered chest xray and labs, we will call you with results.  Start allegra I have called in Singulair for you to start before bed.

## 2016-10-27 LAB — IGG, IGA, IGM
IgA: 392 mg/dL (ref 81–463)
IgG (Immunoglobin G), Serum: 1286 mg/dL (ref 694–1618)
IgM, Serum: 66 mg/dL (ref 48–271)

## 2016-10-28 ENCOUNTER — Telehealth: Payer: Self-pay | Admitting: Family Medicine

## 2016-10-28 NOTE — Telephone Encounter (Signed)
Detailed message left on voice mail per DPR.  

## 2016-10-28 NOTE — Telephone Encounter (Signed)
Please call pt: - her labs are all perfectly normal.

## 2016-11-11 ENCOUNTER — Other Ambulatory Visit: Payer: Self-pay | Admitting: Family Medicine

## 2016-11-11 DIAGNOSIS — Z1231 Encounter for screening mammogram for malignant neoplasm of breast: Secondary | ICD-10-CM

## 2016-12-15 ENCOUNTER — Encounter: Payer: Self-pay | Admitting: Family Medicine

## 2016-12-15 ENCOUNTER — Ambulatory Visit (INDEPENDENT_AMBULATORY_CARE_PROVIDER_SITE_OTHER): Payer: 59 | Admitting: Family Medicine

## 2016-12-15 VITALS — BP 110/62 | HR 71 | Temp 98.2°F | Ht 62.0 in | Wt 144.8 lb

## 2016-12-15 DIAGNOSIS — J340 Abscess, furuncle and carbuncle of nose: Secondary | ICD-10-CM | POA: Diagnosis not present

## 2016-12-15 MED ORDER — MUPIROCIN 2 % EX OINT: 1.0000 "application " | TOPICAL_OINTMENT | Freq: Two times a day (BID) | CUTANEOUS | 0 refills | Status: DC

## 2016-12-15 MED ORDER — CLINDAMYCIN HCL 300 MG PO CAPS: 300.0000 mg | ORAL_CAPSULE | Freq: Three times a day (TID) | ORAL | 0 refills | Status: DC

## 2016-12-15 NOTE — Progress Notes (Signed)
Jackie [name] is a 46 y.o. female here for a new problem.  History of Present Illness:  Insurance claims handlerAmber Agner, CMA, acting as scribe for Dr. Earlene PlaterWallace.  Chief Complaint  Patient presents with  . Acute Visit  . Nasal Pain   CC:  Patient states that last Friday she felt she had a "bump" inside the left side of her nose.  It was not painful but was uncomfortable.  Over the last 2 days, this spot has broken open and is painful.  She states if she touches the area it gets wet.  States she hasn't scratched the area.  Very painful to blow her nose.  Thinks she can smell dried blood in her nose.  She hasn't seen any blood on tissues.  States that the left side of her nose feels awkward.  No fevers.  No congestion.  States she had the flu in December.  She states she has taken several medications over the past couple of months.  She has tried saline nasal spray with no relief.  HPI: As above. No fevers, HA, sinus pain, or dizziness. No Hx of the same.   PMHx, SurgHx, SocialHx, Medications, and Allergies were reviewed in the Visit Navigator and updated as appropriate.  Current Medications:   Current Outpatient Prescriptions:  .  B Complex-C (B-COMPLEX WITH VITAMIN C) tablet, Take 1 tablet by mouth daily., Disp: , Rfl:  .  Biotin 1000 MCG tablet, Take 1,000 mcg by mouth daily., Disp: , Rfl:  .  Flaxseed, Linseed, (FLAX SEED OIL PO), Take by mouth., Disp: , Rfl:  .  lactobacillus acidophilus (BACID) TABS tablet, Take 1 tablet by mouth daily. Reported on 03/30/2016, Disp: , Rfl:  .  loratadine (CLARITIN) 10 MG tablet, Take 10 mg by mouth daily as needed for allergies., Disp: , Rfl:  .  Magnesium 400 MG CAPS, Take 400 mg by mouth daily. Reported on 03/30/2016, Disp: , Rfl:  .  montelukast (SINGULAIR) 10 MG tablet, Take 1 tablet (10 mg total) by mouth at bedtime., Disp: 30 tablet, Rfl: 3 .  Multiple Vitamin (MULTIVITAMIN) tablet, Take 1 tablet by mouth daily.  , Disp: , Rfl:  .  triamcinolone cream (KENALOG) 0.1 %,  Apply 1 application topically 2 (two) times daily., Disp: 30 g, Rfl: 0    Review of Systems:   Review of Systems  Constitutional: Negative for chills and fever.  HENT: Negative for congestion, ear pain and sore throat.   Eyes: Negative for blurred vision.  Respiratory: Negative for cough and shortness of breath.   Cardiovascular: Negative for chest pain and palpitations.  Gastrointestinal: Negative for abdominal pain, nausea and vomiting.  Genitourinary: Negative for frequency.  Musculoskeletal: Negative for back pain.  Skin: Negative for rash.  Neurological: Negative for loss of consciousness and headaches.    Vitals:   Vitals:   12/15/16 1018  BP: 110/62  Pulse: 71  Temp: 98.2 F (36.8 C)  TempSrc: Oral  SpO2: 97%  Weight: 144 lb 12.8 oz (65.7 kg)  Height: 5\' 2"  (1.575 m)     Body mass index is 26.48 kg/m.  Physical Exam:   Physical Exam  Constitutional: She appears well-nourished.  HENT:  Head: Normocephalic and atraumatic.  Right Ear: External ear normal.  Left Ear: External ear normal.  Mouth/Throat: Oropharynx is clear and moist.  Nares, Left: Small draining abscess.  Eyes: EOM are normal. Pupils are equal, round, and reactive to light.  Neck: Normal range of motion. Neck supple.  Cardiovascular: Normal  rate, regular rhythm, normal heart sounds and intact distal pulses.   Pulmonary/Chest: Effort normal.  Abdominal: Soft.  Skin: Skin is warm.  Psychiatric: She has a normal mood and affect. Her behavior is normal.  Nursing note and vitals reviewed.   Assessment and Plan:    Phoua was seen today for acute visit and nasal pain.  Diagnoses and all orders for this visit:  Nasal abscess Comments: Mild. Likely a result of microabrasion, recently tearful and blowing her nose a lot. See treatment below. Red flags reviewed.  Orders: -     clindamycin (CLEOCIN) 300 MG capsule; Take 1 capsule (300 mg total) by mouth 3 (three) times daily. -     mupirocin  ointment (BACTROBAN) 2 %; Place 1 application into the nose 2 (two) times daily.   . Reviewed expectations re: course of current medical issues. . Discussed self-management of symptoms. . Outlined signs and symptoms indicating need for more acute intervention. . Patient verbalized understanding and all questions were answered. . See orders for this visit as documented in the electronic medical record. . Patient received an After-Visit Summary.  CMA served as Neurosurgeon during this visit. History, Physical, and Plan performed by medical provider. Documentation and orders reviewed and attested to. Helane Rima, D.O.  Helane Rima, D.O.

## 2016-12-15 NOTE — Progress Notes (Signed)
Pre visit review using our clinic review tool, if applicable. No additional management support is needed unless otherwise documented below in the visit note. 

## 2016-12-21 ENCOUNTER — Telehealth: Payer: Self-pay | Admitting: Family Medicine

## 2016-12-21 NOTE — Telephone Encounter (Signed)
Patient is no longer taking the oral antibiotic.  States it made her feel very sick.  She had a "rough weekend" and did a lot of crying and blowing her nose.  States she had some bleeding from her nose.  Now feels that the sore is getting better again.  States she is going to try using the ointment for 2 more days to see how she does.  She will call the office back if she feels she needs to come in and be seen.  Offered the patient the opportunity to come in to be seen today or tomorrow, but she declined and states she will call her regular PCP or our office for an appointment if she doesn't feel she is getting better.

## 2016-12-21 NOTE — Telephone Encounter (Signed)
Patient is having a reaction to the pill that she is taking (stomach upset). Her nose is running from the ointment & is having nose bleeds.

## 2016-12-26 ENCOUNTER — Ambulatory Visit
Admission: RE | Admit: 2016-12-26 | Discharge: 2016-12-26 | Disposition: A | Discharge status: Home / Self Care | Payer: 59 | Source: Ambulatory Visit | Attending: Family Medicine | Admitting: Family Medicine

## 2016-12-26 ENCOUNTER — Other Ambulatory Visit: Payer: Self-pay | Admitting: Family Medicine

## 2016-12-26 ENCOUNTER — Telehealth: Payer: Self-pay | Admitting: *Deleted

## 2016-12-26 DIAGNOSIS — N6489 Other specified disorders of breast: Secondary | ICD-10-CM

## 2016-12-26 DIAGNOSIS — N6320 Unspecified lump in the left breast, unspecified quadrant: Secondary | ICD-10-CM

## 2016-12-26 DIAGNOSIS — Z1231 Encounter for screening mammogram for malignant neoplasm of breast: Secondary | ICD-10-CM

## 2016-12-26 NOTE — Telephone Encounter (Signed)
Breast center called states patient is there for mammogram and is c/o having discharge from nipple of right breast and a lump in her left breast . Request change to diagnostic mammogram with [place]. Verbal order given per Dr Claiborne Billings. They will fax order for signature.

## 2016-12-26 NOTE — Telephone Encounter (Signed)
noted 

## 2017-01-09 ENCOUNTER — Ambulatory Visit (HOSPITAL_BASED_OUTPATIENT_CLINIC_OR_DEPARTMENT_OTHER)
Admission: RE | Admit: 2017-01-09 | Discharge: 2017-01-09 | Disposition: A | Discharge status: Home / Self Care | Payer: 59 | Source: Ambulatory Visit | Attending: Family Medicine | Admitting: Family Medicine

## 2017-01-09 ENCOUNTER — Ambulatory Visit (INDEPENDENT_AMBULATORY_CARE_PROVIDER_SITE_OTHER): Payer: 59 | Admitting: Family Medicine

## 2017-01-09 ENCOUNTER — Encounter: Payer: Self-pay | Admitting: Family Medicine

## 2017-01-09 VITALS — BP 123/77 | HR 65 | Temp 97.6°F | Resp 20 | Wt 144.5 lb

## 2017-01-09 DIAGNOSIS — R079 Chest pain, unspecified: Secondary | ICD-10-CM | POA: Insufficient documentation

## 2017-01-09 LAB — COMPLETE METABOLIC PANEL WITH GFR
ALT: 16 U/L (ref 6–29)
AST: 22 U/L (ref 10–35)
Albumin: 4.3 g/dL (ref 3.6–5.1)
Alkaline Phosphatase: 51 U/L (ref 33–115)
BUN: 11 mg/dL (ref 7–25)
CO2: 25 mmol/L (ref 20–31)
Calcium: 9.6 mg/dL (ref 8.6–10.2)
Chloride: 101 mmol/L (ref 98–110)
Creat: 0.57 mg/dL (ref 0.50–1.10)
GFR, Est African American: >89 mL/min (ref >=60)
GFR, Est Non African American: >89 mL/min (ref >=60)
Glucose, Bld: 89 mg/dL (ref 65–99)
Potassium: 3.9 mmol/L (ref 3.5–5.3)
Sodium: 137 mmol/L (ref 135–146)
Total Bilirubin: 0.6 mg/dL (ref 0.2–1.2)
Total Protein: 7.6 g/dL (ref 6.1–8.1)

## 2017-01-09 NOTE — Patient Instructions (Signed)
EKG  today normal.  I have ordered the cxr at [facility] [place].   Labs today to look at pancreas/liver etc.    Pleurisy Pleurisy is irritation and swelling (inflammation) of the linings of your lungs (pleura). This can cause pain in your chest, back, or shoulder. It can also cause trouble breathing. Follow these instructions at home: Medicines   Take over-the-counter and prescription medicines only as told by your doctor.  If you were prescribed antibiotic medicine, take it as told by your doctor. Do not stop taking the antibiotic even if you start to feel better. Activity   Rest and return to your normal activities as told by your doctor. Ask your doctor what activities are safe for you.  Do not drive or use heavy machinery while taking prescription pain medicine. General instructions   Watch for any changes in your condition.  Take deep breaths often, even if it is painful. This can help prevent lung problems.  When lying down, lie on your painful side. This may help you feel less pain.  Do not smoke. If you need help quitting, ask your doctor.  Keep all follow-up visits as told by your doctor. This is important. Contact a doctor if:  You have pain that:  Gets worse.  Does not get better with medicine.  Lasts for more than 1 week.  You have a fever or chills.  You have a cough that does not get better at home.  You have trouble breathing that does not get better at home.  You cough up liquid that looks like pus (purulent secretions). Get help right away if:  Your lips, fingernails, or toenails turn dark or turn blue.  You cough up blood.  You have trouble breathing that gets worse.  You are making loud noises when you breathe (wheezing) and this gets worse.  You have pain that spreads to your neck, arms, or jaw.  You get a rash.  You throw up (vomit).  You pass out (faint). Summary  Pleurisy is irritation and swelling (inflammation) of the  linings of your lungs (pleura).  Pleurisy can cause pain and trouble breathing.  If you have a cough that does not get better at home, contact your doctor.  Get help right away if you are having trouble breathing and it is getting worse. This information is not intended to replace advice given to you by your health care provider. Make sure you discuss any questions you have with your health care provider. Document Released: 08/18/2008 Document Revised: 05/30/2016 Document Reviewed: 05/30/2016 Elsevier Interactive Patient Education  2017 [employer].

## 2017-01-09 NOTE — Progress Notes (Signed)
Jackie [name] , 1971/08/15, 46 y.o., female MRN: [id number] Patient Care Team    Relationship Specialty Notifications Start End  Natalia Leatherwood, DO PCP - General Family Medicine  05/16/16     CC: pain chest wall Subjective: Pt presents for an  OV with complaints of chest wall pain  of 1 week duration.  Scribed the pain as sharp "twinges" underneath her left breast. She denies any associated symptoms of nausea, vomit, fever, chills, cough, shortness of breath, diaphoresis, radiation of pain to jaw or arm. She has a family history of heart disease. She is under a great deal of emotional stress. She denies any injury. She did have an acute upper respiratory infection a few weeks prior to onset. She states she feels now her chest feels that all and heavy intermittently after the pain. She is experiencing the pain up to 2 times a day lasting for a couple minutes and time. She endorses nausea 1, feeling lightheaded 1 during the last week that has occurred separate from the pain. She endorses "brain freeze "in which she feels she just cannot focus.   No flowsheet data found.  Allergies  Allergen Reactions  . Clindamycin/Lincomycin Other (See Comments)    GI upset  . Nizoral [Ketoconazole]     Burning and dry skin   Social History  Substance Use Topics  . Smoking status: Never Smoker  . Smokeless tobacco: Never Used  . Alcohol use Yes     Comment: 1 to 2 glasses 5 times a week. but then the next week maybe nothing   Past Medical History:  Diagnosis Date  . Allergy    seasonal   . Anemia    during pregnancy  . Anemia, unspecified 04/27/2014  . Chicken pox 46 yrs old  . Dysmenorrhea 10/20/2011  . Endometriosis 12/22/2012  . Fibrocystic breast 07/22/2011  . Hair loss 07/03/2014  . Headache(784.0)    otc med prn  . 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 Hyponatremia 12/22/2012  . Low back pain 10/26/2014  . Lymphadenopathy 08/02/2015  . Miliaria   . Missed abortion    resolved on it's own - no surgery required.  . Pleurisy 08/29/2011   History - resolved  . Preventative health care 03/07/2013  . SVD (spontaneous vaginal delivery)    x 1  . Thyroid dysfunction 07/22/2011   history - resolved, no current problem  . Tinea versicolor 03/07/2013  . Vaginitis 07/15/2012  . Vitamin D deficiency 2012   Past Surgical History:  Procedure Laterality Date  . DENTAL SURGERY  11/2013  . LAPAROSCOPIC SUPRACERVICAL HYSTERECTOMY N/A 12/17/2013   Procedure: LAPAROSCOPIC  SUPRACERVICAL HYSTERECTOMY WITH BILATERAL SALPINGECTOMY;  Surgeon: Oliver Pila, MD;  Location: [facility];  Service: Gynecology;  Laterality: N/A;  2hrs OR time   Family History  Problem Relation Age of Onset  . Diabetes Mother     type 2  . Stroke Mother   . Osteoporosis Mother   . Heart disease Mother     angina  . Heart attack Father     MI in his 12s  . Diabetes Father     type 2  . Obesity Sister   . Diabetes Sister   . Obesity Brother   . Diabetes Brother     type 2  . Aneurysm Maternal Grandmother   . Heart attack Maternal Grandfather   . Pneumonia Paternal Grandfather   . Hypertension Sister     induced  .  Other Sister     anemic  . Anemia Sister   . Depression Sister    Allergies as of 01/09/2017      Reactions   Clindamycin/lincomycin Other (See Comments)   GI upset   Nizoral [ketoconazole]    Burning and dry skin      Medication List       Accurate as of 01/09/17  2:45 PM. Always use your most recent med list.          B-complex with vitamin C tablet Take 1 tablet by mouth daily.   FLAX SEED OIL PO Take by mouth.   lactobacillus acidophilus Tabs tablet Take 1 tablet by mouth daily. Reported on 03/30/2016   loratadine 10 MG tablet Commonly known as:  CLARITIN Take 10 mg by mouth daily as needed for allergies.   Magnesium 400 MG Caps Take 400 mg by mouth daily. Reported on 03/30/2016   montelukast 10 MG tablet Commonly known as:  SINGULAIR Take 1  tablet (10 mg total) by mouth at bedtime.   multivitamin tablet Take 1 tablet by mouth daily.   mupirocin ointment 2 % Commonly known as:  BACTROBAN Place 1 application into the nose 2 (two) times daily.   triamcinolone cream 0.1 % Commonly known as:  KENALOG Apply 1 application topically 2 (two) times daily.       No results found for this or any previous visit (from the past 24 hour(s)). No results found.   ROS: Negative, with the exception of above mentioned in HPI   Objective:  BP 123/77 (BP Location: Right Arm, Patient Position: Sitting, Cuff Size: Normal)   Pulse 65   Temp 97.6 F (36.4 C)   Resp 20   Wt 144 lb 8 oz (65.5 kg)   LMP 12/03/2013   SpO2 99%   BMI 26.43 kg/m  Body mass index is 26.43 kg/m. Gen: Afebrile. No acute distress. Nontoxic in appearance, well developed, well nourished. Well appearing female. HENT: AT. [place].  MMM, no oral lesions.  Eyes:Pupils Equal Round Reactive to light, Extraocular movements intact,  Conjunctiva without redness, discharge or icterus. Neck/lymp/endocrine: Supple, no lymphadenopathy CV: RRR no murmur, no edema Chest: CTAB, no wheeze or crackles. Good air movement, normal resp effort.  Abd: Soft. NTND. BS present. No Masses palpated. No rebound or guarding.  MSK: Mild tenderness to palpation over anterior left rib cage under left breast. Skin: No rashes, purpura or petechiae. No erythema. No bruising. Neuro: Normal gait. PERLA. EOMi. Alert. Oriented x3 Cranial nerves II through XII intact. Muscle strength 5/5 upper/lower extremity. DTRs equal bilaterally. Psych: Talkative, anxious, otherwise Normal affect, dress and demeanor. Normal speech. Normal thought content and judgment. EKG: NSR. Heart rate 64, PR 140, QT 442. No ST changes. No T-wave abnormalities.  Dg Chest 2 View  Result Date: 01/09/2017 CLINICAL DATA:  46 y/o  F; left-sided chest pain. EXAM: CHEST  2 VIEW COMPARISON:  10/26/2016 chest radiograph. FINDINGS: Stable  heart size and mediastinal contours are within normal limits. Both lungs are clear. The visualized skeletal structures are unremarkable. IMPRESSION: No active cardiopulmonary disease. Electronically Signed   By: Mitzi Hansen M.D.   On: 01/09/2017 20:26    Assessment/Plan: Jackie [name] is a 46 y.o. female present for OV for  Chest pain, unspecified type - Discussed with patient possible differential diagnosis of peptic ulcer, pancreatitis, pleurisy/lung related, stress related or least likely cardiac. Patient does have family history of cardiac disease, although this would be extremely atypical. EKG  today is normal without changes compared to prior EKG. Chest x-ray is without musculoskeletal, cardiac or pulmonary causes of discomfort and is unremarkable.  - CMP, lipase and H. pylori collected today. - Patient encouraged to use NSAIDs for comfort. - Patient had declined medications for anxiety or referral for counseling. - EKG 12-Lead - DG Chest 2 View; Future - COMPLETE METABOLIC PANEL WITH GFR - Lipase - H. pylori antibody, IgG - DG Chest 2 View - Follow-up dependent on lab studies.  Reviewed expectations re: course of current medical issues.  Discussed self-management of symptoms.  Outlined signs and symptoms indicating need for more acute intervention.  Patient verbalized understanding and all questions were answered.  Patient received an After-Visit Summary.   Greater than 40 minutes spent with patient, >50% of time spent face to face counseling and/or coordinating care.    electronically signed by:  Felix Pacini, DO  [facility] - [place]

## 2017-01-10 LAB — LIPASE: Lipase: 19 U/L (ref 11.0–59.0)

## 2017-01-11 ENCOUNTER — Telehealth: Payer: Self-pay | Admitting: Family Medicine

## 2017-01-11 LAB — H. PYLORI ANTIBODY, IGG: H Pylori IgG: NEGATIVE

## 2017-01-11 NOTE — Telephone Encounter (Addendum)
Please call pt: - her cxr is normal and  labs are normal.  - I recommend to keep treating with nsaids for now. If not improved follow up 2 weeks.

## 2017-01-11 NOTE — Telephone Encounter (Signed)
Spoke with patient reviewed results and instructions . Patient verbalized understanding. 

## 2017-01-23 ENCOUNTER — Encounter: Payer: Self-pay | Admitting: Family Medicine

## 2017-01-23 ENCOUNTER — Ambulatory Visit (INDEPENDENT_AMBULATORY_CARE_PROVIDER_SITE_OTHER): Payer: 59 | Admitting: Family Medicine

## 2017-01-23 ENCOUNTER — Ambulatory Visit: Payer: 59 | Admitting: Family Medicine

## 2017-01-23 VITALS — BP 104/73 | HR 81 | Temp 98.0°F | Resp 18 | Wt 141.0 lb

## 2017-01-23 DIAGNOSIS — H1033 Unspecified acute conjunctivitis, bilateral: Secondary | ICD-10-CM | POA: Diagnosis not present

## 2017-01-23 MED ORDER — POLYMYXIN B-TRIMETHOPRIM 10000-0.1 UNIT/ML-% OP SOLN: 1.0000 [drp] | Freq: Four times a day (QID) | OPHTHALMIC | 0 refills | Status: AC

## 2017-01-23 NOTE — Progress Notes (Signed)
Jackie [name] , 1970-11-22, 46 y.o., female MRN: [id number] Patient Care Team    Relationship Specialty Notifications Start End  Natalia LeatherwoodKuneff, Renee A, DO PCP - General Family Medicine  05/16/16     Chief Complaint  Patient presents with  . Eye Problem    both eyes swollen ,matted, x 2 days     Subjective: Pt presents for an OV with complaints of Bilateral eye redness, swelling and drainage of 2-3 days duration.  Associated symptoms include eyes were matted shut for the last few days. She denies fever, chills, nausea or vomit. She does endorse fatigue and feels run down. She did try an antihistamine drops for her eyes, which had very minimal improvement.  No flowsheet data found.  Allergies  Allergen Reactions  . Clindamycin/Lincomycin Other (See Comments)    GI upset  . Nizoral [Ketoconazole]     Burning and dry skin   Social History  Substance Use Topics  . Smoking status: Never Smoker  . Smokeless tobacco: Never Used  . Alcohol use Yes     Comment: 1 to 2 glasses 5 times a week. but then the next week maybe nothing   Past Medical History:  Diagnosis Date  . Allergy    seasonal   . Anemia    during pregnancy  . Anemia, unspecified 04/27/2014  . Chicken pox 46 yrs old  . Dysmenorrhea 10/20/2011  . Endometriosis 12/22/2012  . Fibrocystic breast 07/22/2011  . Hair loss 07/03/2014  . Headache(784.0)    otc med prn  . 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 Hyponatremia 12/22/2012  . Low back pain 10/26/2014  . Lymphadenopathy 08/02/2015  . Miliaria   . Missed abortion    resolved on it's own - no surgery required.  . Pleurisy 08/29/2011   History - resolved  . Preventative health care 03/07/2013  . SVD (spontaneous vaginal delivery)    x 1  . Thyroid dysfunction 07/22/2011   history - resolved, no current problem  . Tinea versicolor 03/07/2013  . Vaginitis 07/15/2012  . Vitamin D deficiency 2012   Past Surgical History:  Procedure Laterality Date  .  DENTAL SURGERY  11/2013  . LAPAROSCOPIC SUPRACERVICAL HYSTERECTOMY N/A 12/17/2013   Procedure: LAPAROSCOPIC  SUPRACERVICAL HYSTERECTOMY WITH BILATERAL SALPINGECTOMY;  Surgeon: Oliver PilaKathy W Richardson, MD;  Location: [facility];  Service: Gynecology;  Laterality: N/A;  2hrs OR time   Family History  Problem Relation Age of Onset  . Diabetes Mother     type 2  . Stroke Mother   . Osteoporosis Mother   . Heart disease Mother     angina  . Heart attack Father     MI in his 650s  . Diabetes Father     type 2  . Obesity Sister   . Diabetes Sister   . Obesity Brother   . Diabetes Brother     type 2  . Aneurysm Maternal Grandmother   . Heart attack Maternal Grandfather   . Pneumonia Paternal Grandfather   . Hypertension Sister     induced  . Other Sister     anemic  . Anemia Sister   . Depression Sister    Allergies as of 01/23/2017      Reactions   Clindamycin/lincomycin Other (See Comments)   GI upset   Nizoral [ketoconazole]    Burning and dry skin      Medication List       Accurate as of 01/23/17 11:32 AM.  Always use your most recent med list.          B-complex with vitamin C tablet Take 1 tablet by mouth daily.   FLAX SEED OIL PO Take by mouth.   lactobacillus acidophilus Tabs tablet Take 1 tablet by mouth daily. Reported on 03/30/2016   loratadine 10 MG tablet Commonly known as:  CLARITIN Take 10 mg by mouth daily as needed for allergies.   Magnesium 400 MG Caps Take 400 mg by mouth daily. Reported on 03/30/2016   montelukast 10 MG tablet Commonly known as:  SINGULAIR Take 1 tablet (10 mg total) by mouth at bedtime.   multivitamin tablet Take 1 tablet by mouth daily.   mupirocin ointment 2 % Commonly known as:  BACTROBAN Place 1 application into the nose 2 (two) times daily.   triamcinolone cream 0.1 % Commonly known as:  KENALOG Apply 1 application topically 2 (two) times daily.       All past medical history, surgical history, allergies, family  history, immunizations andmedications were updated in the EMR today and reviewed under the history and medication portions of their EMR.     ROS: Negative, with the exception of above mentioned in HPI   Objective:  BP 104/73 (BP Location: Left Arm, Patient Position: Sitting, Cuff Size: Normal)   Pulse 81   Temp 98 F (36.7 C)   Resp 18   Wt 141 lb (64 kg)   LMP 12/03/2013   SpO2 97%   BMI 25.79 kg/m  Body mass index is 25.79 kg/m. Gen: Afebrile. No acute distress. Nontoxic in appearance, well developed, well nourished.  HENT: AT. [place]. Bilateral TM visualized within normal limits. MMM, no oral lesions. Bilateral nares within normal limits. Throat without erythema or exudates.  Eyes:Pupils Equal Round Reactive to light, Extraocular movements intact,  Conjunctiva with redness and discharge bilaterally, left greater than right. Neck/lymp/endocrine: Supple, mild anterior cervical lymphadenopathy Neuro:  Normal gait. PERLA. EOMi. Alert. Oriented x3  No exam data present No results found. No results found for this or any previous visit (from the past 24 hour(s)).  Assessment/Plan: Jackie [name] is a 46 y.o. female present for OV for  1. Acute conjunctivitis of both eyes, unspecified acute conjunctivitis type - warm compresses a few times a day.  - Polytrim drop 7 days. - continue antihistamine.  - F/u PRN   Reviewed expectations re: course of current medical issues.  Discussed self-management of symptoms.  Outlined signs and symptoms indicating need for more acute intervention.  Patient verbalized understanding and all questions were answered.  Patient received an After-Visit Summary.   Note is dictated utilizing voice recognition software. Although note has been proof read prior to signing, occasional typographical errors still can be missed. If any questions arise, please do not hesitate to call for verification.   electronically signed by:  Felix Pacini, DO  [facility] [place]

## 2017-01-23 NOTE — Patient Instructions (Signed)
Viral Conjunctivitis, Adult Viral conjunctivitis is an inflammation of the clear membrane that covers the white part of your eye and the inner surface of your eyelid (conjunctiva). The inflammation is caused by a viral infection. The blood vessels in the conjunctiva become inflamed, causing the eye to become red or pink, and often itchy. Viral conjunctivitis can be easily passed from one person to another (is contagious). This condition is often called pink eye. What are the causes? This condition is caused by a virus. A virus is a type of contagious germ. It can be spread by touching objects that have been contaminated with the virus, such as doorknobs or towels. It can also be passed through droplets, such as from coughing or sneezing. What are the signs or symptoms? Symptoms of this condition include:  Eye redness.  Tearing or watery eyes.  Itchy and irritated eyes.  Burning feeling in the eyes.  Clear drainage from the eye.  Swollen eyelids.  A gritty feeling in the eye.  Light sensitivity. This condition often occurs with other symptoms, such as a fever, nausea, or a rash. How is this diagnosed? This condition is diagnosed with a medical history and physical exam. If you have discharge from your eye, the discharge may be tested to rule out other causes of conjunctivitis. How is this treated? Viral conjunctivitis does not respond to medicines that kill bacteria (antibiotics). Treatment for viral conjunctivitis is directed at stopping a bacterial infection from developing in addition to the viral infection. Treatment also aims to relieve your symptoms, such as itching. This may be done with antihistamine drops or other eye medicines. Rarely, steroid eye drops or antiviral medicines may be prescribed. Follow these instructions at home: Medicines    Take or apply over-the-counter and prescription medicines only as told by your health care provider.  Be very careful to avoid touching  the edge of the eyelid with the eye drop bottle or ointment tube when applying medicines to the affected eye. Being careful this way will stop you from spreading the infection to the other eye or to other people. Eye care   Avoid touching or rubbing your eyes.  Apply a warm, wet, clean washcloth to your eye for 10-20 minutes, 3-4 times per day or as told by your health care provider.  If you wear contact lenses, do not wear them until the inflammation is gone and your health care provider says it is safe to wear them again. Ask your health care provider how to sterilize or replace your contact lenses before using them again. Wear glasses until you can resume wearing contacts.  Avoid wearing eye makeup until the inflammation is gone. Throw away any old eye cosmetics that may be contaminated.  Gently wipe away any drainage from your eye with a warm, wet washcloth or a cotton ball. General instructions   Change or wash your pillowcase every day or as told by your health care provider.  Do not share towels, pillowcases, washcloths, eye makeup, makeup brushes, contact lenses, or glasses. This may spread the infection.  Wash your hands often with soap and water. Use paper towels to dry your hands. If soap and water are not available, use hand sanitizer.  Try to avoid contact with other people for one week or as told by your health care provider. Contact a health care provider if:  Your symptoms do not improve with treatment or they get worse.  You have increased pain.  Your vision becomes blurry.  You   have a fever.  You have facial pain, redness, or swelling.  You have yellow or green drainage coming from your eye.  You have new symptoms. This information is not intended to replace advice given to you by your health care provider. Make sure you discuss any questions you have with your health care provider. Document Released: 11/26/2002 Document Revised: 04/02/2016 Document Reviewed:  03/22/2016 Elsevier Interactive Patient Education  2017 [employer]    Bacterial Conjunctivitis Bacterial conjunctivitis is an infection of your conjunctiva. This is the clear membrane that covers the white part of your eye and the inner surface of your eyelid. This condition can make your eye:  Red or pink.  Itchy. This condition is caused by bacteria. This condition spreads very easily from person to person (is contagious) and from one eye to the other eye. Follow these instructions at home: Medicines   Take or apply your antibiotic medicine as told by your doctor. Do not stop taking or applying the antibiotic even if you start to feel better.  Take or apply over-the-counter and prescription medicines only as told by your doctor.  Do not touch your eyelid with the eye drop bottle or the ointment tube. Managing discomfort   Wipe any fluid from your eye with a warm, wet washcloth or a cotton ball.  Place a cool, clean washcloth on your eye. Do this for 10-20 minutes, 3-4 times per day. General instructions   Do not wear contact lenses until the irritation is gone. Wear glasses until your doctor says it is okay to wear contacts.  Do not wear eye makeup until your symptoms are gone. Throw away any old makeup.  Change or wash your pillowcase every day.  Do not share towels or washcloths with anyone.  Wash your hands often with soap and water. Use paper towels to dry your hands.  Do not touch or rub your eyes.  Do not drive or use heavy machinery if your vision is blurry. Contact a doctor if:  You have a fever.  Your symptoms do not get better after 10 days. Get help right away if:  You have a fever and your symptoms suddenly get worse.  You have very bad pain when you move your eye.  Your face:  Hurts.  Is red.  Is swollen.  You have sudden loss of vision. This information is not intended to replace advice given to you by your health care provider. Make  sure you discuss any questions you have with your health care provider. Document Released: 06/14/2008 Document Revised: 02/11/2016 Document Reviewed: 06/18/2015 Elsevier Interactive Patient Education  2017 [employer]

## 2017-02-07 ENCOUNTER — Encounter: Payer: Self-pay | Admitting: Family Medicine

## 2017-02-07 ENCOUNTER — Ambulatory Visit (HOSPITAL_BASED_OUTPATIENT_CLINIC_OR_DEPARTMENT_OTHER)
Admission: RE | Admit: 2017-02-07 | Discharge: 2017-02-07 | Disposition: A | Discharge status: Home / Self Care | Payer: 59 | Source: Ambulatory Visit | Attending: Family Medicine | Admitting: Family Medicine

## 2017-02-07 ENCOUNTER — Ambulatory Visit (INDEPENDENT_AMBULATORY_CARE_PROVIDER_SITE_OTHER): Payer: 59 | Admitting: Family Medicine

## 2017-02-07 VITALS — BP 115/74 | HR 68 | Temp 97.8°F | Resp 18 | Wt 140.0 lb

## 2017-02-07 DIAGNOSIS — Z639 Problem related to primary support group, unspecified: Secondary | ICD-10-CM

## 2017-02-07 DIAGNOSIS — M47812 Spondylosis without myelopathy or radiculopathy, cervical region: Secondary | ICD-10-CM | POA: Insufficient documentation

## 2017-02-07 DIAGNOSIS — M542 Cervicalgia: Secondary | ICD-10-CM | POA: Insufficient documentation

## 2017-02-07 DIAGNOSIS — T148XXA Other injury of unspecified body region, initial encounter: Secondary | ICD-10-CM | POA: Diagnosis not present

## 2017-02-07 DIAGNOSIS — X58XXXA Exposure to other specified factors, initial encounter: Secondary | ICD-10-CM | POA: Diagnosis not present

## 2017-02-07 DIAGNOSIS — M79602 Pain in left arm: Secondary | ICD-10-CM | POA: Insufficient documentation

## 2017-02-07 DIAGNOSIS — F419 Anxiety disorder, unspecified: Secondary | ICD-10-CM | POA: Diagnosis not present

## 2017-02-07 MED ORDER — BACLOFEN 10 MG PO TABS: 10.0000 mg | ORAL_TABLET | Freq: Three times a day (TID) | ORAL | 2 refills | Status: DC

## 2017-02-07 NOTE — Patient Instructions (Signed)
Start muscle relaxer at least every night. Can use during day if not sedating to you.  Neck xray, at [facility].   If normal xray, will place referral to physical therapy.    I will place a referral to Dr. Dewayne Hatch. Check your insurance coverage for this.   Please help Jackie [name] help you:  We are honored you have chosen [name] for your Primary Care home. Below you will find basic instructions that you may need to access in the future. Please help Jackie [name] help you by reading the instructions, which cover many of the frequent questions we experience.   Prescription refills and request:  -In order to allow more efficient response time, please call your pharmacy for all refills. They will forward the request electronically to Jackie [name]. This allows for the quickest possible response. Request left on a nurse line can take longer to refill, since these are checked as time allows between office patients and other phone calls.  - refill request can take up to 3-5 working days to complete.  - If request is sent electronically and request is appropiate, it is usually completed in 1-2 business days.  - all patients will need to be seen routinely for all chronic medical conditions requiring prescription medications (see follow-up below). If you are overdue for follow up on your condition, you will be asked to make an appointment and we will call in enough medication to cover you until your appointment (up to 30 days).  - all controlled substances will require a face to face visit to request/refill.  - if you desire your prescriptions to go through a new pharmacy, and have an active script at original pharmacy, you will need to call your pharmacy and have scripts transferred to new pharmacy. This is completed between the pharmacy locations and not by your provider.    Results: If any images or labs were ordered, it can take up to 1 week to get results depending on the test ordered and the lab/facility running  and resulting the test. - Normal or stable results, which do not need further discussion, may be released to your mychart immediately with attached note to you. A call may not be generated for normal results. Please make certain to sign up for mychart. If you have questions on how to activate your mychart you can call the front office.  - If your results need further discussion, our office will attempt to contact you via phone, and if unable to reach you after 2 attempts, we will release your abnormal result to your mychart with instructions.  - All results will be automatically released in mychart after 1 week.  - Your provider will provide you with explanation and instruction on all relevant material in your results. Please keep in mind, results and labs may appear confusing or abnormal to the untrained eye, but it does not mean they are actually abnormal for you personally. If you have any questions about your results that are not covered, or you desire more detailed explanation than what was provided, you should make an appointment with your provider to do so.   Our office handles many outgoing and incoming calls daily. If we have not contacted you within 1 week about your results, please check your mychart to see if there is a message first and if not, then contact our office.  In helping with this matter, you help decrease call volume, and therefore allow Jackie [name] to be able to respond to  patients needs more efficiently.   Acute office visits (sick visit):  An acute visit is intended for a new problem and are scheduled in shorter time slots to allow schedule openings for patients with new problems. This is the appropriate visit to discuss a new problem. In order to provide you with excellent quality medical care with proper time for you to explain your problem, have an exam and receive treatment with instructions, these appointments should be limited to one new problem per visit. If you experience a new  problem, in which you desire to be addressed, please make an acute office visit, we save openings on the schedule to accommodate you. Please do not save your new problem for any other type of visit, let us take care of it properly and quickly for you.   Follow up visits:  Depending on your condition(s) your provider will need to see you routinely in order to provide you with quality care and prescribe medication(s). Most chronic conditions (Example: hypertension, Diabetes, depression/anxiety... etc), require visits a couple times a year. Your provider will instruct you on proper follow up for your personal medical conditions and history. Please make certain to make follow up appointments for your condition as instructed. Failing to do so could result in lapse in your medication treatment/refills. If you request a refill, and are overdue to be seen on a condition, we will always provide you with a 30 day script (once) to allow you time to schedule.    Medicare wellness (well visit): - we have a wonderful Nurse Selena Batten(Kim), that will meet with you and provide you will yearly medicare wellness visits. These visits should occur yearly (can not be scheduled less than 1 calendar year apart) and cover preventive health, immunizations, advance directives and screenings you are entitled to yearly through your medicare benefits. Do not miss out on your entitled benefits, this is when medicare will pay for these benefits to be ordered for you.  These are strongly encouraged by your provider and is the appropriate type of visit to make certain you are up to date with all preventive health benefits. If you have not had your medicare wellness exam in the last 12 months, please make certain to schedule one by calling the office and schedule your medicare wellness with Selena BattenKim as soon as possible.   Yearly physical (well visit):  - Adults are recommended to be seen yearly for physicals. Check with your insurance and date of your  last physical, most insurances require one calendar year between physicals. Physicals include all preventive health topics, screenings, medical exam and labs that are appropriate for gender/age and history. You may have fasting labs needed at this visit. This is a well visit (not a sick visit), new problems should not be covered during this visit (see acute visit).  - Pediatric patients are seen more frequently when they are younger. Your provider will advise you on well child visit timing that is appropriate for your their age. - This is not a medicare wellness visit. Medicare wellness exams do not have an exam portion to the visit. Some medicare companies allow for a physical, some do not allow a yearly physical. If your medicare allows a yearly physical you can schedule the medicare wellness with our nurse Selena BattenKim and have your physical with your provider after, on the same day. Please check with insurance for your full benefits.   Late Policy/No Shows:  - all new patients should arrive 15-30 minutes earlier than appointment  to allow Jackie [name] time  to  obtain all personal demographics,  insurance information and for you to complete office paperwork. - All established patients should arrive 10-15 minutes earlier than appointment time to update all information and be checked in .  - In our best efforts to run on time, if you are late for your appointment you will be asked to either reschedule or if able, we will work you back into the schedule. There will be a wait time to work you back in the schedule,  depending on availability.  - If you are unable to make it to your appointment as scheduled, please call 24 hours ahead of time to allow Jackie [name] to fill the time slot with someone else who needs to be seen. If you do not cancel your appointment ahead of time, you may be charged a no show fee.
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
## 2017-02-08 ENCOUNTER — Telehealth: Payer: Self-pay | Admitting: Family Medicine

## 2017-02-08 DIAGNOSIS — M542 Cervicalgia: Secondary | ICD-10-CM

## 2017-02-08 DIAGNOSIS — M79602 Pain in left arm: Secondary | ICD-10-CM

## 2017-02-08 NOTE — Telephone Encounter (Signed)
Please call pt: - Her xray did show evidence of bone spurring (which can be arthritic like changes) in two areas of her neck that are consistent with possible cause of her shoulder discomfort. I would like her to continue the muscle relaxer I prescribed and Would like her to try PT.  - Start Aleve every 12 hours for 5-7 days, with food.  - If not improving or worsening with PT, then would want to consider referral or further imaging.  PT referral placed.

## 2017-02-08 NOTE — Telephone Encounter (Signed)
Left message for patient to return call to review results. 

## 2017-02-09 NOTE — Telephone Encounter (Signed)
Pt returned call. I gave her the results and advised that [facility] and give her the insurance benefits. States that she has had heaviness in her arm at night and two days ago she had severe pain but did take her muscle relaxer. Also advised her that Dr Claiborne BillingsKuneff advised that she begin Aleve for the next 5-7 days.

## 2017-02-09 NOTE — Telephone Encounter (Signed)
noted 

## 2017-02-14 ENCOUNTER — Ambulatory Visit (INDEPENDENT_AMBULATORY_CARE_PROVIDER_SITE_OTHER): Payer: 59 | Admitting: Physical Therapy

## 2017-02-14 DIAGNOSIS — M542 Cervicalgia: Secondary | ICD-10-CM | POA: Diagnosis not present

## 2017-02-14 DIAGNOSIS — R293 Abnormal posture: Secondary | ICD-10-CM | POA: Diagnosis not present

## 2017-02-14 DIAGNOSIS — M6281 Muscle weakness (generalized): Secondary | ICD-10-CM
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
## 2017-02-20 ENCOUNTER — Ambulatory Visit (INDEPENDENT_AMBULATORY_CARE_PROVIDER_SITE_OTHER): Payer: 59 | Admitting: Physical Therapy

## 2017-02-20 DIAGNOSIS — M6281 Muscle weakness (generalized): Secondary | ICD-10-CM | POA: Diagnosis not present

## 2017-02-20 DIAGNOSIS — M542 Cervicalgia: Secondary | ICD-10-CM

## 2017-02-20 DIAGNOSIS — R293 Abnormal posture: Secondary | ICD-10-CM

## 2017-02-20 NOTE — Therapy (Signed)
[facility] [address] [place], [place], [address] Phone: [phone]   Fax:  [phone]  Physical Therapy Treatment  Patient Details  Name: Jackie [name] MRN: [id number] Date of Birth: [birth year] Referring Provider: Natalia Leatherwood, DO  Encounter Date: 02/20/2017      PT End of Session - 02/20/17 0927    Visit Number 2   Number of Visits 6   Date for PT Re-Evaluation 03/28/17   Authorization Type UHC    PT Start Time 0845   PT Stop Time 0940   PT Time Calculation (min) 55 min   Activity Tolerance Patient tolerated treatment well   Behavior During Therapy [facility] for tasks assessed/performed      Past Medical History:  Diagnosis Date  . Allergy    seasonal   . Anemia    during pregnancy  . Anemia, unspecified 04/27/2014  . Chicken pox 46 yrs old  . Dysmenorrhea 10/20/2011  . Endometriosis 12/22/2012  . Fibrocystic breast 07/22/2011  . Hair loss 07/03/2014  . Headache(784.0)    otc med prn  . 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 Hyponatremia 12/22/2012  . Low back pain 10/26/2014  . Lymphadenopathy 08/02/2015  . Miliaria   . Missed abortion    resolved on it's own - no surgery required.  . Pleurisy 08/29/2011   History - resolved  . Preventative health care 03/07/2013  . SVD (spontaneous vaginal delivery)    x 1  . Thyroid dysfunction 07/22/2011   history - resolved, no current problem  . Tinea versicolor 03/07/2013  . Vaginitis 07/15/2012  . Vitamin D deficiency 2012    Past Surgical History:  Procedure Laterality Date  . DENTAL SURGERY  11/2013  . LAPAROSCOPIC SUPRACERVICAL HYSTERECTOMY N/A 12/17/2013   Procedure: LAPAROSCOPIC  SUPRACERVICAL HYSTERECTOMY WITH BILATERAL SALPINGECTOMY;  Surgeon: Oliver Pila, MD;  Location: [facility];  Service: Gynecology;  Laterality: N/A;  2hrs OR time    There were no vitals filed for this visit.      Subjective Assessment - 02/20/17 0847    Subjective doing  well; taking it day by day.  having difficulty with grip and pain into LUE, feels like it may be coming from elbow.   Patient Stated Goals improve pain   Currently in Pain? Yes   Pain Score 7    Pain Location Shoulder   Pain Orientation Left   Pain Descriptors / Indicators Discomfort   Pain Type Acute pain   Pain Radiating Towards LUE   Pain Onset 1 to 4 weeks ago   Pain Frequency Constant   Aggravating Factors  driving, stress   Pain Relieving Factors heat/ice                         [facility] Adult PT Treatment/Exercise - 02/20/17 0922      Self-Care   Self-Care Other Self-Care Comments   Other Self-Care Comments  reviewed therapy ball for myofascial release, pt brought her ball to PT today so instructed in how to properly use.     Modalities   Modalities Moist Heat;Electrical Stimulation     Moist Heat Therapy   Number Minutes Moist Heat 15 Minutes   Moist Heat Location Wrist;Elbow;Shoulder     Electrical Stimulation   Electrical Stimulation Location Lt shoulder   Electrical Stimulation Action IFC   Electrical Stimulation Parameters to tolerance x 15 min   Electrical Stimulation Goals Pain;Tone  Manual Therapy   Manual Therapy Myofascial release   Manual therapy comments pt supine   Myofascial Release LUE including UT, cervical paraspinals, levator scapula, biceps and wrist extensors.                       PT Long Term Goals - 02/14/17 1252      PT LONG TERM GOAL #1   Title independent with HEP (03/28/17)   Time 6   Period Weeks   Status New     PT LONG TERM GOAL #2   Title perform neck and LUE AROM without increase in pain for improved function (03/28/17)   Time 6   Period Weeks   Status New     PT LONG TERM GOAL #3   Title report 75% improvement in LUE pain for improved functional use (03/28/17)   Time 6   Period Weeks   Status New     PT LONG TERM GOAL #4   Title verbalize understanding of posture/body mechanics to decrease  risk of reinjury (03/28/17)   Time 6   Period Weeks   Status New               Plan - 02/20/17 [id number]    Clinical Impression Statement Pt reports pain down to 3/10 following manual therapy and modalities.  Feel stress is highly contributory to pain at this time, and pt agrees.  Pt with active trigger points in UT and levator as well as biceps so may benefit from TDN to help.  Will continue to benefit from PT to maximize function.   PT Treatment/Interventions ADLs/Self Care Home Management;Cryotherapy;Electrical Stimulation;Moist Heat;Ultrasound;Traction;Therapeutic exercise;Therapeutic activities;Patient/family education;Dry needling;Taping;Manual techniques;Passive range of motion   PT Next Visit Plan manual/modalities PRN for pain; establish HEP for stretching/posture/strengthening, TDN as indicated   Consulted and Agree with Plan of Care Patient      Patient will benefit from skilled therapeutic intervention in order to improve the following deficits and impairments:  Postural dysfunction, Increased fascial restricitons, Pain, Impaired UE functional use  Visit Diagnosis: Cervicalgia  Abnormal posture  Muscle weakness (generalized)     Problem List Patient Active Problem List   Diagnosis Date Noted  . Pain in left arm 02/07/2017  . Family distress 10/26/2016  . Lymphadenopathy 08/02/2015  . 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 Allergic rhinitis 05/10/2013  . Endometriosis 12/22/2012  . Eczema 12/22/2011  . Anxiety disorder 12/22/2011  . Ovarian cyst 10/20/2011  . Chest pain, atypical 08/24/2011  . Palpitations 08/05/2011  . Neck pain 08/03/2011  . Vitamin D deficiency 07/22/2011  . Thyroid dysfunction 07/22/2011     Clarita CraneStephanie F Pilar Corrales, PT, DPT 02/20/17 9:42 AM    [facility] [address] [place], [place], [address] Phone: [phone]   Fax:  [phone]  Name: Kendall FlackKausar Jacot MRN:  [id number] Date of Birth: 1971/06/01

## 2017-02-22 ENCOUNTER — Ambulatory Visit (INDEPENDENT_AMBULATORY_CARE_PROVIDER_SITE_OTHER): Payer: 59 | Admitting: Physical Therapy

## 2017-02-22 DIAGNOSIS — M6281 Muscle weakness (generalized): Secondary | ICD-10-CM

## 2017-02-22 DIAGNOSIS — R293 Abnormal posture: Secondary | ICD-10-CM

## 2017-02-22 DIAGNOSIS — M542 Cervicalgia: Secondary | ICD-10-CM

## 2017-02-22 NOTE — Therapy (Signed)
[facility] [address] [place], [place], [address] Phone: [phone]   Fax:  [phone]  Physical Therapy Treatment  Patient Details  Name: Jackie [name] MRN: [id number] Date of Birth: 07-04-1971 Referring Provider: Natalia Leatherwood, DO  Encounter Date: 02/22/2017      PT End of Session - 02/22/17 1001    Visit Number 3   Number of Visits 6   Date for PT Re-Evaluation 03/28/17   Authorization Type UHC    PT Start Time 0848   PT Stop Time 0945   PT Time Calculation (min) 57 min   Activity Tolerance Patient tolerated treatment well   Behavior During Therapy [facility] for tasks assessed/performed      Past Medical History:  Diagnosis Date  . Allergy    seasonal   . Anemia    during pregnancy  . Anemia, unspecified 04/27/2014  . Chicken pox 46 yrs old  . Dysmenorrhea 10/20/2011  . Endometriosis 12/22/2012  . Fibrocystic breast 07/22/2011  . Hair loss 07/03/2014  . Headache(784.0)    otc med prn  . 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 Hyponatremia 12/22/2012  . Low back pain 10/26/2014  . Lymphadenopathy 08/02/2015  . Miliaria   . Missed abortion    resolved on it's own - no surgery required.  . Pleurisy 08/29/2011   History - resolved  . Preventative health care 03/07/2013  . SVD (spontaneous vaginal delivery)    x 1  . Thyroid dysfunction 07/22/2011   history - resolved, no current problem  . Tinea versicolor 03/07/2013  . Vaginitis 07/15/2012  . Vitamin D deficiency 2012    Past Surgical History:  Procedure Laterality Date  . DENTAL SURGERY  11/2013  . LAPAROSCOPIC SUPRACERVICAL HYSTERECTOMY N/A 12/17/2013   Procedure: LAPAROSCOPIC  SUPRACERVICAL HYSTERECTOMY WITH BILATERAL SALPINGECTOMY;  Surgeon: Oliver Pila, MD;  Location: [facility];  Service: Gynecology;  Laterality: N/A;  2hrs OR time    There were no vitals filed for this visit.      Subjective Assessment - 02/22/17 0852    Subjective felt  very tired and tight after last session.                           [facility] Adult PT Treatment/Exercise - 02/22/17 0928      Exercises   Exercises Wrist;Elbow;Neck     Elbow Exercises   Elbow Extension Limitations doorway biceps stretch 2x30 sec   Wrist Flexion Limitations prayer stretch 2x30 sec   Wrist Extension Limitations stretch 2x30 sec     Moist Heat Therapy   Number Minutes Moist Heat 15 Minutes   Moist Heat Location Wrist;Elbow;Shoulder     Electrical Stimulation   Electrical Stimulation Location Lt shoulder   Electrical Stimulation Action IFC to tolerance x 15 min   Electrical Stimulation Goals Pain;Tone     Manual Therapy   Myofascial Release LUE including UT, cervical paraspinals, levator scapula, biceps and wrist extensors.       Neck Exercises: Stretches   Upper Trapezius Stretch 2 reps;30 seconds          Trigger Point Dry Needling - 02/22/17 1000    Consent Given? Yes   Education Handout Provided Yes   Muscles Treated Upper Body Upper trapezius   Upper Trapezius Response Twitch reponse elicited;Palpable increased muscle length              PT Education - 02/22/17 1000  Education provided Yes   Education Details TDN, HEP for stretching   Person(s) Educated Patient   Methods Explanation;Demonstration;Handout   Comprehension Verbalized understanding;Returned demonstration             PT Long Term Goals - 02/14/17 1252      PT LONG TERM GOAL #1   Title independent with HEP (03/28/17)   Time 6   Period Weeks   Status New     PT LONG TERM GOAL #2   Title perform neck and LUE AROM without increase in pain for improved function (03/28/17)   Time 6   Period Weeks   Status New     PT LONG TERM GOAL #3   Title report 75% improvement in LUE pain for improved functional use (03/28/17)   Time 6   Period Weeks   Status New     PT LONG TERM GOAL #4   Title verbalize understanding of posture/body mechanics to decrease risk  of reinjury (03/28/17)   Time 6   Period Weeks   Status New               Plan - 02/22/17 1001    Clinical Impression Statement Decreased tightness noted following TDN today with twitch response noted during.  Instructed in LUE stretching today.  Pt continues to have some pain in Lt elbow which she reports has increased.  Recommended pt follow up with MD if not improved.  Will continue to benefit from PT to decrease pain and improve function.   PT Frequency 1x / week  seen 2x this week for HEP and due to increase pain Monday   PT Duration 6 weeks   PT Treatment/Interventions ADLs/Self Care Home Management;Cryotherapy;Electrical Stimulation;Moist Heat;Ultrasound;Traction;Therapeutic exercise;Therapeutic activities;Patient/family education;Dry needling;Taping;Manual techniques;Passive range of motion   PT Next Visit Plan manual/modalities PRN for pain; establish HEP for stretching/posture/strengthening, TDN as indicated   Consulted and Agree with Plan of Care Patient      Patient will benefit from skilled therapeutic intervention in order to improve the following deficits and impairments:  Postural dysfunction, Increased fascial restricitons, Pain, Impaired UE functional use  Visit Diagnosis: Cervicalgia  Abnormal posture  Muscle weakness (generalized)     Problem List Patient Active Problem List   Diagnosis Date Noted  . Pain in left arm 02/07/2017  . Family distress 10/26/2016  . Lymphadenopathy 08/02/2015  . 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 Allergic rhinitis 05/10/2013  . Endometriosis 12/22/2012  . Eczema 12/22/2011  . Anxiety disorder 12/22/2011  . Ovarian cyst 10/20/2011  . Chest pain, atypical 08/24/2011  . Palpitations 08/05/2011  . Neck pain 08/03/2011  . Vitamin D deficiency 07/22/2011  . Thyroid dysfunction 07/22/2011     Clarita CraneStephanie F Lyle Niblett, PT, DPT 02/22/17 10:13 AM    [facility] [address] [place], [place], [address] Phone: [phone]   Fax:  [phone]  Name: Jackie [name] MRN: [id number] Date of Birth: 1971-01-05

## 2017-02-22 NOTE — Patient Instructions (Addendum)
Trigger Point Dry Needling  . What is Trigger Point Dry Needling (DN)? o DN is a physical therapy technique used to treat muscle pain and dysfunction. Specifically, DN helps deactivate muscle trigger points (muscle knots).  o A thin filiform needle is used to penetrate the skin and stimulate the underlying trigger point. The goal is for a local twitch response (LTR) to occur and for the trigger point to relax. No medication of any kind is injected during the procedure.   . What Does Trigger Point Dry Needling Feel Like?  o The procedure feels different for each individual patient. Some patients report that they do not actually feel the needle enter the skin and overall the process is not painful. Very mild bleeding may occur. However, many patients feel a deep cramping in the muscle in which the needle was inserted. This is the local twitch response.   Marland Kitchen. How Will I feel after the treatment? o Soreness is normal, and the onset of soreness may not occur for a few hours. Typically this soreness does not last longer than two days.  o Bruising is uncommon, however; ice can be used to decrease any possible bruising.  o In rare cases feeling tired or nauseous after the treatment is normal. In addition, your symptoms may get worse before they get better, this period will typically not last longer than 24 hours.   . What Can I do After My Treatment? o Increase your hydration by drinking more water for the next 24 hours. o You may place ice or heat on the areas treated that have become sore, however, do not use heat on inflamed or bruised areas. Heat often brings more relief post needling. o You can continue your regular activities, but vigorous activity is not recommended initially after the treatment for 24 hours. o DN is best combined with other physical therapy such as strengthening, stretching, and other therapies.    Composite Extension (Passive Flexor Stretch)    Sitting with elbows on table  and palms together, slowly lower wrists toward table until stretch is felt. Be sure to keep palms together throughout stretch. Hold _30___ seconds. Relax. Repeat __2-3__ times. Do _2-3___ sessions per day.  Copyright  VHI. All rights reserved.    Wrist Extensor Stretch    Keeping elbow straight, grasp left hand and slowly bend wrist forward until stretch is felt. Hold __30__ seconds. Relax. Repeat __2-3__ times per set. Do _1___ sets per session. Do __2-3__ sessions per day.  Copyright  VHI. All rights reserved.            ELBOW: Biceps - Standing    Standing in doorway, place one hand on wall, elbow straight. Lean forward. Hold _30__ seconds. _2-3__ reps per set, __2-3_ sets per day.  Copyright  VHI. All rights reserved.   Flexibility: Upper Trapezius Stretch    Gently grasp right side of head while reaching behind back with other hand. Tilt head away until a gentle stretch is felt. Hold __30__ seconds. Repeat _2-3___ times per set. Do __1__ sets per session. Do __2-3__ sessions per day.  http://orth.exer.us/341   Copyright  VHI. All rights reserved.

## 2017-02-27 ENCOUNTER — Ambulatory Visit (INDEPENDENT_AMBULATORY_CARE_PROVIDER_SITE_OTHER): Payer: 59 | Admitting: Physical Therapy

## 2017-02-27 DIAGNOSIS — R293 Abnormal posture: Secondary | ICD-10-CM

## 2017-02-27 DIAGNOSIS — M6281 Muscle weakness (generalized): Secondary | ICD-10-CM

## 2017-02-27 DIAGNOSIS — M542 Cervicalgia: Secondary | ICD-10-CM

## 2017-02-27 NOTE — Therapy (Signed)
[facility] [address] [place], [place], [address] Phone: [phone]   Fax:  [phone]  Physical Therapy Treatment  Patient Details  Name: Jackie [name] MRN: [id number] Date of Birth: 12/19/1970 Referring Provider: Natalia Leatherwood, DO  Encounter Date: 02/27/2017      PT End of Session - 02/27/17 1157    Visit Number 4   Number of Visits 6   Date for PT Re-Evaluation 03/28/17   Authorization Type UHC    PT Start Time 0935   PT Stop Time 1030   PT Time Calculation (min) 55 min   Activity Tolerance Patient tolerated treatment well   Behavior During Therapy [facility] for tasks assessed/performed      Past Medical History:  Diagnosis Date  . Allergy    seasonal   . Anemia    during pregnancy  . Anemia, unspecified 04/27/2014  . Chicken pox 46 yrs old  . Dysmenorrhea 10/20/2011  . Endometriosis 12/22/2012  . Fibrocystic breast 07/22/2011  . Hair loss 07/03/2014  . Headache(784.0)    otc med prn  . 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 Hyponatremia 12/22/2012  . Low back pain 10/26/2014  . Lymphadenopathy 08/02/2015  . Miliaria   . Missed abortion    resolved on it's own - no surgery required.  . Pleurisy 08/29/2011   History - resolved  . Preventative health care 03/07/2013  . SVD (spontaneous vaginal delivery)    x 1  . Thyroid dysfunction 07/22/2011   history - resolved, no current problem  . Tinea versicolor 03/07/2013  . Vaginitis 07/15/2012  . Vitamin D deficiency 2012    Past Surgical History:  Procedure Laterality Date  . DENTAL SURGERY  11/2013  . LAPAROSCOPIC SUPRACERVICAL HYSTERECTOMY N/A 12/17/2013   Procedure: LAPAROSCOPIC  SUPRACERVICAL HYSTERECTOMY WITH BILATERAL SALPINGECTOMY;  Surgeon: Jackie Pila, MD;  Location: [facility];  Service: Gynecology;  Laterality: N/A;  2hrs OR time    There were no vitals filed for this visit.      Subjective Assessment - 02/27/17 0936    Subjective sore  after TDN as expected.  had to do some yardwork this weekend and arms were shaking afterwards due to fatigue.     Patient Stated Goals improve pain   Currently in Pain? Yes   Pain Location Shoulder   Pain Orientation Left   Pain Descriptors / Indicators Discomfort   Pain Type Acute pain   Pain Radiating Towards LUE   Pain Onset 1 to 4 weeks ago   Pain Frequency Constant   Aggravating Factors  driving, stress   Pain Relieving Factors heat/ice                         [facility] Adult PT Treatment/Exercise - 02/27/17 1024      Elbow Exercises   Wrist Flexion Limitations prayer stretch x30 sec   Wrist Extension Limitations stretch 3x30 sec; reinforced need to perform consistently at home     Modalities   Modalities Moist Heat;Electrical Stimulation     Moist Heat Therapy   Number Minutes Moist Heat 15 Minutes   Moist Heat Location Wrist;Elbow;Shoulder     Electrical Stimulation   Electrical Stimulation Location Lt shoulder   Electrical Stimulation Action IFC to tolerance x 15 min   Electrical Stimulation Goals Pain;Tone     Manual Therapy   Manual Therapy Soft tissue mobilization;Myofascial release   Manual therapy comments pt supine  Soft tissue mobilization LUE including UT, cervical paraspinals, levator scapula, biceps and wrist extensors.     Myofascial Release LUE including UT, cervical paraspinals, levator scapula, biceps and wrist extensors.       Neck Exercises: Stretches   Upper Trapezius Stretch 2 reps;30 seconds          Trigger Point Dry Needling - 02/27/17 1023    Consent Given? Yes   Muscles Treated Upper Body Upper trapezius  wrist extensors; brachioradialis   Upper Trapezius Response Twitch reponse elicited;Palpable increased muscle length  same response with wrist extensors and brachioradialis                   PT Long Term Goals - 02/14/17 1252      PT LONG TERM GOAL #1   Title independent with HEP (03/28/17)   Time 6    Period Weeks   Status New     PT LONG TERM GOAL #2   Title perform neck and LUE AROM without increase in pain for improved function (03/28/17)   Time 6   Period Weeks   Status New     PT LONG TERM GOAL #3   Title report 75% improvement in LUE pain for improved functional use (03/28/17)   Time 6   Period Weeks   Status New     PT LONG TERM GOAL #4   Title verbalize understanding of posture/body mechanics to decrease risk of reinjury (03/28/17)   Time 6   Period Weeks   Status New               Plan - 02/27/17 1159    Clinical Impression Statement Pt reports continued pain but overall seems to be increasing LUE use and improving function.  Pt continues to trigger points throughout LUE, so performed TDN to upper trap as well as brachioradialis and wrist extensors today.  Good response to all areas for TDN with twitch response to all muscle groups.  Will continue to benefit from PT to improve pain and UE use.   PT Treatment/Interventions ADLs/Self Care Home Management;Cryotherapy;Electrical Stimulation;Moist Heat;Ultrasound;Traction;Therapeutic exercise;Therapeutic activities;Patient/family education;Dry needling;Taping;Manual techniques;Passive range of motion   PT Next Visit Plan manual/modalities PRN for pain; establish HEP for stretching/posture/strengthening, TDN as indicated   Consulted and Agree with Plan of Care Patient      Patient will benefit from skilled therapeutic intervention in order to improve the following deficits and impairments:  Postural dysfunction, Increased fascial restricitons, Pain, Impaired UE functional use  Visit Diagnosis: Cervicalgia  Abnormal posture  Muscle weakness (generalized)     Problem List Patient Active Problem List   Diagnosis Date Noted  . Pain in left arm 02/07/2017  . Family distress 10/26/2016  . Lymphadenopathy 08/02/2015  . 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 Allergic rhinitis 05/10/2013  .  Endometriosis 12/22/2012  . Eczema 12/22/2011  . Anxiety disorder 12/22/2011  . Ovarian cyst 10/20/2011  . Chest pain, atypical 08/24/2011  . Palpitations 08/05/2011  . Neck pain 08/03/2011  . Vitamin D deficiency 07/22/2011  . Thyroid dysfunction 07/22/2011      Jackie [name] Justine Dines, PT, DPT 02/27/17 12:04 PM    [place] [address] [place], [place], [address] Phone: [phone]   Fax:  [phone]  Name: Jackie [name] MRN: [id number] Date of Birth: 1971-03-04

## 2017-03-06 ENCOUNTER — Ambulatory Visit (INDEPENDENT_AMBULATORY_CARE_PROVIDER_SITE_OTHER): Payer: 59 | Admitting: Physical Therapy

## 2017-03-06 DIAGNOSIS — M542 Cervicalgia: Secondary | ICD-10-CM | POA: Diagnosis not present

## 2017-03-06 DIAGNOSIS — R293 Abnormal posture: Secondary | ICD-10-CM | POA: Diagnosis not present

## 2017-03-06 DIAGNOSIS — M6281 Muscle weakness (generalized): Secondary | ICD-10-CM | POA: Diagnosis not present

## 2017-03-06 NOTE — Therapy (Signed)
[facility] [place] [facility] [address] [place], [place], [address] Phone: [phone]   Fax:  [phone]  Physical Therapy Treatment  Patient Details  Name: Jackie [name] MRN: [id number] Date of Birth: 1970/10/27 Referring Provider: Natalia Leatherwood, DO  Encounter Date: 03/06/2017      PT End of Session - 03/06/17 1128    Visit Number 5   Number of Visits 6   Date for PT Re-Evaluation 03/28/17   Authorization Type UHC    PT Start Time 0937   PT Stop Time 1026   PT Time Calculation (min) 49 min   Activity Tolerance Patient tolerated treatment well   Behavior During Therapy [facility] for tasks assessed/performed      Past Medical History:  Diagnosis Date  . Allergy    seasonal   . Anemia    during pregnancy  . Anemia, unspecified 04/27/2014  . Chicken pox 46 yrs old  . Dysmenorrhea 10/20/2011  . Endometriosis 12/22/2012  . Fibrocystic breast 07/22/2011  . Hair loss 07/03/2014  . Headache(784.0)    otc med prn  . 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 Hyponatremia 12/22/2012  . Low back pain 10/26/2014  . Lymphadenopathy 08/02/2015  . Miliaria   . Missed abortion    resolved on it's own - no surgery required.  . Pleurisy 08/29/2011   History - resolved  . Preventative health care 03/07/2013  . SVD (spontaneous vaginal delivery)    x 1  . Thyroid dysfunction 07/22/2011   history - resolved, no current problem  . Tinea versicolor 03/07/2013  . Vaginitis 07/15/2012  . Vitamin D deficiency 2012    Past Surgical History:  Procedure Laterality Date  . DENTAL SURGERY  11/2013  . LAPAROSCOPIC SUPRACERVICAL HYSTERECTOMY N/A 12/17/2013   Procedure: LAPAROSCOPIC  SUPRACERVICAL HYSTERECTOMY WITH BILATERAL SALPINGECTOMY;  Surgeon: Oliver Pila, MD;  Location: [facility];  Service: Gynecology;  Laterality: N/A;  2hrs OR time    There were no vitals filed for this visit.      Subjective Assessment - 03/06/17 0939    Subjective feels  the dry needling is helping; still feels tight muscles but overall pain and referral patterns are improved   Patient Stated Goals improve pain   Currently in Pain? Yes   Pain Score 4    Pain Location Shoulder   Pain Orientation Left   Pain Descriptors / Indicators Discomfort   Pain Type Acute pain   Pain Radiating Towards LUE   Pain Onset 1 to 4 weeks ago   Pain Frequency Constant   Aggravating Factors  driving, stress   Pain Relieving Factors heat/ice                         [facility] Adult PT Treatment/Exercise - 03/06/17 1015      Modalities   Modalities Moist Heat;Electrical Stimulation     Moist Heat Therapy   Number Minutes Moist Heat 15 Minutes   Moist Heat Location Wrist;Elbow;Shoulder     Electrical Stimulation   Electrical Stimulation Location Lt shoulder   Electrical Stimulation Action IFC to tolerance x 15 min   Electrical Stimulation Goals Pain;Tone     Manual Therapy   Manual Therapy Soft tissue mobilization;Myofascial release;Manual Traction   Soft tissue mobilization LUE including UT, cervical paraspinals, levator scapula, biceps and wrist extensors.     Myofascial Release LUE including UT, cervical paraspinals, levator scapula, biceps and wrist extensors.  Subocciptial release  Manual Traction cervical spine 5 x 20 sec hold                     PT Long Term Goals - 03/06/17 1128      PT LONG TERM GOAL #1   Title independent with HEP (03/28/17)   Status On-going     PT LONG TERM GOAL #2   Title perform neck and LUE AROM without increase in pain for improved function (03/28/17)   Status On-going     PT LONG TERM GOAL #3   Title report 75% improvement in LUE pain for improved functional use (03/28/17)   Status On-going     PT LONG TERM GOAL #4   Title verbalize understanding of posture/body mechanics to decrease risk of reinjury (03/28/17)   Status On-going               Plan - 03/06/17 1128    Clinical Impression  Statement Pt reports significant improvement in pain overall and states most referred pain has subsided.  Pt states she still has occasional episodes of tingling in upper trap/cervical area as well as some sensations into Lt forearm and 1-3 fingers.  Recommended pt follow up with MD if symptoms do not improve.  Manual cervical traction performed today with trigger point release to Lt upper trap to see if some symptoms improve.  Will plan to renew or hold next session.   PT Treatment/Interventions ADLs/Self Care Home Management;Cryotherapy;Electrical Stimulation;Moist Heat;Ultrasound;Traction;Therapeutic exercise;Therapeutic activities;Patient/family education;Dry needling;Taping;Manual techniques;Passive range of motion   PT Next Visit Plan check goals, renew v. hold depending on how pt is doing. manual/modalities/TDN PRN   Consulted and Agree with Plan of Care Patient      Patient will benefit from skilled therapeutic intervention in order to improve the following deficits and impairments:  Postural dysfunction, Increased fascial restricitons, Pain, Impaired UE functional use  Visit Diagnosis: Cervicalgia  Abnormal posture  Muscle weakness (generalized)     Problem List Patient Active Problem List   Diagnosis Date Noted  . Pain in left arm 02/07/2017  . Family distress 10/26/2016  . Lymphadenopathy 08/02/2015  . 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 Allergic rhinitis 05/10/2013  . Endometriosis 12/22/2012  . Eczema 12/22/2011  . Anxiety disorder 12/22/2011  . Ovarian cyst 10/20/2011  . Chest pain, atypical 08/24/2011  . Palpitations 08/05/2011  . Neck pain 08/03/2011  . Vitamin D deficiency 07/22/2011  . Thyroid dysfunction 07/22/2011      Clarita CraneStephanie F Dodge Ator, PT, DPT 03/06/17 11:35 AM    [place] [facility] [address] [place], [place], [address] Phone: [phone]   Fax:  [phone]  Name: Jackie FlackKausar  [name] MRN: [id number] Date of Birth: 1970-10-16

## 2017-03-13 ENCOUNTER — Ambulatory Visit (INDEPENDENT_AMBULATORY_CARE_PROVIDER_SITE_OTHER): Payer: 59 | Admitting: Physical Therapy

## 2017-03-13 DIAGNOSIS — M6281 Muscle weakness (generalized): Secondary | ICD-10-CM

## 2017-03-13 DIAGNOSIS — M542 Cervicalgia: Secondary | ICD-10-CM

## 2017-03-13 DIAGNOSIS — R293 Abnormal posture: Secondary | ICD-10-CM | POA: Diagnosis not present

## 2017-03-13 NOTE — Therapy (Signed)
[facility] [address] [place], [place], [address] Phone: [phone]   Fax:  [phone]  Physical Therapy Treatment  Patient Details  Name: Jackie [name] MRN: [id number] Date of Birth: 1971/09/18 Referring Provider: Ma Hillock, DO  Encounter Date: 03/13/2017      PT End of Session - 03/13/17 1007    Visit Number 6   PT Start Time 0930   PT Stop Time 1005  d/c visit-see note   PT Time Calculation (min) 35 min   Activity Tolerance Patient tolerated treatment well   Behavior During Therapy [facility] for tasks assessed/performed      Past Medical History:  Diagnosis Date  . Allergy    seasonal   . Anemia    during pregnancy  . Anemia, unspecified 04/27/2014  . Chicken pox 46 yrs old  . Dysmenorrhea 10/20/2011  . Endometriosis 12/22/2012  . Fibrocystic breast 07/22/2011  . Hair loss 07/03/2014  . Headache(784.0)    otc med prn  . 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 Hyponatremia 12/22/2012  . Low back pain 10/26/2014  . Lymphadenopathy 08/02/2015  . Miliaria   . Missed abortion    resolved on it's own - no surgery required.  . Pleurisy 08/29/2011   History - resolved  . Preventative health care 03/07/2013  . SVD (spontaneous vaginal delivery)    x 1  . Thyroid dysfunction 07/22/2011   history - resolved, no current problem  . Tinea versicolor 03/07/2013  . Vaginitis 07/15/2012  . Vitamin D deficiency 2012    Past Surgical History:  Procedure Laterality Date  . DENTAL SURGERY  11/2013  . LAPAROSCOPIC SUPRACERVICAL HYSTERECTOMY N/A 12/17/2013   Procedure: LAPAROSCOPIC  SUPRACERVICAL HYSTERECTOMY WITH BILATERAL SALPINGECTOMY;  Surgeon: Logan Bores, MD;  Location: [facility];  Service: Gynecology;  Laterality: N/A;  2hrs OR time    There were no vitals filed for this visit.      Subjective Assessment - 03/13/17 0932    Subjective feels the dull ache is back in the shoulder; had tingling sensation into  face/jaw over weekend.  feels like she's using her LUE more.  had a stressful weekend though.  Overall, feels 80-90% improved.   Patient Stated Goals improve pain   Currently in Pain? Yes   Pain Score 7    Pain Location Shoulder   Pain Orientation Left   Pain Descriptors / Indicators Discomfort;Aching   Pain Type Acute pain   Pain Onset 1 to 4 weeks ago   Pain Frequency Constant   Aggravating Factors  driving, stress   Pain Relieving Factors heat/ice            [facility] PT Assessment - 03/13/17 1006      AROM   Overall AROM Comments no increase in pain with AROM neck and shoulders today      Self Care: Long discussion with pt about current status and condition.  Pt overall feels 80-90% better but pain seems to be triggered by stress, and until pt is able to successfully address stress at home will continue to have pain.  Pt understanding and in agreement.  Recommended pt follow up with MD for further recommendations and referrals for appropriate resources to help with home environment.  As all PT goals are met, will d/c today but recommend continued follow up with MD.  Educated on posture and body mechanics as well as reviewed HEP with pt today.  PT Education - 03/13/17 1007    Education provided Yes   Education Details see note   Person(s) Educated Patient   Methods Explanation;Demonstration;Handout   Comprehension Verbalized understanding             PT Long Term Goals - 03/13/17 1007      PT LONG TERM GOAL #1   Title independent with HEP (03/28/17)   Status Achieved     PT LONG TERM GOAL #2   Title perform neck and LUE AROM without increase in pain for improved function (03/28/17)   Status Achieved     PT LONG TERM GOAL #3   Title report 75% improvement in LUE pain for improved functional use (03/28/17)   Status Achieved     PT LONG TERM GOAL #4   Title verbalize understanding of posture/body mechanics to decrease risk of  reinjury (03/28/17)   Status Achieved               Plan - 03/13/17 1008    Clinical Impression Statement Pt has met all goals, but continues to have increased pain and discomfort which seems to be mostly triggered by stress.  Pt currently in stressful home environment and long discussion with pt today about pain, triggers and overall function.  At this time feel pt has achieved maximal rehab benefit, and recommend follow up with MD and/or social worker to help with additional resources for home enviroment.  Pt in agreement and plans to follow up with MD.   PT Treatment/Interventions ADLs/Self Care Home Management;Cryotherapy;Electrical Stimulation;Moist Heat;Ultrasound;Traction;Therapeutic exercise;Therapeutic activities;Patient/family education;Dry needling;Taping;Manual techniques;Passive range of motion   PT Next Visit Plan d/c PT today      Patient will benefit from skilled therapeutic intervention in order to improve the following deficits and impairments:  Postural dysfunction, Increased fascial restricitons, Pain, Impaired UE functional use  Visit Diagnosis: Cervicalgia  Abnormal posture  Muscle weakness (generalized)     Problem List Patient Active Problem List   Diagnosis Date Noted  . Pain in left arm 02/07/2017  . Family distress 10/26/2016  . Lymphadenopathy 08/02/2015  . 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 Allergic rhinitis 05/10/2013  . Endometriosis 12/22/2012  . Eczema 12/22/2011  . Anxiety disorder 12/22/2011  . Ovarian cyst 10/20/2011  . Chest pain, atypical 08/24/2011  . Palpitations 08/05/2011  . Neck pain 08/03/2011  . Vitamin D deficiency 07/22/2011  . Thyroid dysfunction 07/22/2011      Laureen Abrahams, PT, DPT 03/13/17 10:33 AM    [place], [place], [address] Phone: [phone]   Fax:  [phone]  Name: Jackie [name] MRN: [id number] Date  of Birth: [birth year]      PHYSICAL THERAPY DISCHARGE SUMMARY  Visits from Start of Care: 6  Current functional level related to goals / functional outcomes: See above   Remaining deficits: See above   Education / Equipment: HEP, posture/body mechanics Plan: Patient agrees to discharge.  Patient goals were met. Patient is being discharged due to meeting the stated rehab goals.  ?????     Laureen Abrahams, PT, DPT 03/13/17 10:33 AM

## 2017-03-13 NOTE — Patient Instructions (Addendum)
Sleeping on Back  Place pillow under knees. A pillow with cervical support and a roll around waist are also helpful. Copyright  VHI. All rights reserved.  Sleeping on Side Place pillow between knees. Use cervical support under neck and a roll around waist as needed. Copyright  VHI. All rights reserved.   Sleeping on Stomach   If this is the only desirable sleeping position, place pillow under lower legs, and under stomach or chest as needed.  Posture - Sitting   Sit upright, head facing forward. Try using a roll to support lower back. Keep shoulders relaxed, and avoid rounded back. Keep hips level with knees. Avoid crossing legs for long periods. Stand to Sit / Sit to Stand   To sit: Bend knees to lower self onto front edge of chair, then scoot back on seat. To stand: Reverse sequence by placing one foot forward, and scoot to front of seat. Use rocking motion to stand up.   Work Height and Reach  Ideal work height is no more than 2 to 4 inches below elbow level when standing, and at elbow level when sitting. Reaching should be limited to arm's length, with elbows slightly bent.  Bending  Bend at hips and knees, not back. Keep feet shoulder-width apart.    Posture - Standing   Good posture is important. Avoid slouching and forward head thrust. Maintain curve in low back and align ears over shoul- ders, hips over ankles.  Alternating Positions   Alternate tasks and change positions frequently to reduce fatigue and muscle tension. Take rest breaks. Computer Work   Position work to face forward. Use proper work and seat height. Keep shoulders back and down, wrists straight, and elbows at right angles. Use chair that provides full back support. Add footrest and lumbar roll as needed.  Getting Into / Out of Car  Lower self onto seat, scoot back, then bring in one leg at a time. Reverse sequence to get out.  Dressing  Lie on back to pull socks or slacks over feet, or sit  and bend leg while keeping back straight.    Housework - Sink  Place one foot on ledge of cabinet under sink when standing at sink for prolonged periods.   Pushing / Pulling  Pushing is preferable to pulling. Keep back in proper alignment, and use leg muscles to do the work.  Deep Squat   Squat and lift with both arms held against upper trunk. Tighten stomach muscles without holding breath. Use smooth movements to avoid jerking.  Avoid Twisting   Avoid twisting or bending back. Pivot around using foot movements, and bend at knees if needed when reaching for articles.  Carrying Luggage   Distribute weight evenly on both sides. Use a cart whenever possible. Do not twist trunk. Move body as a unit.   Lifting Principles .Maintain proper posture and head alignment. .Slide object as close as possible before lifting. .Move obstacles out of the way. .Test before lifting; ask for help if too heavy. .Tighten stomach muscles without holding breath. .Use smooth movements; do not jerk. .Use legs to do the work, and pivot with feet. .Distribute the work load symmetrically and close to the center of trunk. .Push instead of pull whenever possible.   Ask For Help   Ask for help and delegate to others when possible. Coordinate your movements when lifting together, and maintain the low back curve.  Log Roll   Lying on back, bend left knee and place left   arm across chest. Roll all in one movement to the right. Reverse to roll to the left. Always move as one unit. Housework - Sweeping  Use long-handled equipment to avoid stooping.   Housework - Wiping  Position yourself as close as possible to reach work surface. Avoid straining your back.  Laundry - Unloading Wash   To unload small items at bottom of washer, lift leg opposite to arm being used to reach.  Gardening - Raking  Move close to area to be raked. Use arm movements to do the work. Keep back straight and avoid  twisting.     Cart  When reaching into cart with one arm, lift opposite leg to keep back straight.   Getting Into / Out of Bed  Lower self to lie down on one side by raising legs and lowering head at the same time. Use arms to assist moving without twisting. Bend both knees to roll onto back if desired. To sit up, start from lying on side, and use same move-ments in reverse. Housework - Vacuuming  Hold the vacuum with arm held at side. Step back and forth to move it, keeping head up. Avoid twisting.   Laundry - Armed forces training and education officerLoading Wash  Position laundry basket so that bending and twisting can be avoided.   Laundry - Unloading Dryer  Squat down to reach into clothes dryer or use a reacher.  Gardening - Weeding / Psychiatric nurselanting  Squat or Kneel. Knee pads may be helpful.                     Flexibility: Upper Trapezius Stretch    Gently grasp right side of head while reaching behind back with other hand. Tilt head away until a gentle stretch is felt. Hold _30___ seconds. Repeat __3__ times per set. Do __1__ sets per session. Do __2-3__ sessions per day.   Levator Scapula Stretch, Sitting    Sit, one hand tucked under hip on side to be stretched, other hand over top of head. Turn head toward other side and look down. Use hand on head to gently stretch neck in that position. Hold _30__ seconds. Repeat _3__ times per session. Do _2-3__ sessions per day.  Scapular Retraction (Standing)    With arms at sides, pinch shoulder blades together. Repeat __10__ times per set. Do __1__ sets per session. Do _2-3___ sessions per day.

## 2017-03-23 ENCOUNTER — Ambulatory Visit (INDEPENDENT_AMBULATORY_CARE_PROVIDER_SITE_OTHER): Payer: 59 | Admitting: Family Medicine

## 2017-03-23 ENCOUNTER — Encounter: Payer: Self-pay | Admitting: Family Medicine

## 2017-03-23 VITALS — BP 102/69 | HR 74 | Temp 98.2°F | Resp 20 | Ht 62.0 in | Wt 140.5 lb

## 2017-03-23 DIAGNOSIS — M5030 Other cervical disc degeneration, unspecified cervical region: Secondary | ICD-10-CM

## 2017-03-23 DIAGNOSIS — R201 Hypoesthesia of skin: Secondary | ICD-10-CM

## 2017-03-23 DIAGNOSIS — R202 Paresthesia of skin: Secondary | ICD-10-CM

## 2017-03-23 LAB — COMPLETE METABOLIC PANEL WITH GFR
ALT: 16 U/L (ref 6–29)
AST: 20 U/L (ref 10–35)
Albumin: 4 g/dL (ref 3.6–5.1)
Alkaline Phosphatase: 53 U/L (ref 33–115)
BUN: 15 mg/dL (ref 7–25)
CO2: 23 mmol/L (ref 20–31)
Calcium: 9.1 mg/dL (ref 8.6–10.2)
Chloride: 105 mmol/L (ref 98–110)
Creat: 0.72 mg/dL (ref 0.50–1.10)
GFR, Est African American: >89 mL/min (ref >=60)
GFR, Est Non African American: >89 mL/min (ref >=60)
Glucose, Bld: 94 mg/dL (ref 65–99)
Potassium: 4.8 mmol/L (ref 3.5–5.3)
Sodium: 138 mmol/L (ref 135–146)
Total Bilirubin: 0.3 mg/dL (ref 0.2–1.2)
Total Protein: 6.8 g/dL (ref 6.1–8.1)

## 2017-03-23 LAB — VITAMIN B12: Vitamin B-12: 552 pg/mL (ref 211–911)

## 2017-03-23 LAB — TSH: TSH: 3.42 u[IU]/mL (ref 0.35–4.50)

## 2017-03-23 NOTE — Progress Notes (Signed)
Jackie [name] , 08/13/1971, 46 y.o., female MRN: [id number] Patient Care Team    Relationship Specialty Notifications Start End  Natalia Leatherwood, DO PCP - General Family Medicine  05/16/16     Chief Complaint  Patient presents with  . Numbness    left arm elbow down   . arm tingling    right     Subjective:   Musculoskeletal complaints:  Patient presents today concerning her bilateral arm pain. Patient was seen approximately 6 weeks ago with mostly left-sided neck, shoulder and arm pain. X-ray was obtained at that time which resulted with C4-C5 mild disc space narrowing and bone spur, C5-C6 disc space narrowing and bone spur, C6-7 unciate hypertrophy. She has been taking NSAIDs, using heating pads, has had dry needling and physical therapy. She feels that her left shoulder had gotten much better, especially after the dry needling. However after yard work the last few days she has noticed increased in her left and right arm pain. She states it is intermittent, sharp shooting electric-like pain. The pain is worse in her left forearm to hand. There is no particular movement that seems to exacerbate the symptoms.  Prior note:  Patient presents with worsening left-sided neck, shoulder pain that radiates to her left hand. She states that the pain has been present for a couple months, but has been getting worse. She knew she has anxiety, and felt that the knots in her neck and shoulder were possibly secondary to her anxiety. She points to her left superior scapula as area of worse pain. She states it's a constant gnawing dull pain, with occasional sharp shooting pains down to her hand. She feels the left side of her neck is constantly tense and uncomfortable. She denies prior injury to her neck or shoulder. She denies prior history of arthritis. She also endorses recently falling when walking on a handicap ramp, she slipped hitting her elbow and knee on the left side. Her elbow sustained an  abrasion which she has been treating with topical antibiotic ointment, but states it still painful. She feels she is hearing a "click" in her elbow. She does have full range of motion. Her left knee is mildly tender, not swollen.  CLINICAL DATA:  46 year old female with left neck pain extending into left shoulder with numbness for 2 weeks. Muscle spasm. No reported trauma. Initial encounter. EXAM: CERVICAL SPINE - 2-3 VIEW COMPARISON:  10/16/2014 plain film exam. FINDINGS: Minimal C4-5 disc space narrowing and spur. Mild C5-6 disc space narrowing and spur. Minimal uncinate hypertrophy bilaterally C4-5 thru C6-7. Tiny cervical ribs. Slight straightening of cervical spine. No obvious fracture or dislocation.  Oblique views not obtained. Biapical pleural thickening greater on the left stable without associated bony destruction. IMPRESSION: Minimal to mild degenerative changes as detailed above without significant change.  No flowsheet data found.  Allergies  Allergen Reactions  . Clindamycin/Lincomycin Other (See Comments)    GI upset  . Nizoral [Ketoconazole]     Burning and dry skin   Social History  Substance Use Topics  . Smoking status: Never Smoker  . Smokeless tobacco: Never Used  . Alcohol use Yes     Comment: 1 to 2 glasses 5 times a week. but then the next week maybe nothing   Past Medical History:  Diagnosis Date  . Allergy    seasonal   . Anemia    during pregnancy  . Anemia, unspecified 04/27/2014  . Chicken pox 46 yrs old  .  Dysmenorrhea 10/20/2011  . Endometriosis 12/22/2012  . Fibrocystic breast 07/22/2011  . Hair loss 07/03/2014  . Headache(784.0)    otc med prn  . 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 Hyponatremia 12/22/2012  . Low back pain 10/26/2014  . Lymphadenopathy 08/02/2015  . Miliaria   . Missed abortion    resolved on it's own - no surgery required.  . Pleurisy 08/29/2011   History - resolved  . Preventative health care  03/07/2013  . SVD (spontaneous vaginal delivery)    x 1  . Thyroid dysfunction 07/22/2011   history - resolved, no current problem  . Tinea versicolor 03/07/2013  . Vaginitis 07/15/2012  . Vitamin D deficiency 2012   Past Surgical History:  Procedure Laterality Date  . DENTAL SURGERY  11/2013  . LAPAROSCOPIC SUPRACERVICAL HYSTERECTOMY N/A 12/17/2013   Procedure: LAPAROSCOPIC  SUPRACERVICAL HYSTERECTOMY WITH BILATERAL SALPINGECTOMY;  Surgeon: Oliver Pila, MD;  Location: [facility];  Service: Gynecology;  Laterality: N/A;  2hrs OR time   Family History  Problem Relation Age of Onset  . Diabetes Mother        type 2  . Stroke Mother   . Osteoporosis Mother   . Heart disease Mother        angina  . Heart attack Father        MI in his 28s  . Diabetes Father        type 2  . Obesity Sister   . Diabetes Sister   . Obesity Brother   . Diabetes Brother        type 2  . Aneurysm Maternal Grandmother   . Heart attack Maternal Grandfather   . Pneumonia Paternal Grandfather   . Hypertension Sister        induced  . Other Sister        anemic  . Anemia Sister   . Depression Sister    Allergies as of 03/23/2017      Reactions   Clindamycin/lincomycin Other (See Comments)   GI upset   Nizoral [ketoconazole]    Burning and dry skin      Medication List       Accurate as of 03/23/17  9:11 AM. Always use your most recent med list.          B-complex with vitamin C tablet Take 1 tablet by mouth daily.   baclofen 10 MG tablet Commonly known as:  LIORESAL Take 1 tablet (10 mg total) by mouth 3 (three) times daily.   FLAX SEED OIL PO Take by mouth.   ibuprofen 200 MG tablet Commonly known as:  ADVIL,MOTRIN Take 200 mg by mouth every 6 (six) hours as needed.   lactobacillus acidophilus Tabs tablet Take 1 tablet by mouth daily. Reported on 03/30/2016   loratadine 10 MG tablet Commonly known as:  CLARITIN Take 10 mg by mouth daily as needed for allergies.   Magnesium  400 MG Caps Take 400 mg by mouth daily. Reported on 03/30/2016   montelukast 10 MG tablet Commonly known as:  SINGULAIR Take 1 tablet (10 mg total) by mouth at bedtime.   multivitamin tablet Take 1 tablet by mouth daily.   triamcinolone cream 0.1 % Commonly known as:  KENALOG Apply 1 application topically 2 (two) times daily.       All past medical history, surgical history, allergies, family history, immunizations andmedications were updated in the EMR today and reviewed under the history and medication portions of their EMR.  ROS: Negative, with the exception of above mentioned in HPI   Objective:  BP 102/69 (BP Location: Left Arm, Patient Position: Sitting, Cuff Size: Normal)   Pulse 74   Temp 98.2 F (36.8 C)   Resp 20   Ht 5\' 2"  (1.575 m)   Wt 140 lb 8 oz (63.7 kg)   LMP 12/03/2013   SpO2 97%   BMI 25.70 kg/m  Body mass index is 25.7 kg/m.  Gen: Afebrile. No acute distress.  HENT: AT. [place]. MMM.  Eyes:Pupils Equal Round Reactive to light, Extraocular movements intact,  Conjunctiva without redness, discharge or icterus. MSK:    Cervical spine: No erythema, no soft tissue swelling. Left upper trapezium with spasm. Full range of motion cervical spine. No tenderness to palpation.    Upper extremity: No erythema, no soft tissue swelling, full range of motion bilateral upper extremity. Event he can test, negative Hawkins, negative O'Brien's, negative Tinel's. Neurovascularly intact distally.  Neuro: Normal gait. PERLA. EOMi. Alert. Oriented. Cranial nerves II through XII intact. Facial symmetry.  No exam data present No results found. No results found for this or any previous visit (from the past 24 hour(s)).  Assessment/Plan: Jackie [name] is a 46 y.o. female present for OV for  DDD (degenerative disc disease), cervical - Worsening. - Patient with increased radiation of discomfort, now bilateral arms with increased activity. She was tolerating physical therapy  quite well, however recent setback with gardening. Provided the option of gabapentin/Lyrica and patient would like to refrain from taking medications at this time. She will continue NSAIDs, ice/heat as needed. Will refer to neurology for further workup and options for treatment. - Ambulatory referral to Neurology  Facial tingling sensation - new - Discussed with patient this is likely secondary to her increase in stress and anxiety. Will rule out electrolytes, vitamin deficiency and parathyroid as etiology. She is starting with psychologist next week for therapy. - COMPLETE METABOLIC PANEL WITH GFR - PTH, Intact and Calcium - TSH - B12 - Alarm signs discussed and when to seek emergency treatment.  Reviewed expectations re: course of current medical issues.  Discussed self-management of symptoms.  Outlined signs and symptoms indicating need for more acute intervention.  Patient verbalized understanding and all questions were answered.  Patient received an After-Visit Summary.   > 25 minutes spent with patient, >50% of time spent face to face counseling and/or coordinating care.   Note is dictated utilizing voice recognition software. Although note has been proof read prior to signing, occasional typographical errors still can be missed. If any questions arise, please do not hesitate to call for verification.   electronically signed by:  Felix Pacinienee Kuneff, DO  [facility] - OR

## 2017-03-23 NOTE — Patient Instructions (Signed)
I have referred you to a neurologist to further investigation.  I will call you with labs once I receive them.  I am so proud of you. I am glad you got in to see Dr.Mendleson.

## 2017-03-24 ENCOUNTER — Telehealth: Payer: Self-pay | Admitting: Family Medicine

## 2017-03-24 LAB — PTH, INTACT AND CALCIUM
Calcium: 9.1 mg/dL (ref 8.6–10.2)
PTH: 42 pg/mL (ref 14–64)

## 2017-03-24 NOTE — Telephone Encounter (Signed)
Spoke with patient reviewed lab results. 

## 2017-03-24 NOTE — Telephone Encounter (Signed)
Please call pt: - all labs  Normal.

## 2017-03-30 ENCOUNTER — Ambulatory Visit (INDEPENDENT_AMBULATORY_CARE_PROVIDER_SITE_OTHER): Payer: 59 | Admitting: Clinical

## 2017-03-30 DIAGNOSIS — F4323 Adjustment disorder with mixed anxiety and depressed mood: Secondary | ICD-10-CM

## 2017-04-18 ENCOUNTER — Ambulatory Visit (INDEPENDENT_AMBULATORY_CARE_PROVIDER_SITE_OTHER): Payer: 59 | Admitting: Clinical

## 2017-04-18 DIAGNOSIS — F4323 Adjustment disorder with mixed anxiety and depressed mood: Secondary | ICD-10-CM

## 2017-05-03 ENCOUNTER — Ambulatory Visit (INDEPENDENT_AMBULATORY_CARE_PROVIDER_SITE_OTHER): Payer: 59 | Admitting: Clinical

## 2017-05-03 DIAGNOSIS — F4323 Adjustment disorder with mixed anxiety and depressed mood: Secondary | ICD-10-CM

## 2017-05-05 ENCOUNTER — Encounter: Payer: Self-pay | Admitting: Neurology

## 2017-05-05 ENCOUNTER — Ambulatory Visit (INDEPENDENT_AMBULATORY_CARE_PROVIDER_SITE_OTHER): Payer: 59 | Admitting: Neurology

## 2017-05-05 VITALS — BP 125/80 | HR 80 | Ht 62.0 in | Wt 139.5 lb

## 2017-05-05 DIAGNOSIS — R202 Paresthesia of skin: Secondary | ICD-10-CM | POA: Diagnosis not present

## 2017-05-05 DIAGNOSIS — M79601 Pain in right arm: Secondary | ICD-10-CM

## 2017-05-05 DIAGNOSIS — M79602 Pain in left arm: Secondary | ICD-10-CM

## 2017-05-05 NOTE — Patient Instructions (Signed)
   We will check MRI of the neck and get EMG and NCV evaluation to look at nerve function of the arms.

## 2017-05-05 NOTE — Progress Notes (Signed)
Reason for visit: Bilateral arm discomfort and fatigue  Referring physician: Dr. Lavinia Sharps [name] is a 46 y.o. female  History of present illness:  Jackie [name] is a 46 year old right-handed [place] female with a history of onset of left shoulder discomfort that began about 8 weeks ago. The patient indicated she had a knot in the left shoulder, she underwent physical therapy which seemed to be of some benefit. The patient has been doing stretching exercises off and on since that time. The patient is now having intermittent symptoms that include both arms. She feels as if there is a fatigue component in both arms and she has difficulty lifting a gallon of milk out of the refrigerator because of this. She may occasionally have some tingling sensations down the arms and she more recently has noted some sensation of water running down the left side of her head and face. The patient has had significant increased stress with her marriage recently, she is not sleeping well at night. She denies any symptoms with the legs, she denies problems with balance. She denies issues controlling the bowels or the bladder. She does have occasional headaches, but this is not frequent, the headaches are all over the head. The patient does report some neck stiffness. She denies any symptoms down either leg with head turning to one side or the other. She denies dizziness, she may have some occasional blurred vision, she denies any problems with speech or swallowing. The patient feels normal most of the time, her symptoms are truly intermittent. She has had a cervical spine x-ray that shows evidence of small bilateral cervical ribs, she has minimal disc space narrowing at the C4-5 level and C5-6 level. Slight spondylitic changes are seen at the C4-5 and C6-7 levels. The patient is sent to this office for further evaluation.   Past Medical History:  Diagnosis Date  . Allergy    seasonal   . Anemia    during  pregnancy  . Anemia, unspecified 04/27/2014  . Chicken pox 46 yrs old  . Dysmenorrhea 10/20/2011  . Endometriosis 12/22/2012  . Fibrocystic breast 07/22/2011  . Hair loss 07/03/2014  . Headache(784.0)    otc med prn  . 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 Hyponatremia 12/22/2012  . Low back pain 10/26/2014  . Lymphadenopathy 08/02/2015  . Miliaria   . Missed abortion    resolved on it's own - no surgery required.  . Pleurisy 08/29/2011   History - resolved  . Preventative health care 03/07/2013  . SVD (spontaneous vaginal delivery)    x 1  . Thyroid dysfunction 07/22/2011   history - resolved, no current problem  . Tinea versicolor 03/07/2013  . Vaginitis 07/15/2012  . Vitamin D deficiency 2012    Past Surgical History:  Procedure Laterality Date  . DENTAL SURGERY  11/2013  . LAPAROSCOPIC SUPRACERVICAL HYSTERECTOMY N/A 12/17/2013   Procedure: LAPAROSCOPIC  SUPRACERVICAL HYSTERECTOMY WITH BILATERAL SALPINGECTOMY;  Surgeon: Oliver Pila, MD;  Location: [facility];  Service: Gynecology;  Laterality: N/A;  2hrs OR time    Family History  Problem Relation Age of Onset  . Diabetes Mother        type 2  . Stroke Mother   . Osteoporosis Mother   . Heart disease Mother        angina  . Heart attack Father        MI in his 67s  . Diabetes Father  type 2  . Obesity Sister   . Diabetes Sister   . Obesity Brother   . Diabetes Brother        type 2  . Aneurysm Maternal Grandmother   . Heart attack Maternal Grandfather   . Pneumonia Paternal Grandfather   . Hypertension Sister        induced  . Other Sister        anemic  . Anemia Sister   . Depression Sister     Social history:  reports that she has never smoked. She has never used smokeless tobacco. She reports that she drinks alcohol. She reports that she does not use drugs.  Medications:  Prior to Admission medications   Medication Sig Start Date End Date Taking? Authorizing Provider  B Complex-C  (B-COMPLEX WITH VITAMIN C) tablet Take 1 tablet by mouth daily.   Yes [provider]  ibuprofen (ADVIL,MOTRIN) 200 MG tablet Take 200 mg by mouth every 6 (six) hours as needed.   Yes [provider]  loratadine (CLARITIN) 10 MG tablet Take 10 mg by mouth daily as needed for allergies.   Yes [provider]  Magnesium 400 MG CAPS Take 400 mg by mouth daily. Reported on 03/30/2016   Yes [provider]  Multiple Vitamin (MULTIVITAMIN) tablet Take 1 tablet by mouth daily.     Yes [provider]  triamcinolone cream (KENALOG) 0.1 % Apply 1 application topically 2 (two) times daily. 10/26/16  Yes Kuneff, Renee A, DO  baclofen (LIORESAL) 10 MG tablet Take 1 tablet (10 mg total) by mouth 3 (three) times daily. Patient not taking: Reported on 05/05/2017 02/07/17   Kuneff, Renee A, DO  Flaxseed, Linseed, (FLAX SEED OIL PO) Take by mouth.    [provider]  lactobacillus acidophilus (BACID) TABS tablet Take 1 tablet by mouth daily. Reported on 03/30/2016    [provider]  montelukast (SINGULAIR) 10 MG tablet Take 1 tablet (10 mg total) by mouth at bedtime. Patient not taking: Reported on 05/05/2017 10/26/16   Felix Pacini A, DO      Allergies  Allergen Reactions  . Clindamycin/Lincomycin Other (See Comments)    GI upset  . Nizoral [Ketoconazole]     Burning and dry skin    ROS:  Out of a complete 14 system review of symptoms, the patient complains only of the following symptoms, and all other reviewed systems are negative.  Fatigue Easy bruising Achy muscles Allergies Skin sensitivity Weakness, tremor Anxiety, not enough sleep  Blood pressure 125/80, pulse 80, height 5\' 2"  (1.575 m), weight 139 lb 8 oz (63.3 kg), last menstrual period 12/03/2013.  Physical Exam  General: The patient is alert and cooperative at the time of the examination.  Eyes: Pupils are equal, round, and reactive to light. Discs are flat  bilaterally.  Neck: The neck is supple, no carotid bruits are noted.  Respiratory: The respiratory examination is clear.  Cardiovascular: The cardiovascular examination reveals a regular rate and rhythm, no obvious murmurs or rubs are noted.  Neuromuscular: Range of movement of the cervical spine is relatively full. With elevation of the arms, there is no dropout of radial pulse, no change in pulse with head turning from right to left.  Skin: Extremities are without significant edema.  Neurologic Exam  Mental status: The patient is alert and oriented x 3 at the time of the examination. The patient has apparent normal recent and remote memory, with an apparently normal attention span and  concentration ability.  Cranial nerves: Facial symmetry is present. There is good sensation of the face to pinprick and soft touch bilaterally. The strength of the facial muscles and the muscles to head turning and shoulder shrug are normal bilaterally. Speech is well enunciated, no aphasia or dysarthria is noted. Extraocular movements are full. Visual fields are full. The tongue is midline, and the patient has symmetric elevation of the soft palate. No obvious hearing deficits are noted.  Motor: The motor testing reveals 5 over 5 strength of all 4 extremities. Good symmetric motor tone is noted throughout.  Sensory: Sensory testing is intact to pinprick, soft touch, vibration sensation, and position sense on all 4 extremities. No evidence of extinction is noted.  Coordination: Cerebellar testing reveals good finger-nose-finger and heel-to-shin bilaterally.  Gait and station: Gait is normal. Tandem gait is normal. Romberg is negative. No drift is seen.  Reflexes: Deep tendon reflexes are symmetric and normal bilaterally. Toes are downgoing bilaterally.   Assessment/Plan:  1. Neck and shoulder discomfort, bilateral arm fatigue and paresthesias  The patient reports no muscle or symptoms including some  sensory alterations on the left face. Her symptoms are intermittent in nature, they did improve with physical therapy. The cervical spine x-ray did show some slight straightening of the spine suggesting muscle tension. This may be the primary etiology of her current symptoms. The clinical examination was unremarkable. The patient will be sent for MRI of the cervical spine, she will have nerve conduction studies on both arms and EMG on the left arm. The patient will follow-up for the above study. If the symptoms become more persistent, gabapentin can be used for treatment.   Marlan Palau MD 05/05/2017 9:50 AM  [facility] [address] [place], [place] [address]  Phone [phone] Fax [phone]

## 2017-05-09 ENCOUNTER — Ambulatory Visit (INDEPENDENT_AMBULATORY_CARE_PROVIDER_SITE_OTHER): Payer: 59 | Admitting: Clinical

## 2017-05-09 DIAGNOSIS — F4323 Adjustment disorder with mixed anxiety and depressed mood: Secondary | ICD-10-CM

## 2017-05-10 ENCOUNTER — Telehealth: Payer: Self-pay | Admitting: Neurology

## 2017-05-10 NOTE — Telephone Encounter (Signed)
Patient called office returning Emily's call to schedule MRI.  Please call (she will not be available within the next 30 mins)

## 2017-05-10 NOTE — Telephone Encounter (Signed)
I called the patient back to schedule her mri. She informed me she would have to hold off right now on having it due to the finance's once she figures something out with her finance's she will contact me schedule her MRI. I did inform her that we do payment plans interest free.

## 2017-05-11 ENCOUNTER — Telehealth: Payer: Self-pay | Admitting: Neurology

## 2017-05-11 NOTE — Telephone Encounter (Signed)
I spoke to patient on 05/09/17 about scheduling MRI cervical spine. She was not sure if she wanted to do it or not due to the cost I did inform her about the payment plan option that we have she stated she would think about it and contact me if she wants to schedule. Patient called today stating that yesterday she had sensation that started on the right back side of her head and move a little towards her face and then stopped. She stated this does not happen daily it happens when she is under a lot of stress and she stated she has been under a lot of stress lately. She did say she is driving today and will be driving for awhile today. She would like to know if the MRI Cervical spine will be enough for the situation she is having or does she need to have another type of exam along with the Cervical? The best number to contact the patient is [phone] and she did say she will be driving for awhile today.

## 2017-05-11 NOTE — Telephone Encounter (Signed)
I called the patient. The patient has had some sensory symptoms up the right side, most of her symptoms are on the left.  The patient is under a lot of stress, this appears to be a big activator for her symptoms.  We will get MRI the cervical spine. She has been set up for EMG and nerve conduction study evaluation.

## 2017-05-11 NOTE — Telephone Encounter (Signed)
Patient called office to give more information of having tension in her neck radiating into her head after the sensations she has been having.  Patient says her neck is very tight and while doing exercises she is feeling the pressure, yet exercises are helping.  Pressure is directly in the back of her head (lower).  Please call

## 2017-05-15 NOTE — Telephone Encounter (Signed)
Patient is scheduled for MRI Cervical spine for Wednesday 05/17/17 at the [facility] mobile unit.

## 2017-05-16 ENCOUNTER — Ambulatory Visit (INDEPENDENT_AMBULATORY_CARE_PROVIDER_SITE_OTHER): Payer: 59 | Admitting: Family Medicine

## 2017-05-16 ENCOUNTER — Ambulatory Visit (INDEPENDENT_AMBULATORY_CARE_PROVIDER_SITE_OTHER): Payer: 59 | Admitting: Clinical

## 2017-05-16 ENCOUNTER — Encounter: Payer: Self-pay | Admitting: Family Medicine

## 2017-05-16 VITALS — BP 126/79 | HR 65 | Temp 98.0°F | Resp 20 | Wt 140.5 lb

## 2017-05-16 DIAGNOSIS — F4323 Adjustment disorder with mixed anxiety and depressed mood: Secondary | ICD-10-CM

## 2017-05-16 DIAGNOSIS — M25521 Pain in right elbow: Secondary | ICD-10-CM

## 2017-05-16 MED ORDER — NAPROXEN 500 MG PO TABS: 500.0000 mg | ORAL_TABLET | Freq: Two times a day (BID) | ORAL | 0 refills | Status: DC

## 2017-05-16 NOTE — Telephone Encounter (Signed)
Patient calling to reschedule MRI for tomorrow.

## 2017-05-16 NOTE — Progress Notes (Signed)
Jackie [name] , 1971-01-02, 46 y.o., female MRN: [id number] Patient Care Team    Relationship Specialty Notifications Start End  Jackie Leatherwood, DO PCP - General Family Medicine  05/16/16     Chief Complaint  Patient presents with  . Elbow Pain    right     Subjective: Pt presents for an OV with complaints of right elbow pain of 2 weeks duration. She has complained of left elbow pain a few months ago after a fall. However over the last few weeks pain has been increasing in her right elbow when gardening or vacuuming. She points to the ulna/olecranon process area of her arm as location of pain. She states that it is always a dull aching pain, and as the day progresses it becomes more sharp and radiates down her arm. She denies any redness, swelling or fever. She has been trying heat application for comfort. She has noticed vacuuming has needed worse. No flowsheet data found.  Allergies  Allergen Reactions  . Clindamycin/Lincomycin Other (See Comments)    GI upset  . Nizoral [Ketoconazole]     Burning and dry skin   Social History  Substance Use Topics  . Smoking status: Never Smoker  . Smokeless tobacco: Never Used  . Alcohol use Yes     Comment: 1 to 2 glasses 5 times a week. but then the next week maybe nothing   Past Medical History:  Diagnosis Date  . Allergy    seasonal   . Anemia    during pregnancy  . Anemia, unspecified 04/27/2014  . Chicken pox 46 yrs old  . Dysmenorrhea 10/20/2011  . Endometriosis 12/22/2012  . Fibrocystic breast 07/22/2011  . Hair loss 07/03/2014  . Headache(784.0)    otc med prn  . 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 Hyponatremia 12/22/2012  . Low back pain 10/26/2014  . Lymphadenopathy 08/02/2015  . Miliaria   . Missed abortion    resolved on it's own - no surgery required.  . Pleurisy 08/29/2011   History - resolved  . Preventative health care 03/07/2013  . SVD (spontaneous vaginal delivery)    x 1  . Thyroid  dysfunction 07/22/2011   history - resolved, no current problem  . Tinea versicolor 03/07/2013  . Vaginitis 07/15/2012  . Vitamin D deficiency 2012   Past Surgical History:  Procedure Laterality Date  . DENTAL SURGERY  11/2013  . LAPAROSCOPIC SUPRACERVICAL HYSTERECTOMY N/A 12/17/2013   Procedure: LAPAROSCOPIC  SUPRACERVICAL HYSTERECTOMY WITH BILATERAL SALPINGECTOMY;  Surgeon: Oliver Pila, MD;  Location: [facility];  Service: Gynecology;  Laterality: N/A;  2hrs OR time   Family History  Problem Relation Age of Onset  . Diabetes Mother        type 2  . Stroke Mother   . Osteoporosis Mother   . Heart disease Mother        angina  . Heart attack Father        MI in his 82s  . Diabetes Father        type 2  . Obesity Sister   . Diabetes Sister   . Obesity Brother   . Diabetes Brother        type 2  . Aneurysm Maternal Grandmother   . Heart attack Maternal Grandfather   . Pneumonia Paternal Grandfather   . Hypertension Sister        induced  . Other Sister        anemic  .  Anemia Sister   . Depression Sister    Allergies as of 05/16/2017      Reactions   Clindamycin/lincomycin Other (See Comments)   GI upset   Nizoral [ketoconazole]    Burning and dry skin      Medication List       Accurate as of 05/16/17  2:11 PM. Always use your most recent med list.          B-complex with vitamin C tablet Take 1 tablet by mouth daily.   baclofen 10 MG tablet Commonly known as:  LIORESAL Take 10 mg by mouth 3 (three) times daily.   FLAX SEED OIL PO Take by mouth.   ibuprofen 200 MG tablet Commonly known as:  ADVIL,MOTRIN Take 200 mg by mouth every 6 (six) hours as needed.   lactobacillus acidophilus Tabs tablet Take 1 tablet by mouth daily. Reported on 03/30/2016   loratadine 10 MG tablet Commonly known as:  CLARITIN Take 10 mg by mouth daily as needed for allergies.   Magnesium 400 MG Caps Take 400 mg by mouth daily. Reported on 03/30/2016   montelukast 10 MG  tablet Commonly known as:  SINGULAIR Take 1 tablet (10 mg total) by mouth at bedtime.   multivitamin tablet Take 1 tablet by mouth daily.   triamcinolone cream 0.1 % Commonly known as:  KENALOG Apply 1 application topically 2 (two) times daily.       All past medical history, surgical history, allergies, family history, immunizations andmedications were updated in the EMR today and reviewed under the history and medication portions of their EMR.     ROS: Negative, with the exception of above mentioned in HPI   Objective:  BP 126/79 (BP Location: Right Arm, Patient Position: Sitting, Cuff Size: Normal)   Pulse 65   Temp 98 F (36.7 C)   Resp 20   Wt 140 lb 8 oz (63.7 kg)   LMP 12/03/2013   SpO2 98%   BMI 25.70 kg/m  Body mass index is 25.7 kg/m. Gen: Afebrile. No acute distress. Nontoxic in appearance, well developed, well nourished.  MSK: No erythema, no soft tissue swelling. Tenderness to palpation over the olecranon process. Negative Tinel's at the elbow, positive Tinel's at rest. Discomfort with resisted pronation and supination. Neurovascularly intact distally.  No exam data present No results found. No results found for this or any previous visit (from the past 24 hour(s)).  Assessment/Plan: Jackie [name] is a 46 y.o. female present for OV for  Right elbow pain/Golfer's elbow New Problem. Discussed options with patient today. She would like to have area x-rayed, agrees given length/duration of discomfort and worsening. Naproxen 500 mg twice a day 7 days with food scheduled. Encouraged her to purchase a support pain for her forearm. She was guided on which type to get today. - naproxen (NAPROSYN) 500 MG tablet; Take 1 tablet (500 mg total) by mouth 2 (two) times daily with a meal.  Dispense: 30 tablet; Refill: 0 - DG Elbow Complete Right; Future - Rest elbow, no activities that cause increase in pain. We are arm brace during all waking hours. Use NSAIDs as  instructed. Use ice as instructed. If x-rays normal, and. Is not improving Adequately orthopedics for injection.   Reviewed expectations re: course of current medical issues.  Discussed self-management of symptoms.  Outlined signs and symptoms indicating need for more acute intervention.  Patient verbalized understanding and all questions were answered.  Patient received an After-Visit Summary.  No orders of the defined types were placed in this encounter.    Note is dictated utilizing voice recognition software. Although note has been proof read prior to signing, occasional typographical errors still can be missed. If any questions arise, please do not hesitate to call for verification.   electronically signed by:  Howard Pouch, DO  [place]

## 2017-05-16 NOTE — Patient Instructions (Signed)
Naproxen 1 tab every 12 hours with food for 7 days. Then use as needed for flares or pain.   Please have xray completed to make certain no fracture had occurred.    Please pick up arm band we discussed and wear as much as possible for 2 weeks. Avoid activities that cause pain to worsen for 4 weeks.    Tennis Elbow Tennis elbow is puffiness (inflammation) of the outer tendons of your forearm close to your elbow. Your tendons attach your muscles to your bones. Tennis elbow can happen in any sport or job in which you use your elbow too much. It is caused by doing the same motion over and over. Tennis elbow can cause:  Pain and tenderness in your forearm and the outer part of your elbow.  A burning feeling. This runs from your elbow through your arm.  Weak grip in your hands.  Follow these instructions at home: Activity  Rest your elbow and wrist as told by your doctor. Try to avoid any activities that caused the problem until your doctor says that you can do them again.  If a physical therapist teaches you exercises, do all of them as told.  If you lift an object, lift it with your palm facing up. This is easier on your elbow. Lifestyle  If your tennis elbow is caused by sports, check your equipment and make sure that: ? You are using it correctly. ? It fits you well.  If your tennis elbow is caused by work, take breaks often, if you are able. Talk with your manager about doing your work in a way that is safe for you. ? If your tennis elbow is caused by computer use, talk with your manager about any changes that can be made to your work setup. General instructions  If told, apply ice to the painful area: ? Put ice in a plastic bag. ? Place a towel between your skin and the bag. ? Leave the ice on for 20 minutes, 2-3 times per day.  Take medicines only as told by your doctor.  If you were given a brace, wear it as told by your doctor.  Keep all follow-up visits as told by  your doctor. This is important. Contact a doctor if:  Your pain does not get better with treatment.  Your pain gets worse.  You have weakness in your forearm, hand, or fingers.  You cannot feel your forearm, hand, or fingers. This information is not intended to replace advice given to you by your health care provider. Make sure you discuss any questions you have with your health care provider. Document Released: 02/23/2010 Document Revised: 05/05/2016 Document Reviewed: 09/01/2014 Elsevier Interactive Patient Education  [employer].

## 2017-05-16 NOTE — Telephone Encounter (Signed)
I call the patient and left her a voicemail for patient to call me back about rescheduling her mri.

## 2017-05-16 NOTE — Telephone Encounter (Signed)
I spoke to the patient and she informed me that she would like to cancel her MRI for right now and that she is going to seek other medical advice and maybe in a couple weeks once she gets her finance's a little good she may reschedule. I informed her that is okay and that we can always do a payment plan.

## 2017-05-17 ENCOUNTER — Ambulatory Visit (HOSPITAL_BASED_OUTPATIENT_CLINIC_OR_DEPARTMENT_OTHER)
Admission: RE | Admit: 2017-05-17 | Discharge: 2017-05-17 | Disposition: A | Discharge status: Home / Self Care | Payer: 59 | Source: Ambulatory Visit | Attending: Family Medicine | Admitting: Family Medicine

## 2017-05-17 ENCOUNTER — Telehealth: Payer: Self-pay | Admitting: Family Medicine

## 2017-05-17 ENCOUNTER — Other Ambulatory Visit: Payer: 59

## 2017-05-17 DIAGNOSIS — M25521 Pain in right elbow: Secondary | ICD-10-CM | POA: Insufficient documentation

## 2017-05-17 NOTE — Telephone Encounter (Signed)
Please call pt: Her xray of the elbow is normal.

## 2017-05-17 NOTE — Telephone Encounter (Signed)
Left message with xray results on patient voice mail per DPR 

## 2017-05-18 ENCOUNTER — Ambulatory Visit: Payer: 59 | Admitting: Clinical

## 2017-05-25 ENCOUNTER — Ambulatory Visit (INDEPENDENT_AMBULATORY_CARE_PROVIDER_SITE_OTHER): Payer: 59 | Admitting: Clinical

## 2017-05-25 DIAGNOSIS — F4323 Adjustment disorder with mixed anxiety and depressed mood: Secondary | ICD-10-CM

## 2017-05-31 ENCOUNTER — Ambulatory Visit (INDEPENDENT_AMBULATORY_CARE_PROVIDER_SITE_OTHER): Payer: 59 | Admitting: Clinical

## 2017-05-31 ENCOUNTER — Encounter: Payer: 59 | Admitting: Neurology

## 2017-05-31 DIAGNOSIS — F4323 Adjustment disorder with mixed anxiety and depressed mood: Secondary | ICD-10-CM

## 2017-06-08 ENCOUNTER — Ambulatory Visit: Payer: 59 | Admitting: Clinical

## 2017-07-20 ENCOUNTER — Ambulatory Visit (INDEPENDENT_AMBULATORY_CARE_PROVIDER_SITE_OTHER): Payer: 59 | Admitting: Clinical

## 2017-07-20 DIAGNOSIS — F4323 Adjustment disorder with mixed anxiety and depressed mood: Secondary | ICD-10-CM | POA: Diagnosis not present

## 2017-08-03 ENCOUNTER — Ambulatory Visit: Payer: 59 | Admitting: Clinical

## 2017-08-03 DIAGNOSIS — F4323 Adjustment disorder with mixed anxiety and depressed mood: Secondary | ICD-10-CM

## 2017-08-14 ENCOUNTER — Ambulatory Visit: Payer: 59 | Admitting: Clinical

## 2017-08-21 ENCOUNTER — Ambulatory Visit: Payer: 59 | Admitting: Clinical

## 2017-08-21 DIAGNOSIS — F4323 Adjustment disorder with mixed anxiety and depressed mood: Secondary | ICD-10-CM

## 2017-08-24 ENCOUNTER — Other Ambulatory Visit: Payer: Self-pay | Admitting: *Deleted

## 2017-08-24 MED ORDER — TRIAMCINOLONE ACETONIDE 0.1 % EX CREA: 1.0000 "application " | TOPICAL_CREAM | Freq: Two times a day (BID) | CUTANEOUS | 0 refills | Status: DC

## 2017-08-29 ENCOUNTER — Ambulatory Visit: Payer: 59 | Admitting: Clinical

## 2017-11-16 ENCOUNTER — Ambulatory Visit: Payer: Managed Care, Other (non HMO) | Admitting: Clinical

## 2017-11-16 DIAGNOSIS — F4323 Adjustment disorder with mixed anxiety and depressed mood: Secondary | ICD-10-CM | POA: Diagnosis not present

## 2017-11-29 ENCOUNTER — Other Ambulatory Visit: Payer: Self-pay | Admitting: Family Medicine

## 2017-11-29 DIAGNOSIS — Z1231 Encounter for screening mammogram for malignant neoplasm of breast: Secondary | ICD-10-CM

## 2017-12-01 ENCOUNTER — Ambulatory Visit: Payer: Managed Care, Other (non HMO) | Admitting: Clinical

## 2017-12-04 ENCOUNTER — Ambulatory Visit: Payer: 59 | Admitting: Psychology

## 2017-12-14 ENCOUNTER — Ambulatory Visit: Payer: 59 | Admitting: Clinical

## 2017-12-27 ENCOUNTER — Ambulatory Visit
Admission: RE | Admit: 2017-12-27 | Discharge: 2017-12-27 | Disposition: A | Discharge status: Home / Self Care | Payer: Managed Care, Other (non HMO) | Source: Ambulatory Visit | Attending: Family Medicine | Admitting: Family Medicine

## 2017-12-27 DIAGNOSIS — Z1231 Encounter for screening mammogram for malignant neoplasm of breast: Secondary | ICD-10-CM

## 2018-03-05 ENCOUNTER — Ambulatory Visit: Payer: 59 | Admitting: Clinical

## 2018-03-05 DIAGNOSIS — F419 Anxiety disorder, unspecified: Secondary | ICD-10-CM

## 2018-03-19 ENCOUNTER — Ambulatory Visit: Payer: Managed Care, Other (non HMO) | Admitting: Physician Assistant

## 2018-03-19 ENCOUNTER — Encounter: Payer: Self-pay | Admitting: Physician Assistant

## 2018-03-19 VITALS — BP 120/78 | HR 81 | Temp 98.4°F | Ht 62.0 in | Wt 155.2 lb

## 2018-03-19 DIAGNOSIS — J029 Acute pharyngitis, unspecified: Secondary | ICD-10-CM | POA: Diagnosis not present

## 2018-03-19 DIAGNOSIS — R8290 Unspecified abnormal findings in urine: Secondary | ICD-10-CM | POA: Diagnosis not present

## 2018-03-19 DIAGNOSIS — R5383 Other fatigue: Secondary | ICD-10-CM | POA: Diagnosis not present

## 2018-03-19 LAB — POCT URINALYSIS DIPSTICK
Bilirubin, UA: NEGATIVE
Blood, UA: NEGATIVE
Glucose, UA: NEGATIVE
Ketones, UA: NEGATIVE
Leukocytes, UA: NEGATIVE
Nitrite, UA: NEGATIVE
Protein, UA: NEGATIVE
Spec Grav, UA: 1.015 (ref 1.010–1.025)
Urobilinogen, UA: 0.2 E.U./dL
pH, UA: 7.5 (ref 5.0–8.0)

## 2018-03-19 LAB — CBC WITH DIFFERENTIAL/PLATELET
Basophils Absolute: 0 10*3/uL (ref 0.0–0.1)
Basophils Relative: 0.5 % (ref 0.0–3.0)
Eosinophils Absolute: 0 10*3/uL (ref 0.0–0.7)
Eosinophils Relative: 0.5 % (ref 0.0–5.0)
HCT: 39.6 % (ref 36.0–46.0)
Hemoglobin: 13.5 g/dL (ref 12.0–15.0)
Lymphocytes Relative: 34.8 % (ref 12.0–46.0)
Lymphs Abs: 2.7 10*3/uL (ref 0.7–4.0)
MCHC: 34.1 g/dL (ref 30.0–36.0)
MCV: 93.1 fl (ref 78.0–100.0)
Monocytes Absolute: 0.5 10*3/uL (ref 0.1–1.0)
Monocytes Relative: 6.6 % (ref 3.0–12.0)
Neutro Abs: 4.5 10*3/uL (ref 1.4–7.7)
Neutrophils Relative %: 57.6 % (ref 43.0–77.0)
Platelets: 250 10*3/uL (ref 150.0–400.0)
RBC: 4.25 Mil/uL (ref 3.87–5.11)
RDW: 12.3 % (ref 11.5–15.5)
WBC: 7.8 10*3/uL (ref 4.0–10.5)

## 2018-03-19 LAB — BASIC METABOLIC PANEL
BUN: 14 mg/dL (ref 6–23)
CO2: 29 mEq/L (ref 19–32)
Calcium: 9.5 mg/dL (ref 8.4–10.5)
Chloride: 100 mEq/L (ref 96–112)
Creatinine, Ser: 0.64 mg/dL (ref 0.40–1.20)
GFR: 105.29 mL/min (ref >60.00)
Glucose, Bld: 89 mg/dL (ref 70–99)
Potassium: 4.4 mEq/L (ref 3.5–5.1)
Sodium: 137 mEq/L (ref 135–145)

## 2018-03-19 LAB — TSH: TSH: 2.99 u[IU]/mL (ref 0.35–4.50)

## 2018-03-19 LAB — POCT RAPID STREP A (OFFICE): Rapid Strep A Screen: NEGATIVE

## 2018-03-19 NOTE — Progress Notes (Signed)
Jackie [name] is a 47 y.o. female here for a new problem.  I acted as a Neurosurgeon for [employer], PA-C Corky Mull, LPN  History of Present Illness:   Chief Complaint  Patient presents with  . Sore Throat    Sore Throat   This is a new problem. Episode onset: Started on Saturday. The problem has been gradually worsening. The pain is worse on the left side. The pain is at a severity of 6/10. The pain is moderate. Associated symptoms include swollen glands and trouble swallowing. Pertinent negatives include no coughing, ear pain, headaches, neck pain, shortness of breath or vomiting. Associated symptoms comments: Noticed blister left side of throat over the weekend. Chills off and on. Fatigue and nausea off and on. Loose stools. She has had no exposure to strep or mono. She has tried cool liquids and acetaminophen (Ibuprofen) for the symptoms. The treatment provided no relief.   Has felt exhausted and extreme sore throat over the weekend. Had subjective of fever over the weekend. Feels "engulfed in warmth." Appetite is fair -- has had toast and rice and tolerated well. Has been taking paracetamol and cepacol.   A week ago worked in the yard and got bitten by mosquitoes, and afterwards had flu-like symptoms. She states that it is normal for her to have flu-like symptoms after mosquito bites. Also reports that last week her urine was darker than normal and "smelled like it had ketones in it", however after she pushed fluids this resolved. She did not measure her temperature. She does endorse anxiety but states that her symptoms are relatively well controlled for her at this time.  Denies chest pain, SOB or any exertional symptoms.  Past Medical History:  Diagnosis Date  . Allergy    seasonal   . Anemia    during pregnancy  . Anemia, unspecified 04/27/2014  . Chicken pox 47 yrs old  . Dysmenorrhea 10/20/2011  . Endometriosis 12/22/2012  . Fibrocystic breast 07/22/2011  . Hair loss  07/03/2014  . Headache(784.0)    otc med prn  . 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 Hyponatremia 12/22/2012  . Low back pain 10/26/2014  . Lymphadenopathy 08/02/2015  . Miliaria   . Missed abortion    resolved on it's own - no surgery required.  . Pleurisy 08/29/2011   History - resolved  . Preventative health care 03/07/2013  . SVD (spontaneous vaginal delivery)    x 1  . Thyroid dysfunction 07/22/2011   history - resolved, no current problem  . Tinea versicolor 03/07/2013  . Vaginitis 07/15/2012  . Vitamin D deficiency 2012     Social History   Socioeconomic History  . Marital status: Married    Spouse name: Not on file  . Number of children: 1  . Years of education: Not on file  . Highest education level: Not on file  Occupational History  . Not on file  Social Needs  . Financial resource strain: Not on file  . Food insecurity:    Worry: Not on file    Inability: Not on file  . Transportation needs:    Medical: Not on file    Non-medical: Not on file  Tobacco Use  . Smoking status: Never Smoker  . Smokeless tobacco: Never Used  Substance and Sexual Activity  . Alcohol use: Yes    Comment: 1 to 2 glasses 5 times a week. but then the next week maybe nothing  . Drug use: No  .  Sexual activity: Yes    Partners: Male    Birth control/protection: None  Lifestyle  . Physical activity:    Days per week: Not on file    Minutes per session: Not on file  . Stress: Not on file  Relationships  . Social connections:    Talks on phone: Not on file    Gets together: Not on file    Attends religious service: Not on file    Active member of club or organization: Not on file    Attends meetings of clubs or organizations: Not on file    Relationship status: Not on file  . Intimate partner violence:    Fear of current or ex partner: Not on file    Emotionally abused: Not on file    Physically abused: Not on file    Forced sexual activity: Not on file   Other Topics Concern  . Not on file  Social History Narrative   LIves with husband.  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    Caffeine  2-3 cups.  Education:  Some college.  Working: Teacher, English as a foreign languagecontract driving, Sports administratorBER driver.     Past Surgical History:  Procedure Laterality Date  . DENTAL SURGERY  11/2013  . LAPAROSCOPIC SUPRACERVICAL HYSTERECTOMY N/A 12/17/2013   Procedure: LAPAROSCOPIC  SUPRACERVICAL HYSTERECTOMY WITH BILATERAL SALPINGECTOMY;  Surgeon: Oliver PilaKathy W Richardson, MD;  Location: [facility];  Service: Gynecology;  Laterality: N/A;  2hrs OR time    Family History  Problem Relation Age of Onset  . Diabetes Mother        type 2  . Stroke Mother   . Osteoporosis Mother   . Heart disease Mother        angina  . Heart attack Father        MI in his 3650s  . Diabetes Father        type 2  . Obesity Sister   . Diabetes Sister   . Obesity Brother   . Diabetes Brother        type 2  . Aneurysm Maternal Grandmother   . Heart attack Maternal Grandfather   . Pneumonia Paternal Grandfather   . Hypertension Sister        induced  . Other Sister        anemic  . Anemia Sister   . Depression Sister     Allergies  Allergen Reactions  . Clindamycin/Lincomycin Other (See Comments)    GI upset  . Nizoral [Ketoconazole]     Burning and dry skin    Current Medications:   Current Outpatient Medications:  .  B Complex-C (B-COMPLEX WITH VITAMIN C) tablet, Take 1 tablet by mouth daily., Disp: , Rfl:  .  Flaxseed, Linseed, (FLAX SEED OIL PO), Take by mouth., Disp: , Rfl:  .  ibuprofen (ADVIL,MOTRIN) 200 MG tablet, Take 200 mg by mouth every 6 (six) hours as needed., Disp: , Rfl:  .  lactobacillus acidophilus (BACID) TABS tablet, Take 1 tablet by mouth daily. Reported on 03/30/2016, Disp: , Rfl:  .  loratadine (CLARITIN) 10 MG tablet, Take 10 mg by mouth daily as needed for allergies., Disp: , Rfl:  .  Magnesium 400 MG CAPS, Take 400 mg by mouth daily. Reported on 03/30/2016, Disp: , Rfl:  .  Multiple Vitamin  (MULTIVITAMIN) tablet, Take 1 tablet by mouth daily.  , Disp: , Rfl:  .  triamcinolone cream (KENALOG) 0.1 %, Apply 1 application topically 2 (two) times daily. (Patient taking differently: Apply 1  application topically 2 (two) times daily as needed. ), Disp: 30 g, Rfl: 0   Review of Systems:   Review of Systems  HENT: Positive for trouble swallowing. Negative for ear pain.   Respiratory: Negative for cough and shortness of breath.   Gastrointestinal: Negative for vomiting.  Musculoskeletal: Negative for neck pain.  Neurological: Negative for headaches.    Vitals:   Vitals:   03/19/18 1417  BP: 120/78  Pulse: 81  Temp: 98.4 F (36.9 C)  TempSrc: Oral  SpO2: 99%  Weight: 155 lb 4 oz (70.4 kg)  Height: 5\' 2"  (1.575 m)     Body mass index is 28.4 kg/m.  Physical Exam:   Physical Exam  Constitutional: She appears well-developed. She is cooperative.  Non-toxic appearance. She does not have a sickly appearance. She does not appear ill. No distress.  HENT:  Head: Normocephalic and atraumatic.  Right Ear: Tympanic membrane, external ear and ear canal normal. Tympanic membrane is not erythematous, not retracted and not bulging.  Left Ear: Tympanic membrane, external ear and ear canal normal. Tympanic membrane is not erythematous, not retracted and not bulging.  Nose: Nose normal. Right sinus exhibits no maxillary sinus tenderness and no frontal sinus tenderness. Left sinus exhibits no maxillary sinus tenderness and no frontal sinus tenderness.  Mouth/Throat: Uvula is midline, oropharynx is clear and moist and mucous membranes are normal. No posterior oropharyngeal edema or posterior oropharyngeal erythema. Tonsils are 1+ on the right. Tonsils are 1+ on the left. No tonsillar exudate.  Eyes: Conjunctivae and lids are normal.  Neck: Trachea normal.  Cardiovascular: Normal rate, regular rhythm, S1 normal, S2 normal and normal heart sounds.  Pulmonary/Chest: Effort normal and breath  sounds normal. She has no decreased breath sounds. She has no wheezes. She has no rhonchi. She has no rales.  Lymphadenopathy:    She has cervical adenopathy.  Neurological: She is alert.  Skin: Skin is warm, dry and intact.  Patient declined examination of skin, denies any current bug bites  Psychiatric: She has a normal mood and affect. Her speech is normal and behavior is normal.  Nursing note and vitals reviewed.  Results for orders placed or performed in visit on 03/19/18  POCT rapid strep A  Result Value Ref Range   Rapid Strep A Screen Negative Negative  POCT urinalysis dipstick  Result Value Ref Range   Color, UA yellow    Clarity, UA cloudy    Glucose, UA Negative Negative   Bilirubin, UA N    Ketones, UA N    Spec Grav, UA 1.015 1.010 - 1.025   Blood, UA N    pH, UA 7.5 5.0 - 8.0   Protein, UA Negative Negative   Urobilinogen, UA 0.2 0.2 or 1.0 E.U./dL   Nitrite, UA N    Leukocytes, UA Negative Negative   Appearance     Odor      Assessment and Plan:    Chanti was seen today for sore throat.  Diagnoses and all orders for this visit:  Pharyngitis, unspecified etiology Strep negative. No red flags on exam.  Labs today to r/o other cause and provide reassurance. Discussed supportive care, including Tylenol and Cepacol lozenges. Discussed taking medications as prescribed. Reviewed return precautions including worsening fever, SOB or other concerns. Push fluids and rest. I recommend that patient follow-up if symptoms worsen or persist despite treatment x 7-10 days, sooner if needed. -     CBC with Differential/Platelet -  Basic metabolic panel -     POCT rapid strep A -     Epstein-Barr virus VCA antibody panel -     Cancel: POCT rapid strep A  Abnormal urine Urine normal in office today. Suspect she experienced mild dehydration over the weekend. Continue to push fluids and follow-up if symptoms persist. -     POCT urinalysis dipstick  Fatigue, unspecified  type Suspect related to viral illness. Will check TSH as well as other labs. Recommend follow-up with PCP if symptoms persist or worsen. -     TSH  . Reviewed expectations re: course of current medical issues. . Discussed self-management of symptoms. . Outlined signs and symptoms indicating need for more acute intervention. . Patient verbalized understanding and all questions were answered. . See orders for this visit as documented in the electronic medical record. . Patient received an After-Visit Summary.  CMA or LPN served as scribe during this visit. History, Physical, and Plan performed by medical provider. Documentation and orders reviewed and attested to.   Jarold Motto, PA-C

## 2018-03-19 NOTE — Patient Instructions (Addendum)
It was great to see you!  We will call you with your lab results.  If your symptoms change or persist, please let us know!  Antibiotics are not needed for this.  Viral infections usually take 7-10 days to resolve.    Push fluids and get plenty of rest.  Please return if you are not improving as expected, or if you have high fevers (>101.5) or difficulty swallowing or worsening productive cough.  Call clinic with questions.  I hope you start feeling better soon!

## 2018-03-20 LAB — EPSTEIN-BARR VIRUS VCA ANTIBODY PANEL
EBV NA IgG: 101 U/mL — ABNORMAL HIGH
EBV VCA IgG: >750 U/mL — ABNORMAL HIGH
EBV VCA IgM: <36 U/mL

## 2018-03-21 ENCOUNTER — Ambulatory Visit: Payer: Self-pay

## 2018-03-21 NOTE — Telephone Encounter (Signed)
Please see message. °

## 2018-03-21 NOTE — Telephone Encounter (Signed)
See note.   Copied from CRM [id number]. Topic: Quick Communication - Lab Results >> Mar 21, 2018 12:09 PM Oneal GroutSebastian, Jennifer S wrote: Requesting lab results

## 2018-03-21 NOTE — Telephone Encounter (Signed)
Noted. Agree with plan and recommendations.  Jarold MottoSamantha Kaesha Kirsch PA-C 03/21/2018

## 2018-03-21 NOTE — Telephone Encounter (Signed)
See note

## 2018-03-21 NOTE — Telephone Encounter (Signed)
Phone call returned to pt. To discuss her symptoms.  Reported she had clammy feeling last night and had increased chills.  Did not check temperature.  Stated her throat continues to feel "sore and constricted."  Also described as a "mild burning" of the throat.  Able to swallow food/ fluids but it is sore to swallow.   Also reported she felt very drained by the end of the day on Tuesday.   Eating light, but stated she has not lost her appetite.  "I feels ichy, but not full on nausea."  Stated "I'm not severely ill, that I can't get out of bed, but just don't feel 100 %."    Denied tenderness in her face/ cheek area, but she feels "soreness" in her throat and clavicle area.  Denied any sinus drainage.  Reported some increased coughing that is dry; denied feeling chest congestion.  Denied any further diarrhea stools.  Advised to continue pushing fluids and getting plenty of rest, and that viral infection may last up to 7-10 days.  Will make Jackie [name] aware of update of her symptoms.  Pt. Questioned about her lab results; advised per MyChart message, of S. [name]'s result notes.  Verb. Understanding.

## 2018-03-21 NOTE — Telephone Encounter (Signed)
  Reason for Disposition . [1] Caller requesting NON-URGENT health information AND [2] PCP's office is the best resource    Pt. Called to report current symptoms, since seen in office on 03/19/18  Answer Assessment - Initial Assessment Questions 1. REASON FOR CALL or QUESTION: "What is your reason for calling today?" or "How can I best help you?" or "What question do you have that I can help answer?"      Pt. stated she wanted to give updated symptoms to [facility].  Protocols used: INFORMATION ONLY CALL-A-AH

## 2018-04-12 ENCOUNTER — Ambulatory Visit: Payer: 59 | Admitting: Clinical

## 2018-04-12 DIAGNOSIS — F419 Anxiety disorder, unspecified: Secondary | ICD-10-CM

## 2018-04-18 IMAGING — CR DG CHEST 2V
2 series · 2 of 2 positions shown · non-contrast
Comparison: 10/26/2016 chest radiograph.

CLINICAL DATA: 45 y/o  F; left-sided chest pain.

EXAM:
CHEST  2 VIEW

[w chest pa]
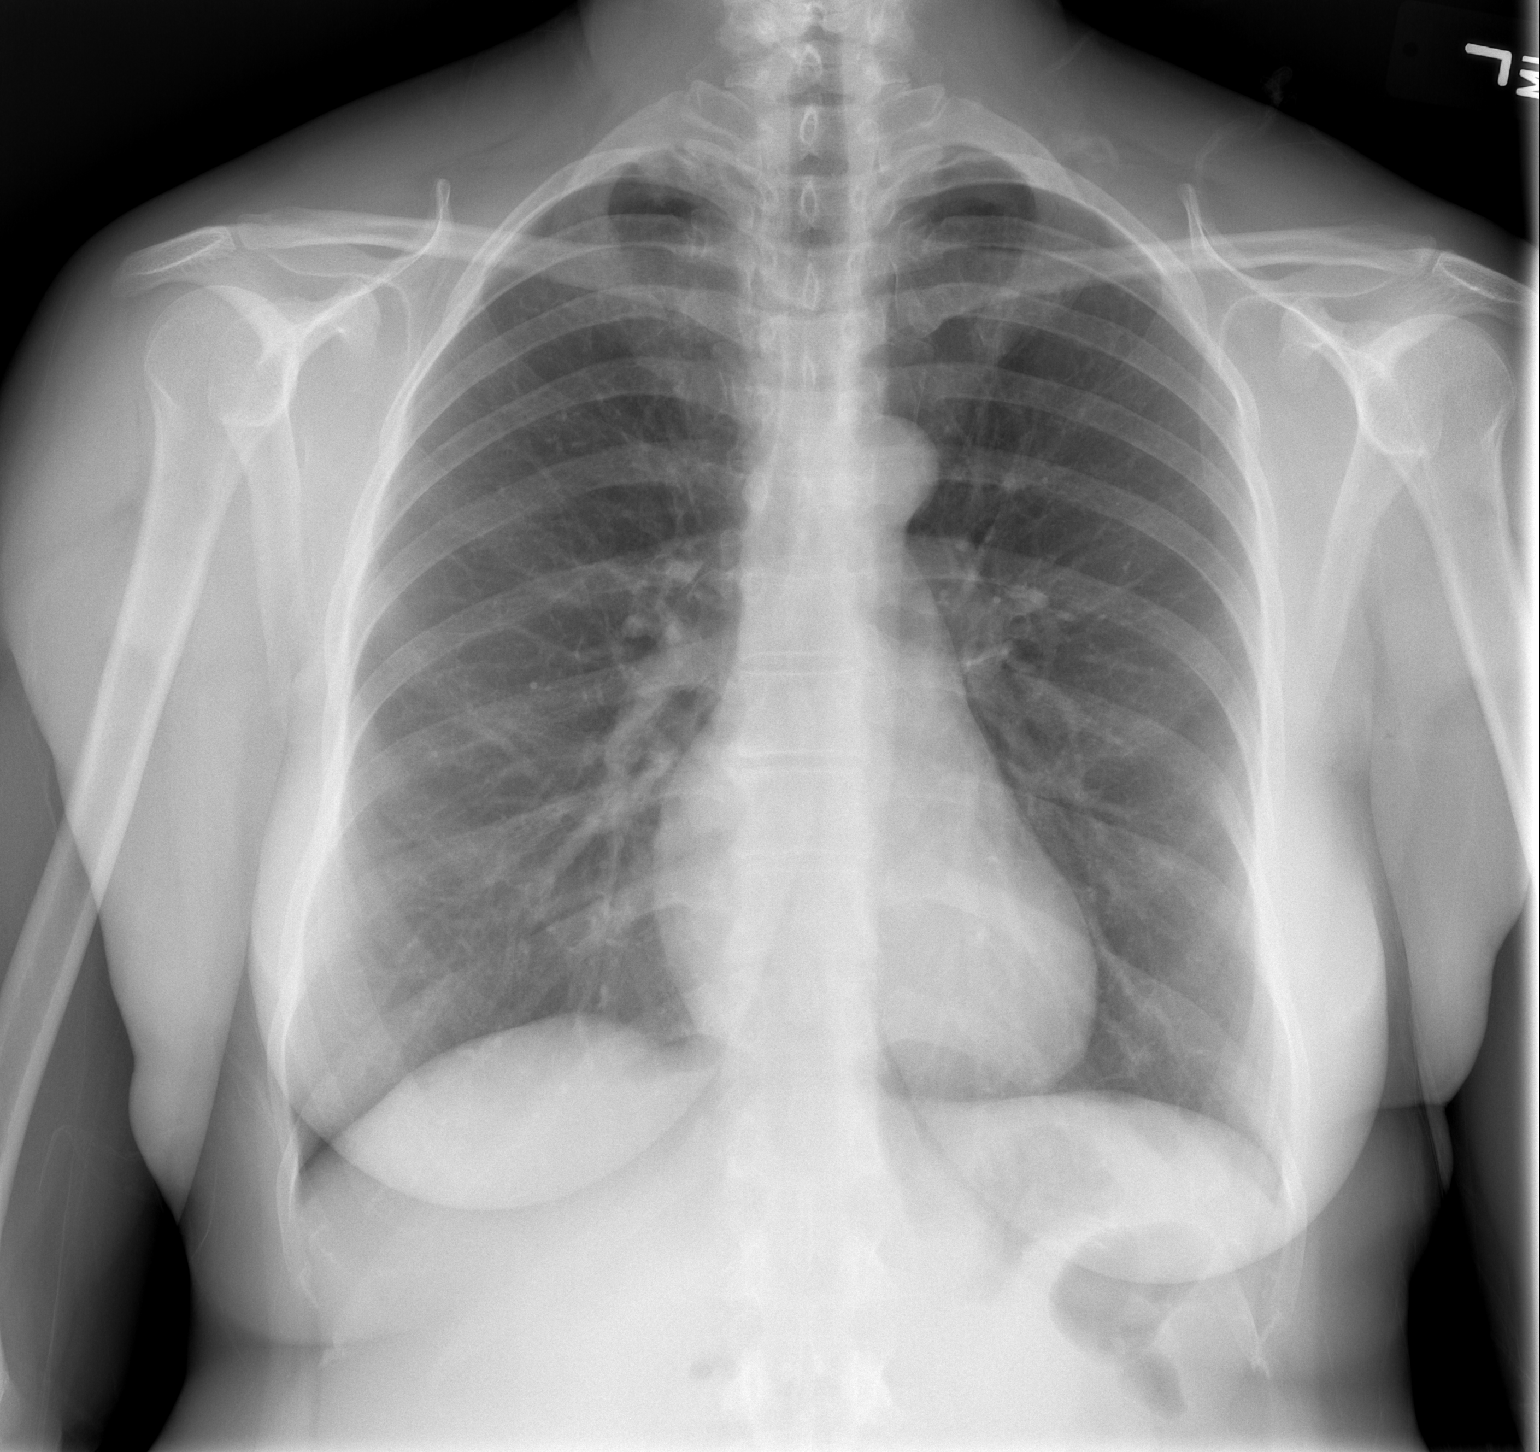

[w chest lat]
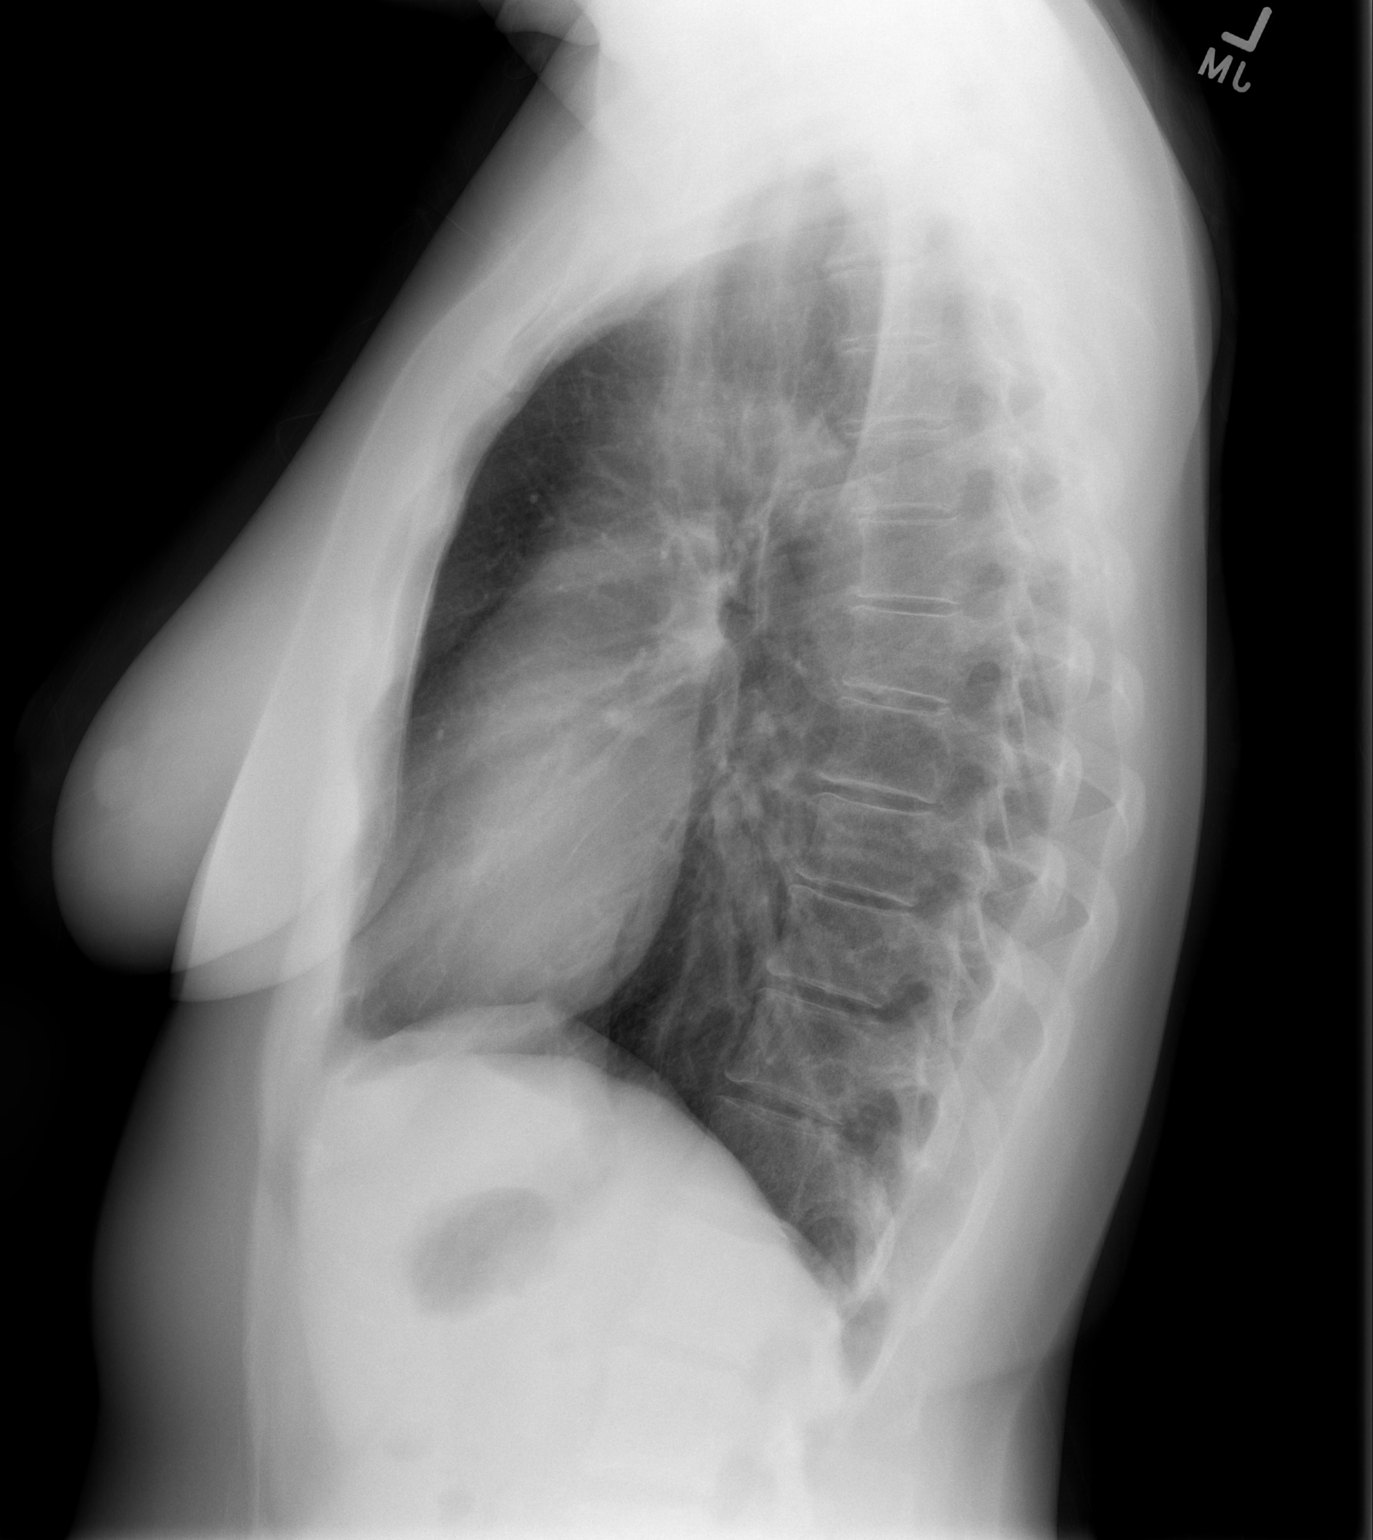

[2 of 2 positions shown; findings below may reference images not displayed]

FINDINGS: Stable heart size and mediastinal contours are within normal limits.
Both lungs are clear. The visualized skeletal structures are
unremarkable.
IMPRESSION: No active cardiopulmonary disease.

By: Ewa Jarek Aleksiejuk M.D.

## 2018-04-24 ENCOUNTER — Encounter: Payer: Self-pay | Admitting: Family Medicine

## 2018-04-24 ENCOUNTER — Ambulatory Visit: Payer: Managed Care, Other (non HMO) | Admitting: Family Medicine

## 2018-04-24 VITALS — BP 128/83 | HR 69 | Temp 97.7°F | Resp 20 | Ht 62.0 in | Wt 153.0 lb

## 2018-04-24 DIAGNOSIS — Z90710 Acquired absence of both cervix and uterus: Secondary | ICD-10-CM

## 2018-04-24 DIAGNOSIS — R070 Pain in throat: Secondary | ICD-10-CM

## 2018-04-24 DIAGNOSIS — N939 Abnormal uterine and vaginal bleeding, unspecified: Secondary | ICD-10-CM | POA: Diagnosis not present

## 2018-04-24 NOTE — Patient Instructions (Signed)
I have placed a referral to your gynecologist urgently. Vaginal bleeding with a hysterectomy is concerning.  I have also placed a referral to ENT (Dr. Pollyann Kennedyosen), to follow up on your throat discomfort. This is possibly reflux related, you could try over the counter prilosec 20 mg a day.     Gastroesophageal Reflux Disease, Adult Normally, food travels down the esophagus and stays in the stomach to be digested. If a person has gastroesophageal reflux disease (GERD), food and stomach acid move back up into the esophagus. When this happens, the esophagus becomes sore and swollen (inflamed). Over time, GERD can make small holes (ulcers) in the lining of the esophagus. Follow these instructions at home: Diet  Follow a diet as told by your doctor. You may need to avoid foods and drinks such as: ? Coffee and tea (with or without caffeine). ? Drinks that contain alcohol. ? Energy drinks and sports drinks. ? Carbonated drinks or sodas. ? Chocolate and cocoa. ? Peppermint and mint flavorings. ? Garlic and onions. ? Horseradish. ? Spicy and acidic foods, such as peppers, chili powder, curry powder, vinegar, hot sauces, and BBQ sauce. ? Citrus fruit juices and citrus fruits, such as oranges, lemons, and limes. ? Tomato-based foods, such as red sauce, chili, salsa, and pizza with red sauce. ? Fried and fatty foods, such as donuts, french fries, potato chips, and high-fat dressings. ? High-fat meats, such as hot dogs, rib eye steak, sausage, ham, and bacon. ? High-fat dairy items, such as whole milk, butter, and cream cheese.  Eat small meals often. Avoid eating large meals.  Avoid drinking large amounts of liquid with your meals.  Avoid eating meals during the 2-3 hours before bedtime.  Avoid lying down right after you eat.  Do not exercise right after you eat. General instructions  Pay attention to any changes in your symptoms.  Take over-the-counter and prescription medicines only as told  by your doctor. Do not take aspirin, ibuprofen, or other NSAIDs unless your doctor says it is okay.  Do not use any tobacco products, including cigarettes, chewing tobacco, and e-cigarettes. If you need help quitting, ask your doctor.  Wear loose clothes. Do not wear anything tight around your waist.  Raise (elevate) the head of your bed about 6 inches (15 cm).  Try to lower your stress. If you need help doing this, ask your doctor.  If you are overweight, lose an amount of weight that is healthy for you. Ask your doctor about a safe weight loss goal.  Keep all follow-up visits as told by your doctor. This is important. Contact a doctor if:  You have new symptoms.  You lose weight and you do not know why it is happening.  You have trouble swallowing, or it hurts to swallow.  You have wheezing or a cough that keeps happening.  Your symptoms do not get better with treatment.  You have a hoarse voice. Get help right away if:  You have pain in your arms, neck, jaw, teeth, or back.  You feel sweaty, dizzy, or light-headed.  You have chest pain or shortness of breath.  You throw up (vomit) and your throw up looks like blood or coffee grounds.  You pass out (faint).  Your poop (stool) is bloody or black.  You cannot swallow, drink, or eat. This information is not intended to replace advice given to you by your health care provider. Make sure you discuss any questions you have with your health care provider.  Document Released: 02/22/2008 Document Revised: 02/11/2016 Document Reviewed: 12/31/2014 Elsevier Interactive Patient Education  [employer].

## 2018-04-24 NOTE — Progress Notes (Signed)
Jackie FlackKausar [name] , 12-01-1970, 47 y.o., female MRN: [id number] Patient Care Team    Relationship Specialty Notifications Start End  Natalia LeatherwoodKuneff, [place] Wank A, DO PCP - General Family Medicine  05/16/16   Huel Coteichardson, Kathy, MD Consulting Physician Obstetrics and Gynecology  04/24/18     Chief Complaint  Patient presents with  . Sore Throat    treated for pharyngitis left side,     Subjective:   Sore throat: New problem to this provider.  Pt presents for an OV with complaints of throat discomfort.  Reports she was seen on March 19, 2018 at that time her throat felt rough, very fatigued and she had enlarged tonsils.  She was treated with an antibiotic and she feels it initially got better until this past weekend her symptoms return.  She occasionally will take Tylenol, and it helps with some of her symptoms.  Her throat feels raw with little bumps and swollen.  She was seen for similar symptoms last year and referred to ENT, and at that time they felt it was related to possible reflux.  Patient does not like to take medications and has not routinely taken medications for GERD.  She denies fever, chills, nausea, vomit, upper respiratory symptoms or cough.  She has also gained weight over the last few months secondary to consuming unhealthy options.    Vaginal bleeding:  Problem to this provider.  Patient reports she has had a few "twinges "abdomen.  She has noticed a dull pulling sensation on the left side that are similar to menstrual cramps. Past weekend she had noticed a little bit more cramps and a bloody vaginal discharge.  She has had a laparoscopic supracervical hysterectomy in March 2015.  She denies any heavy lifting or increase in activity.  She is eating and drinking well.  Bowel movements are unchanged.  She denies any dysuria or urinary frequency.  Depression screen PHQ 2/9 04/24/2018  Decreased Interest 0  Down, Depressed, Hopeless 0  PHQ - 2 Score 0  Altered sleeping 0  Tired, decreased  energy 1  Change in appetite 0  Feeling bad or failure about yourself  0  Trouble concentrating 0  Moving slowly or fidgety/restless 0  PHQ-9 Score 1  Difficult doing work/chores Not difficult at all    Allergies  Allergen Reactions  . Clindamycin/Lincomycin Other (See Comments)    GI upset  . Nizoral [Ketoconazole]     Burning and dry skin   Social History   Tobacco Use  . Smoking status: Never Smoker  . Smokeless tobacco: Never Used  Substance Use Topics  . Alcohol use: Yes    Comment: 1 to 2 glasses 5 times a week. but then the next week maybe nothing   Past Medical History:  Diagnosis Date  . Allergy    seasonal   . Anemia    during pregnancy  . Anemia, unspecified 04/27/2014  . Chicken pox 47 yrs old  . Dysmenorrhea 10/20/2011  . Endometriosis 12/22/2012  . Fibrocystic breast 07/22/2011  . Hair loss 07/03/2014  . Headache(784.0)    otc med prn  . 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 Hyponatremia 12/22/2012  . Low back pain 10/26/2014  . Lymphadenopathy 08/02/2015  . Miliaria   . Missed abortion    resolved on it's own - no surgery required.  . Pleurisy 08/29/2011   History - resolved  . Preventative health care 03/07/2013  . SVD (spontaneous vaginal delivery)    x 1  .  Thyroid dysfunction 07/22/2011   history - resolved, no current problem  . Tinea versicolor 03/07/2013  . Vaginitis 07/15/2012  . Vitamin D deficiency 2012   Past Surgical History:  Procedure Laterality Date  . DENTAL SURGERY  11/2013  . LAPAROSCOPIC SUPRACERVICAL HYSTERECTOMY N/A 12/17/2013   Procedure: LAPAROSCOPIC  SUPRACERVICAL HYSTERECTOMY WITH BILATERAL SALPINGECTOMY;  Surgeon: Oliver Pila, MD;  Location: [facility];  Service: Gynecology;  Laterality: N/A;  2hrs OR time   Family History  Problem Relation Age of Onset  . Diabetes Mother        type 2  . Stroke Mother   . Osteoporosis Mother   . Heart disease Mother        angina  . Heart attack Father        MI in  his 44s  . Diabetes Father        type 2  . Obesity Sister   . Diabetes Sister   . Obesity Brother   . Diabetes Brother        type 2  . Aneurysm Maternal Grandmother   . Heart attack Maternal Grandfather   . Pneumonia Paternal Grandfather   . Hypertension Sister        induced  . Other Sister        anemic  . Anemia Sister   . Depression Sister    Allergies as of 04/24/2018      Reactions   Clindamycin/lincomycin Other (See Comments)   GI upset   Nizoral [ketoconazole]    Burning and dry skin      Medication List        Accurate as of 04/24/18 11:59 PM. Always use your most recent med list.          B-complex with vitamin C tablet Take 1 tablet by mouth daily.   FLAX SEED OIL PO Take by mouth.   ibuprofen 200 MG tablet Commonly known as:  ADVIL,MOTRIN Take 200 mg by mouth every 6 (six) hours as needed.   lactobacillus acidophilus Tabs tablet Take 1 tablet by mouth daily. Reported on 03/30/2016   loratadine 10 MG tablet Commonly known as:  CLARITIN Take 10 mg by mouth daily as needed for allergies.   Magnesium 400 MG Caps Take 400 mg by mouth daily. Reported on 03/30/2016   multivitamin tablet Take 1 tablet by mouth daily.   triamcinolone cream 0.1 % Commonly known as:  KENALOG Apply 1 application topically 2 (two) times daily.       All past medical history, surgical history, allergies, family history, immunizations andmedications were updated in the EMR today and reviewed under the history and medication portions of their EMR.     ROS: Negative, with the exception of above mentioned in HPI   Objective:  BP 128/83 (BP Location: Right Arm, Patient Position: Sitting, Cuff Size: Normal)   Pulse 69   Temp 97.7 F (36.5 C)   Resp 20   Ht 5\' 2"  (1.575 m)   Wt 153 lb (69.4 kg)   LMP 12/03/2013   SpO2 99%   BMI 27.98 kg/m  Body mass index is 27.98 kg/m. Gen: Afebrile. No acute distress. Nontoxic in appearance, well developed, well nourished.    HENT: AT. [place].  MMM, 2 punctate red lesions roof of mouth. Bilateral nares without erythema, swelling or drainage. Throat without erythema or exudates.  Tonsils appear normal today.  No cough.  No hoarseness. Eyes:Pupils Equal Round Reactive to light, Extraocular movements intact,  Conjunctiva  without redness, discharge or icterus. Neck/lymp/endocrine: Supple, no lymphadenopathy, thyromegaly CV: RRR  Chest: CTAB, no wheeze or crackles. Good air movement, normal resp effort.  Abd: Soft.  Flat.ND.  Mild tenderness to palpation left lower quadrant.  BS present.  No rebound or guarding.  Neuro:  Normal gait. PERLA. EOMi. Alert. Oriented x3   No exam data present No results found. No results found for this or any previous visit (from the past 24 hour(s)).  Assessment/Plan: Waneta Fitting is a 47 y.o. female present for OV for  Vaginal bleeding/S/P hysterectomy -Vaginal bleeding of any type in a patient with a hysterectomy is concerning.  She has no other signs of concern for bladder or bowel causes for her discomfort.  Only suggest she be seen by her gynecologist urgently for further evaluation of vaginal bleeding with abdominal discomfort.  Referral placed back to her gynecologist was placed today to help coordinate urgent need.  Patient is agreeable with plan today. - Ambulatory referral to Gynecology urgently  Throat discomfort - discussed reflux with her at length today.  Her symptoms seem more consistent with reflux.  Possibly worsening secondary to weight gain and dietary changes.  Urged her to restart her diet, avoiding reflux triggers.  She has been supplied with a reflux sheet in the past.  She declines PPI or Zantac at this time.  She will think about it and consider picking it up over-the-counter.  She states she would rather have a ENT consider visualization by scope before starting a medication. - Ambulatory referral to ENT placed for her.   Reviewed expectations re: course of current  medical issues.  Discussed self-management of symptoms.  Outlined signs and symptoms indicating need for more acute intervention.  Patient verbalized understanding and all questions were answered.  Patient received an After-Visit Summary.   > 25 minutes spent with patient, >50% of time spent face to face counseling and/or coordinating care.    Orders Placed This Encounter  Procedures  . Ambulatory referral to Gynecology  . Ambulatory referral to ENT   Note is dictated utilizing voice recognition software. Although note has been proof read prior to signing, occasional typographical errors still can be missed. If any questions arise, please do not hesitate to call for verification.   electronically signed by:  Felix Pacini, DO  [facility] - OR

## 2018-04-26 ENCOUNTER — Encounter: Payer: Self-pay | Admitting: Family Medicine

## 2018-05-28 ENCOUNTER — Ambulatory Visit: Payer: 59 | Admitting: Clinical

## 2018-05-28 DIAGNOSIS — F419 Anxiety disorder, unspecified: Secondary | ICD-10-CM

## 2018-06-07 ENCOUNTER — Ambulatory Visit: Payer: 59 | Admitting: Clinical

## 2018-06-07 DIAGNOSIS — F419 Anxiety disorder, unspecified: Secondary | ICD-10-CM | POA: Diagnosis not present

## 2018-07-05 ENCOUNTER — Encounter: Payer: Self-pay | Admitting: Family Medicine

## 2018-07-05 ENCOUNTER — Ambulatory Visit: Payer: Managed Care, Other (non HMO) | Admitting: Family Medicine

## 2018-07-05 VITALS — BP 109/72 | HR 67 | Temp 98.1°F | Resp 20 | Ht 62.0 in | Wt 150.0 lb

## 2018-07-05 DIAGNOSIS — F419 Anxiety disorder, unspecified: Secondary | ICD-10-CM | POA: Diagnosis not present

## 2018-07-05 DIAGNOSIS — R519 Headache, unspecified: Secondary | ICD-10-CM

## 2018-07-05 DIAGNOSIS — R51 Headache: Secondary | ICD-10-CM

## 2018-07-05 DIAGNOSIS — R209 Unspecified disturbances of skin sensation: Secondary | ICD-10-CM

## 2018-07-05 DIAGNOSIS — E559 Vitamin D deficiency, unspecified: Secondary | ICD-10-CM

## 2018-07-05 DIAGNOSIS — R202 Paresthesia of skin: Secondary | ICD-10-CM

## 2018-07-05 LAB — VITAMIN D 25 HYDROXY (VIT D DEFICIENCY, FRACTURES): VITD: 24.63 ng/mL — ABNORMAL LOW (ref 30.00–100.00)

## 2018-07-05 LAB — SEDIMENTATION RATE: Sed Rate: 15 mm/hr (ref 0–20)

## 2018-07-05 LAB — VITAMIN B12: Vitamin B-12: 567 pg/mL (ref 211–911)

## 2018-07-05 NOTE — Patient Instructions (Signed)
We will make a plan after I get your lab results and talk about meds, mri or referral at that time.    Please help [place] help you:  We are honored you have chosen [facility] for your Primary Care home. Below you will find basic instructions that you may need to access in the future. Please help [place] help you by reading the instructions, which cover many of the frequent questions we experience.   Prescription refills and request:  -In order to allow more efficient response time, please call your pharmacy for all refills. They will forward the request electronically to [place]. This allows for the quickest possible response. Request left on a nurse line can take longer to refill, since these are checked as time allows between office patients and other phone calls.  - refill request can take up to 3-5 working days to complete.  - If request is sent electronically and request is appropiate, it is usually completed in 1-2 business days.  - all patients will need to be seen routinely for all chronic medical conditions requiring prescription medications (see follow-up below). If you are overdue for follow up on your condition, you will be asked to make an appointment and we will call in enough medication to cover you until your appointment (up to 30 days).  - all controlled substances will require a face to face visit to request/refill.  - if you desire your prescriptions to go through a new pharmacy, and have an active script at original pharmacy, you will need to call your pharmacy and have scripts transferred to new pharmacy. This is completed between the pharmacy locations and not by your provider.    Results: If any images or labs were ordered, it can take up to 1 week to get results depending on the test ordered and the lab/facility running and resulting the test. - Normal or stable results, which do not need further discussion, may be released to your mychart immediately with attached note to you. A  call may not be generated for normal results. Please make certain to sign up for mychart. If you have questions on how to activate your mychart you can call the front office.  - If your results need further discussion, our office will attempt to contact you via phone, and if unable to reach you after 2 attempts, we will release your abnormal result to your mychart with instructions.  - All results will be automatically released in mychart after 1 week.  - Your provider will provide you with explanation and instruction on all relevant material in your results. Please keep in mind, results and labs may appear confusing or abnormal to the untrained eye, but it does not mean they are actually abnormal for you personally. If you have any questions about your results that are not covered, or you desire more detailed explanation than what was provided, you should make an appointment with your provider to do so.   Our office handles many outgoing and incoming calls daily. If we have not contacted you within 1 week about your results, please check your mychart to see if there is a message first and if not, then contact our office.  In helping with this matter, you help decrease call volume, and therefore allow [place] to be able to respond to patients needs more efficiently.   Acute office visits (sick visit):  An acute visit is intended for a new problem and are scheduled in shorter time slots to  allow schedule openings for patients with new problems. This is the appropriate visit to discuss a new problem. Problems will not be addressed by phone call or Echart message. Appointment is needed if requesting treatment. In order to provide you with excellent quality medical care with proper time for you to explain your problem, have an exam and receive treatment with instructions, these appointments should be limited to one new problem per visit. If you experience a new problem, in which you desire to be addressed, please  make an acute office visit, we save openings on the schedule to accommodate you. Please do not save your new problem for any other type of visit, let us take care of it properly and quickly for you.   Follow up visits:  Depending on your condition(s) your provider will need to see you routinely in order to provide you with quality care and prescribe medication(s). Most chronic conditions (Example: hypertension, Diabetes, depression/anxiety... etc), require visits a couple times a year. Your provider will instruct you on proper follow up for your personal medical conditions and history. Please make certain to make follow up appointments for your condition as instructed. Failing to do so could result in lapse in your medication treatment/refills. If you request a refill, and are overdue to be seen on a condition, we will always provide you with a 30 day script (once) to allow you time to schedule.    Medicare wellness (well visit): - we have a wonderful Nurse Maudie Mercury), that will meet with you and provide you will yearly medicare wellness visits. These visits should occur yearly (can not be scheduled less than 1 calendar year apart) and cover preventive health, immunizations, advance directives and screenings you are entitled to yearly through your medicare benefits. Do not miss out on your entitled benefits, this is when medicare will pay for these benefits to be ordered for you.  These are strongly encouraged by your provider and is the appropriate type of visit to make certain you are up to date with all preventive health benefits. If you have not had your medicare wellness exam in the last 12 months, please make certain to schedule one by calling the office and schedule your medicare wellness with Maudie Mercury as soon as possible.   Yearly physical (well visit):  - Adults are recommended to be seen yearly for physicals. Check with your insurance and date of your last physical, most insurances require one calendar  year between physicals. Physicals include all preventive health topics, screenings, medical exam and labs that are appropriate for gender/age and history. You may have fasting labs needed at this visit. This is a well visit (not a sick visit), new problems should not be covered during this visit (see acute visit).  - Pediatric patients are seen more frequently when they are younger. Your provider will advise you on well child visit timing that is appropriate for your their age. - This is not a medicare wellness visit. Medicare wellness exams do not have an exam portion to the visit. Some medicare companies allow for a physical, some do not allow a yearly physical. If your medicare allows a yearly physical you can schedule the medicare wellness with our nurse Maudie Mercury and have your physical with your provider after, on the same day. Please check with insurance for your full benefits.   Late Policy/No Shows:  - all new patients should arrive 15-30 minutes earlier than appointment to allow [place] time  to  obtain all personal demographics,  insurance information and for you to complete office paperwork. - All established patients should arrive 10-15 minutes earlier than appointment time to update all information and be checked in .  - In our best efforts to run on time, if you are late for your appointment you will be asked to either reschedule or if able, we will work you back into the schedule. There will be a wait time to work you back in the schedule,  depending on availability.  - If you are unable to make it to your appointment as scheduled, please call 24 hours ahead of time to allow [place] to fill the time slot with someone else who needs to be seen. If you do not cancel your appointment ahead of time, you may be charged a no show fee.

## 2018-07-05 NOTE — Progress Notes (Signed)
Jackie [name] , [birth year], 47 y.o., female MRN: [id number] Patient Care Team    Relationship Specialty Notifications Start End  Natalia Leatherwood, DO PCP - General Family Medicine  05/16/16   Huel Cote, MD Consulting Physician Obstetrics and Gynecology  04/24/18     Chief Complaint  Patient presents with  . Headache    pain in left side of head and tingling in face right side started 2 days ago     Subjective: Pt presents for an OV with couple complaints.  Stages of scalp bilateral parietal areas and "trickling" feeling on her right face.  She reports when this occurs it is very quick in nature.  It is intermittent.  She does admit that it is more purulent when she has increased moments of anxiety.  She states 2 days ago she had a sharp stab lasted 1 to 2 seconds in the left side of her head that then started having a light throb in that location that lasted about 10 minutes.  She states she was driving at that time and did not feel the need to pull over.  That night she had some mild nausea and light sensitivity that went away on its own.  She reports she had no visual changes, dizziness or nausea at the time of discomfort.  She goes on to say that her "whole left side feels weird often.  "  She was established with neurology, Dr. Anne Hahn, and they had ordered an MRI and nerve conduction testing which she decided not to have completed secondary to financial concerns. She is taking B12.  Her ANA in 2014 was negative.  Depression screen PHQ 2/9 04/24/2018  Decreased Interest 0  Down, Depressed, Hopeless 0  PHQ - 2 Score 0  Altered sleeping 0  Tired, decreased energy 1  Change in appetite 0  Feeling bad or failure about yourself  0  Trouble concentrating 0  Moving slowly or fidgety/restless 0  PHQ-9 Score 1  Difficult doing work/chores Not difficult at all    Allergies  Allergen Reactions  . Clindamycin/Lincomycin Other (See Comments)    GI upset  . Nizoral [Ketoconazole]    Burning and dry skin   Social History   Tobacco Use  . Smoking status: Never Smoker  . Smokeless tobacco: Never Used  Substance Use Topics  . Alcohol use: Yes    Comment: 1 to 2 glasses 5 times a week. but then the next week maybe nothing   Past Medical History:  Diagnosis Date  . Allergy    seasonal   . Anemia    during pregnancy  . Anemia, unspecified 04/27/2014  . Chicken pox 47 yrs old  . Dysmenorrhea 10/20/2011  . Endometriosis 12/22/2012  . Fibrocystic breast 07/22/2011  . Hair loss 07/03/2014  . Headache(784.0)    otc med prn  . 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 Hyponatremia 12/22/2012  . Low back pain 10/26/2014  . Lymphadenopathy 08/02/2015  . Miliaria   . Missed abortion    resolved on it's own - no surgery required.  . Pleurisy 08/29/2011   History - resolved  . Preventative health care 03/07/2013  . SVD (spontaneous vaginal delivery)    x 1  . Thyroid dysfunction 07/22/2011   history - resolved, no current problem  . Tinea versicolor 03/07/2013  . Vaginitis 07/15/2012  . Vitamin D deficiency 2012   Past Surgical History:  Procedure Laterality Date  . DENTAL SURGERY  11/2013  .  LAPAROSCOPIC SUPRACERVICAL HYSTERECTOMY N/A 12/17/2013   Procedure: LAPAROSCOPIC  SUPRACERVICAL HYSTERECTOMY WITH BILATERAL SALPINGECTOMY;  Surgeon: Oliver Pila, MD;  Location: [facility];  Service: Gynecology;  Laterality: N/A;  2hrs OR time   Family History  Problem Relation Age of Onset  . Diabetes Mother        type 2  . Stroke Mother   . Osteoporosis Mother   . Heart disease Mother        angina  . Heart attack Father        MI in his 27s  . Diabetes Father        type 2  . Obesity Sister   . Diabetes Sister   . Obesity Brother   . Diabetes Brother        type 2  . Aneurysm Maternal Grandmother   . Heart attack Maternal Grandfather   . Pneumonia Paternal Grandfather   . Hypertension Sister        induced  . Other Sister        anemic  . Anemia  Sister   . Depression Sister    Allergies as of 07/05/2018      Reactions   Clindamycin/lincomycin Other (See Comments)   GI upset   Nizoral [ketoconazole]    Burning and dry skin      Medication List        Accurate as of 07/05/18 11:39 AM. Always use your most recent med list.          B-complex with vitamin C tablet Take 1 tablet by mouth daily.   ibuprofen 200 MG tablet Commonly known as:  ADVIL,MOTRIN Take 200 mg by mouth every 6 (six) hours as needed.   lactobacillus acidophilus Tabs tablet Take 1 tablet by mouth daily. Reported on 03/30/2016   loratadine 10 MG tablet Commonly known as:  CLARITIN Take 10 mg by mouth daily as needed for allergies.   multivitamin tablet Take 1 tablet by mouth daily.   Omega 3 1000 MG Caps Take 1 capsule by mouth daily.   triamcinolone cream 0.1 % Commonly known as:  KENALOG Apply 1 application topically 2 (two) times daily.       All past medical history, surgical history, allergies, family history, immunizations andmedications were updated in the EMR today and reviewed under the history and medication portions of their EMR.     ROS: Negative, with the exception of above mentioned in HPI   Objective:  BP 109/72 (BP Location: Left Arm, Patient Position: Sitting, Cuff Size: Normal)   Pulse 67   Temp 98.1 F (36.7 C)   Resp 20   Ht 5\' 2"  (1.575 m)   Wt 150 lb (68 kg)   LMP 12/03/2013   SpO2 99%   BMI 27.44 kg/m  Body mass index is 27.44 kg/m. Gen: Afebrile. No acute distress. Nontoxic in appearance, well developed, well nourished.  HENT: AT. [place]. MMM, no oral lesions. Eyes:Pupils Equal Round Reactive to light, Extraocular movements intact,  Conjunctiva without redness, discharge or icterus. Neck/lymp/endocrine: Supple,no lymphadenopathy CV: RRR no murmur, noedema Chest: CTAB, no wheeze or crackles. Good air movement, normal resp effort.  MSK: cervical spine without erythema, no bony tenderness, no soft tissue  changes.  Full range of motion.  Neurovascularly intact distally. Skin: no rashes, purpura or petechiae. No dry skin or scalp changes Neuro:  Normal gait. PERLA. EOMi. Alert. Oriented x3 Cranial nerves II through XII intact. Muscle strength 5/5 bilateral U/L E extremity.  Normal  touch sensation of scalp and bilateral sides of the face over forehead, cheek, and mandible.  She did feel that she had mildly decreased temperature sensation on the right. Psych: moderately anxious personality. Otherwise, Normal affect, dress and demeanor. Normal speech. Normal thought content and judgment.  No exam data present No results found. No results found for this or any previous visit (from the past 24 hour(s)).  Assessment/Plan: Daisha Filosa is a 47 y.o. female present for OV for  Vitamin D deficiency - Vitamin D (25 hydroxy) Anxiety disorder, unspecified type Patient admits that she does have some anxiety today, this is a huge move for her.  She has been struggling with anxiety for long time.  She also endorses that she would likely benefit from a medication.  Discussed many different medications with her today.  She agreed to medication start of Lexapro if her labs do not indicate other cause for her symptoms.  On call back if still desires medication will call and Lexapro 10 mg daily.  And follow-up plan will be discussed. -Continue with psychology  Facial paresthesia/Nonintractable headache, unspecified chronicity pattern, unspecified headache type Her symptoms are likely from her anxiety, however cannot be certain without nerve conduction or imaging, which she declined when neurology offered, secondary to financial concerns.  Discussed treatment options with her today and she agreed with recommended approach and would like to see if her labs indicate cause of her symptoms, consider anxiety medication and if none of the above are effective, and referral to neurology will be placed for her to have studies  they wanted to initiate.   -Continue B12 supplementation. - B12 - Sedimentation rate TSH recently tested and normal ANA negative in the past - no focal deficits or red flags  F/U dependent upon labs and if medication is decided upon.    Reviewed expectations re: course of current medical issues.  Discussed self-management of symptoms.  Outlined signs and symptoms indicating need for more acute intervention.  Patient verbalized understanding and all questions were answered.  Patient received an After-Visit Summary.    No orders of the defined types were placed in this encounter.  > 25 minutes spent with patient, >50% of time spent face to face counseling  Note is dictated utilizing voice recognition software. Although note has been proof read prior to signing, occasional typographical errors still can be missed. If any questions arise, please do not hesitate to call for verification.   electronically signed by:  Felix Pacini, DO  [facility] - OR

## 2018-07-06 ENCOUNTER — Telehealth: Payer: Self-pay | Admitting: Family Medicine

## 2018-07-06 ENCOUNTER — Encounter: Payer: Self-pay | Admitting: Family Medicine

## 2018-07-06 MED ORDER — ESCITALOPRAM OXALATE 10 MG PO TABS: ORAL_TABLET | ORAL | 0 refills | Status: DC

## 2018-07-06 NOTE — Telephone Encounter (Signed)
Left detailed message with results and instructions on patient voice mail per DPR 

## 2018-07-06 NOTE — Telephone Encounter (Signed)
Please inform patient the following information: - her vit d is low. I have called in a once a week prescribed supplement for 12 weeks. She should also take 1000u of vit d daily with food now and continue daily even after prescribed med to keep her levels normal and maintain bone health. Her b12 is midrange- make sure to take daily.  - the inflammatory marker is normal. Which is reassuring with her symptoms.  - If she would like to start a medication for her anxiety, as she discussed, I recommend daily lexapro at low dose. Start at 1/2 tab for 7 days, and then increase to 1 tab daily until she follow ups. I have called this in for her. Follow up in 2 months after starting med, and we can adjust dose if needing more coverage then.  If symptoms still present after helping with anxiety and getting her vit d level in normal range, then we can consider other options

## 2018-07-09 MED ORDER — ERGOCALCIFEROL 1.25 MG (50000 UT) PO CAPS: 50000.0000 [IU] | ORAL_CAPSULE | ORAL | 0 refills | Status: DC

## 2018-07-09 NOTE — Telephone Encounter (Signed)
Left detailed message with instructions on patient voice mail.

## 2018-07-09 NOTE — Addendum Note (Signed)
Addended by: Thomasena Edis on: 07/09/2018 01:51 PM   Modules accepted: Orders

## 2018-07-09 NOTE — Telephone Encounter (Signed)
Pt given information per notes of Dr. Claiborne Billings on 07/06/18.Pt verbalized understanding. Pt received voicemail message with same recommendations and notes that the 12 week prescription for Vit D was not sent in to her pharmacy but she did receive the prescription for Lexapro. Pt would also like to know if she is supposed to take Vit D 1000u daily with the prescription for Vit D or is she needs to take 1000u daily after the 12 week prescription is completed.

## 2018-07-18 ENCOUNTER — Ambulatory Visit: Payer: Managed Care, Other (non HMO) | Admitting: Clinical

## 2018-07-18 ENCOUNTER — Ambulatory Visit: Payer: 59 | Admitting: Clinical

## 2018-09-28 ENCOUNTER — Other Ambulatory Visit: Payer: Self-pay

## 2018-09-28 MED ORDER — ERGOCALCIFEROL 1.25 MG (50000 UT) PO CAPS: 50000.0000 [IU] | ORAL_CAPSULE | ORAL | 0 refills | Status: DC

## 2018-09-28 NOTE — Telephone Encounter (Signed)
Pt pharamacy calls requesting a rf on pt Vitamin D. Will rf w 3 additional rfs.

## 2018-11-29 ENCOUNTER — Other Ambulatory Visit: Payer: Self-pay | Admitting: Family Medicine

## 2018-11-29 DIAGNOSIS — Z1231 Encounter for screening mammogram for malignant neoplasm of breast: Secondary | ICD-10-CM

## 2018-12-31 ENCOUNTER — Ambulatory Visit: Payer: Self-pay

## 2019-02-05 ENCOUNTER — Other Ambulatory Visit: Payer: Self-pay

## 2019-02-05 ENCOUNTER — Encounter: Payer: Self-pay | Admitting: Family Medicine

## 2019-02-05 ENCOUNTER — Ambulatory Visit (INDEPENDENT_AMBULATORY_CARE_PROVIDER_SITE_OTHER): Payer: Managed Care, Other (non HMO) | Admitting: Family Medicine

## 2019-02-05 VITALS — Temp 97.3°F | Ht 62.0 in

## 2019-02-05 DIAGNOSIS — F419 Anxiety disorder, unspecified: Secondary | ICD-10-CM | POA: Diagnosis not present

## 2019-02-05 DIAGNOSIS — R5383 Other fatigue: Secondary | ICD-10-CM

## 2019-02-05 DIAGNOSIS — E559 Vitamin D deficiency, unspecified: Secondary | ICD-10-CM | POA: Diagnosis not present

## 2019-02-05 DIAGNOSIS — J029 Acute pharyngitis, unspecified: Secondary | ICD-10-CM | POA: Diagnosis not present

## 2019-02-05 MED ORDER — OMEPRAZOLE 40 MG PO CPDR
40.0000 mg | DELAYED_RELEASE_CAPSULE | Freq: Every day | ORAL | 3 refills | Status: DC
Start: 2019-02-05 — End: 2019-05-03

## 2019-02-05 MED ORDER — HYDROXYZINE HCL 10 MG PO TABS: 10.0000 mg | ORAL_TABLET | Freq: Three times a day (TID) | ORAL | 0 refills | Status: DC | PRN

## 2019-02-05 NOTE — Progress Notes (Signed)
VIRTUAL VISIT VIA VIDEO  I connected with Jackie [name] on 02/05/19 at 10:20 AM EDT by a video enabled telemedicine application and verified that I am speaking with the correct person using two identifiers. Location patient: Home Location provider: [facility], Office Persons participating in the virtual visit: Patient, Dr. Claiborne Billings and R.Baker, LPN  I discussed the limitations of evaluation and management by telemedicine and the availability of in person appointments. The patient expressed understanding and agreed to proceed.   SUBJECTIVE Chief Complaint  Patient presents with  . Adenopathy    Pt returned from [place] trip March 7th and about two weeks later pt had swollen glands with fatigue and states she has not been the same since.   . Anxiety    Pt has had a sitaution that she needs to speak MD about. She has not been able to go to Psych. due to funds.     HPI: Jackie [name] is a 48 y.o. female present today for multiple issues.  Patient reports she traveled to the [place] in March.  She returned on March 7.  She states 2-3 weeks after her return she has felt fatigued and has noticed some swollen lymph glands, sore throat that is worse when talking or throat is dry.  Initially she also felt clammy, exhausted and had a stomach upset however those symptoms resolved.  She denies any fever or cough with above symptoms.  He reports she went to the [place] to help care for her mother who was ill at the time.  She returned home during COVID-19 and found that she had lost her job.  She still is in a poor living environment with her husband.  She endorses having her "normal "anxiety symptoms with atypical chest discomfort.  She states since her return from the [place] her relationship with her husband has worsened and now she is having more panic attacks even when hearing him in the house. She has a long standing history of anxiety. She has been resistant to medication start and last visit 6 months agreed  to lexapro 10 mg QD, she has stopped that medication and did not followup. She has been routinely speaking with psychology (Dr. Dewayne Hatch) - notes not available. She has not been able to recently follow with psych secondary to financial restraints.  Prior note: Pt presents for an OV with couple complaints.  Stages of scalp bilateral parietal areas and "trickling" feeling on her right face.  She reports when this occurs it is very quick in nature.  It is intermittent.  She does admit that it is more purulent when she has increased moments of anxiety.  She states 2 days ago she had a sharp stab lasted 1 to 2 seconds in the left side of her head that then started having a light throb in that location that lasted about 10 minutes.  She states she was driving at that time and did not feel the need to pull over.  That night she had some mild nausea and light sensitivity that went away on its own.  She reports she had no visual changes, dizziness or nausea at the time of discomfort.  She goes on to say that her "whole left side feels weird often.  "  She was established with neurology, Dr. Anne Hahn, and they had ordered an MRI and nerve conduction testing which she decided not to have completed secondary to financial concerns. She is taking B12.  Her ANA in 2014 was negative. ROS:  See pertinent positives and negatives per HPI.  Patient Active Problem List   Diagnosis Date Noted  . Paresthesia and pain of both upper extremities 05/05/2017  . Pain in left arm 02/07/2017  . Family distress 10/26/2016  . Lymphadenopathy 08/02/2015  . 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 Allergic rhinitis 05/10/2013  . Endometriosis 12/22/2012  . Eczema 12/22/2011  . Anxiety disorder 12/22/2011  . Ovarian cyst 10/20/2011  . Chest pain, atypical 08/24/2011  . Palpitations 08/05/2011  . Neck pain 08/03/2011  . Vitamin D deficiency 07/22/2011  . Thyroid dysfunction 07/22/2011    Social History   Tobacco Use   . Smoking status: Never Smoker  . Smokeless tobacco: Never Used  Substance Use Topics  . Alcohol use: Yes    Comment: 1 to 2 glasses 5 times a week. but then the next week maybe nothing    Current Outpatient Medications:  .  B Complex-C (B-COMPLEX WITH VITAMIN C) tablet, Take 1 tablet by mouth daily., Disp: , Rfl:  .  lactobacillus acidophilus (BACID) TABS tablet, Take 1 tablet by mouth daily. Reported on 03/30/2016, Disp: , Rfl:  .  loratadine (CLARITIN) 10 MG tablet, Take 10 mg by mouth daily as needed for allergies., Disp: , Rfl:  .  Multiple Vitamin (MULTIVITAMIN) tablet, Take 1 tablet by mouth daily.  , Disp: , Rfl:  .  Omega 3 1000 MG CAPS, Take 1 capsule by mouth daily., Disp: , Rfl:  .  triamcinolone cream (KENALOG) 0.1 %, Apply 1 application topically 2 (two) times daily. (Patient taking differently: Apply 1 application topically 2 (two) times daily as needed. ), Disp: 30 g, Rfl: 0 .  ergocalciferol (VITAMIN D2) 1.25 MG (50000 UT) capsule, Take 1 capsule (50,000 Units total) by mouth once a week. (Patient not taking: Reported on 02/05/2019), Disp: 12 capsule, Rfl: 0 .  escitalopram (LEXAPRO) 10 MG tablet, Take 0.5 tablets (5 mg total) by mouth daily for 7 days, THEN 1 tablet (10 mg total) daily., Disp: 87 tablet, Rfl: 0 .  hydrOXYzine (ATARAX/VISTARIL) 10 MG tablet, Take 1 tablet (10 mg total) by mouth 3 (three) times daily as needed., Disp: 90 tablet, Rfl: 0 .  omeprazole (PRILOSEC) 40 MG capsule, Take 1 capsule (40 mg total) by mouth daily., Disp: 30 capsule, Rfl: 3  Allergies  Allergen Reactions  . Clindamycin/Lincomycin Other (See Comments)    GI upset  . Nizoral [Ketoconazole]     Burning and dry skin    OBJECTIVE: Temp (!) 97.3 F (36.3 C) (Oral)   Ht  (1.575 m)   LMP 12/03/2013   BMI 27.44 kg/m  Gen: No acute distress. Nontoxic in appearance.  Female. HENT: AT. [place].  MMM.  Eyes:Pupils Equal Round Reactive to light, Extraocular movements intact,  Conjunctiva  without redness, discharge or icterus. Chest: Cough or shortness of breath not present on exam Skin: No rashes, purpura or petechiae.  Neuro:  Alert. Oriented x3  Psych: Normal affect, dress and demeanor. Normal speech.   Depression screen PHQ 2/9 04/24/2018  Decreased Interest 0  Down, Depressed, Hopeless 0  PHQ - 2 Score 0  Altered sleeping 0  Tired, decreased energy 1  Change in appetite 0  Feeling bad or failure about yourself  0  Trouble concentrating 0  Moving slowly or fidgety/restless 0  PHQ-9 Score 1  Difficult doing work/chores Not difficult at all   GAD 7 : Generalized Anxiety Score 02/05/2019  Nervous, Anxious, on Edge 3  Control/stop  worrying 0  Worry too much - different things 0  Trouble relaxing 3  Restless 0  Easily annoyed or irritable 0  Afraid - awful might happen 3  Total GAD 7 Score 9  Anxiety Difficulty Somewhat difficult    ASSESSMENT AND PLAN: Jackie FlackKausar [name] is a 48 y.o. female present for  Vitamin D deficiency - poss cause of increased fatigue. - h/o vit d def- not taking supplement.  - Vitamin D (25 hydroxy); Future  Fatigue/Sore throat Chronic fatigue and likely multifactorial. Her recent set of symptoms could be d/t allergies, anxiety/depression or reflux. - reassured her COVID 19 is less likely given her self quarantine since returning midmarch. She has never had cough or fever.  - CBC w/Diff; Future - TSH; Future - vistaril 10 mg TID for anxiety and allergy benefits.   Anxiety: - Discussed in length with patient she should attempt to get out of the house daily- even if for a ride in her car.  - She should speak with a therapist- provided her information to [facility]. She was appreciative. She has a tough situation and needs resources provided by a professional trained in therapy.  - Discussed vistaril use for allergies and help with panic feelings.  - She does not want to start daily SSRI/SNRI.  Greater than 40  minutes spent with patient, >50% of time spent face to face counseling   Felix PaciniRenee Charlissa Petros, DO 02/05/2019

## 2019-02-05 NOTE — Patient Instructions (Signed)
Panic Attack  A panic attack is when you suddenly feel very afraid, uncomfortable, or nervous (anxious). A panic attack can happen when you are scared or for no reason. A panic attack can feel like a serious problem. It can even feel like a heart attack or stroke. See your doctor when you have a panic attack to make sure you do not have a serious problem. Follow these instructions at home:  Take medicines only as told by your doctor.  If you feel worried or nervous, try not to have caffeine.  Take good care of your health. To do this: ? Eat healthy. Make sure to eat fresh fruits and vegetables, whole grains, lean meats, and low-fat dairy. ? Get enough sleep. Try to sleep for 7-8 hours each night. ? Exercise. Try to be active for 30 minutes 5 or more days a week. ? Do not smoke. Talk to your doctor if you need help quitting. ? Limit how much alcohol you drink:  If you are a woman who is not pregnant: try not to have more than 1 drink a day.  If you are a man: try not to have more than 2 drinks a day.  One drink equals 12 oz of beer, 5 oz of wine, or 1 oz of hard liquor.  Keep all follow-up visits as told by your doctor. This is important. Contact a doctor if:  Your symptoms do not get better.  Your symptoms get worse.  You are not able to take your medicines as told. Get help right away if:  You have thoughts of hurting yourself or others.  You have symptoms of a panic attack. Do not drive yourself to the hospital. Have someone else drive you or call an ambulance. If you feel like you may hurt yourself or others, or have thoughts about taking your own life, get help right away. You can go to your nearest emergency department or call:  Your local emergency services (911 in the [place]  A suicide crisis helpline, such as the National Suicide Prevention Lifeline at 479 337 28751-423-059-4068. This is open 24 hours a day. Summary  A panic attack is when you suddenly feel very afraid,  uncomfortable, or nervous (anxious).  See your doctor when you have a panic attack to make sure that you do not have another serious problem.  If you feel like you may hurt yourself or others, get help right away by calling 911. This information is not intended to replace advice given to you by your health care provider. Make sure you discuss any questions you have with your health care provider. Document Released: 10/08/2010 Document Revised: 10/19/2016 Document Reviewed: 10/19/2016 Elsevier Interactive Patient Education  2019 Elsevier Inc. Major Depressive Disorder, Adult Major depressive disorder (MDD) is a mental health condition. MDD often makes you feel sad, hopeless, or helpless. MDD can also cause symptoms in your body. MDD can affect your:  Work.  School.  Relationships.  Other normal activities. MDD can range from mild to very bad. It may occur once (single episode MDD). It can also occur many times (recurrent MDD). The main symptoms of MDD often include:  Feeling sad, depressed, or irritable most of the time.  Loss of interest. MDD symptoms also include:  Sleeping too much or too little.  Eating too much or too little.  A change in your weight.  Feeling tired (fatigue) or having low energy.  Feeling worthless.  Feeling guilty.  Trouble making decisions.  Trouble thinking clearly.  Thoughts of suicide or harming others.  Feeling weak.  Feeling agitated.  Keeping yourself from being around other people (isolation). Follow these instructions at home: Activity  Do these things as told by your doctor: ? Go back to your normal activities. ? Exercise regularly. ? Spend time outdoors. Alcohol  Talk with your doctor about how alcohol can affect your antidepressant medicines.  Do not drink alcohol. Or, limit how much alcohol you drink. ? This means no more than 1 drink a day for nonpregnant women and 2 drinks a day for men. One drink equals one of these:   12 oz of beer.  5 oz of wine.  1 oz of hard liquor. General instructions  Take over-the-counter and prescription medicines only as told by your doctor.  Eat a healthy diet.  Get plenty of sleep.  Find activities that you enjoy. Make time to do them.  Think about joining a support group. Your doctor may be able to suggest a group for you.  Keep all follow-up visits as told by your doctor. This is important. Where to find more information:  The [employer] on Mental Illness: ? www.nami.org  [place] [employer] of Mental Health: ? http://www.maynard.net/  National Suicide Prevention Lifeline: ? (212) 492-4772. This is free, 24-hour help. Contact a doctor if:  Your symptoms get worse.  You have new symptoms. Get help right away if:  You self-harm.  You see, hear, taste, smell, or feel things that are not present (hallucinate). If you ever feel like you may hurt yourself or others, or have thoughts about taking your own life, get help right away. You can go to your nearest emergency department or call:  Your local emergency services (911 in the [place]  A suicide crisis helpline, such as the National Suicide Prevention Lifeline: ? 574-684-0902. This is open 24 hours a day. This information is not intended to replace advice given to you by your health care provider. Make sure you discuss any questions you have with your health care provider. Document Released: 08/17/2015 Document Revised: 05/22/2016 Document Reviewed: 05/22/2016 Elsevier Interactive Patient Education  2019 [employer].

## 2019-02-12 ENCOUNTER — Ambulatory Visit: Payer: Managed Care, Other (non HMO)

## 2019-02-18 ENCOUNTER — Telehealth: Payer: Self-pay | Admitting: Family Medicine

## 2019-02-18 ENCOUNTER — Other Ambulatory Visit: Payer: Self-pay

## 2019-02-18 ENCOUNTER — Ambulatory Visit (INDEPENDENT_AMBULATORY_CARE_PROVIDER_SITE_OTHER): Payer: Managed Care, Other (non HMO) | Admitting: Family Medicine

## 2019-02-18 DIAGNOSIS — E559 Vitamin D deficiency, unspecified: Secondary | ICD-10-CM

## 2019-02-18 DIAGNOSIS — R5383 Other fatigue: Secondary | ICD-10-CM

## 2019-02-18 DIAGNOSIS — J029 Acute pharyngitis, unspecified: Secondary | ICD-10-CM

## 2019-02-18 NOTE — Addendum Note (Signed)
Addended by: Genever Hentges M on: 02/18/2019 02:30 PM   Modules accepted: Orders  

## 2019-02-18 NOTE — Addendum Note (Signed)
Addended by: Manie Bealer M on: 02/18/2019 02:30 PM   Modules accepted: Orders  

## 2019-02-18 NOTE — Addendum Note (Signed)
Addended by: Brandilyn Nanninga M on: 02/18/2019 02:30 PM   Modules accepted: Orders  

## 2019-02-18 NOTE — Telephone Encounter (Signed)
Medication has not been filled in almost 2 years.  Patient will need to complete a virtual visit to address any concerns/need for medication.

## 2019-02-18 NOTE — Addendum Note (Signed)
Addended by: Eulah Pont on: 02/18/2019 02:30 PM   Modules accepted: Orders

## 2019-02-18 NOTE — Telephone Encounter (Signed)
Pt states she needs refill on triamcinolone but she doesn't want it to be a cream she would like it to be ointment. She uses [facility] in [address]  CB  [phone]

## 2019-02-19 ENCOUNTER — Telehealth: Payer: Self-pay | Admitting: Family Medicine

## 2019-02-19 LAB — CBC WITH DIFFERENTIAL/PLATELET
Absolute Monocytes: 578 cells/uL (ref 200–950)
Basophils Absolute: 30 cells/uL (ref 0–200)
Basophils Relative: 0.4 %
Eosinophils Absolute: 53 cells/uL (ref 15–500)
Eosinophils Relative: 0.7 %
HCT: 42 % (ref 35.0–45.0)
Hemoglobin: 14 g/dL (ref 11.7–15.5)
Lymphs Abs: 2873 cells/uL (ref 850–3900)
MCH: 31.7 pg (ref 27.0–33.0)
MCHC: 33.3 g/dL (ref 32.0–36.0)
MCV: 95 fL (ref 80.0–100.0)
MPV: 10.3 fL (ref 7.5–12.5)
Monocytes Relative: 7.7 %
Neutro Abs: 3968 cells/uL (ref 1500–7800)
Neutrophils Relative %: 52.9 %
Platelets: 271 10*3/uL (ref 140–400)
RBC: 4.42 10*6/uL (ref 3.80–5.10)
RDW: 12.2 % (ref 11.0–15.0)
Total Lymphocyte: 38.3 %
WBC: 7.5 10*3/uL (ref 3.8–10.8)

## 2019-02-19 LAB — TSH: TSH: 3.32 mIU/L

## 2019-02-19 LAB — VITAMIN D 25 HYDROXY (VIT D DEFICIENCY, FRACTURES): Vit D, 25-Hydroxy: 26 ng/mL — ABNORMAL LOW (ref 30–100)

## 2019-02-19 NOTE — Telephone Encounter (Signed)
Pt was called and message was left to return call  

## 2019-02-19 NOTE — Telephone Encounter (Signed)
This was sent and says incomplete, I am assuming it is in regards to her Labs/Low Vit D. Please advise.  thx

## 2019-02-19 NOTE — Telephone Encounter (Signed)
Please inform patient the following information: Her labs are normal with the exception of mildly low vit d. Start daily supplement of vit d 800-1000u daily with food.

## 2019-02-20 ENCOUNTER — Other Ambulatory Visit: Payer: Self-pay

## 2019-02-20 ENCOUNTER — Ambulatory Visit
Admission: RE | Admit: 2019-02-20 | Discharge: 2019-02-20 | Disposition: A | Discharge status: Home / Self Care | Payer: Managed Care, Other (non HMO) | Source: Ambulatory Visit | Attending: Family Medicine | Admitting: Family Medicine

## 2019-02-20 DIAGNOSIS — Z1231 Encounter for screening mammogram for malignant neoplasm of breast: Secondary | ICD-10-CM

## 2019-02-20 NOTE — Telephone Encounter (Signed)
My chart message sent to patient with results/instructions

## 2019-02-20 NOTE — Telephone Encounter (Signed)
Pt has mammogram results in "result notes", please give all results when pt returns call

## 2019-02-22 NOTE — Telephone Encounter (Signed)
VV sscheduled 6/8 with Dr. Claiborne Billings - patient aware

## 2019-02-25 ENCOUNTER — Telehealth: Payer: Self-pay | Admitting: General Practice

## 2019-02-25 ENCOUNTER — Encounter: Payer: Self-pay | Admitting: Family Medicine

## 2019-02-25 ENCOUNTER — Ambulatory Visit (INDEPENDENT_AMBULATORY_CARE_PROVIDER_SITE_OTHER): Payer: Managed Care, Other (non HMO) | Admitting: Family Medicine

## 2019-02-25 ENCOUNTER — Other Ambulatory Visit: Payer: Self-pay

## 2019-02-25 VITALS — Temp 97.9°F | Ht 62.0 in

## 2019-02-25 DIAGNOSIS — R05 Cough: Secondary | ICD-10-CM

## 2019-02-25 DIAGNOSIS — R922 Inconclusive mammogram: Secondary | ICD-10-CM

## 2019-02-25 DIAGNOSIS — R059 Cough, unspecified: Secondary | ICD-10-CM

## 2019-02-25 DIAGNOSIS — U071 COVID-19: Secondary | ICD-10-CM

## 2019-02-25 DIAGNOSIS — L309 Dermatitis, unspecified: Secondary | ICD-10-CM

## 2019-02-25 DIAGNOSIS — Z20822 Contact with and (suspected) exposure to covid-19: Secondary | ICD-10-CM

## 2019-02-25 MED ORDER — TRIAMCINOLONE ACETONIDE 0.025 % EX OINT: 1.0000 "application " | TOPICAL_OINTMENT | Freq: Two times a day (BID) | CUTANEOUS | 5 refills | Status: DC | PRN

## 2019-02-25 MED ORDER — AZITHROMYCIN 250 MG PO TABS: ORAL_TABLET | ORAL | 0 refills | Status: DC

## 2019-02-25 NOTE — Progress Notes (Signed)
VIRTUAL VISIT VIA VIDEO  I connected with Jackie FlackKausar Rozak on 02/25/19 at  1:30 PM EDT by a video enabled telemedicine application and verified that I am speaking with the correct person using two identifiers. Location patient: Home Location provider: [facility], Office Persons participating in the virtual visit: Patient, Dr. Claiborne BillingsKuneff and R.Baker, LPN  I discussed the limitations of evaluation and management by telemedicine and the availability of in person appointments. The patient expressed understanding and agreed to proceed.   SUBJECTIVE Chief Complaint  Patient presents with   Referral    Pt went to dermatology two years ago for cream and she would like a refill for this or referral to dermatology to continue getting this    Follow-up    Pt made appt to discuss mammogram results.     HPI: Jackie [name] is a 48 y.o. female present to follow up on her eczema.   Cough: Was seen 2 weeks ago with a mild cough, but reports that it is now restarting.  She is using the omeprazole, Vistaril and Flonase.  She has fever, chills, nausea or vomit.  She does endorse feeling a little lightheaded or queasy, along with cough, nasal congestion and fatigue.  Eczema, unspecified type Well controlled with analog ointment.  She prefers the ointment over the cream.  She has a current flare on her left forearm and the left and right side of her neck.  She has increased flares with stressful situations.   Normal mammogram/desne breast tissue:  Mm 3d Screen Breast Bilateral  Result Date: 02/20/2019 CLINICAL DATA:  Screening. EXAM: DIGITAL SCREENING BILATERAL MAMMOGRAM WITH TOMO AND CAD COMPARISON:  Previous exam(s). ACR Breast Density Category c: The breast tissue is heterogeneously dense, which may obscure small masses. FINDINGS: There are no findings suspicious for malignancy. Images were processed with CAD. IMPRESSION: No mammographic evidence of malignancy. A result letter of this screening  mammogram will be mailed directly to the patient. RECOMMENDATION: Screening mammogram in one year. (Code:SM-B-01Y) BI-RADS CATEGORY  1: Negative. Electronically Signed   By: Annia Beltrew  Davis M.D.   On: 02/20/2019 13:36   ROS: See pertinent positives and negatives per HPI.  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 but then the next week maybe nothing    Current Outpatient Medications:    B Complex-C (B-COMPLEX WITH VITAMIN C) tablet, Take 1 tablet by mouth daily., Disp: , Rfl:    hydrOXYzine (ATARAX/VISTARIL) 10 MG tablet, Take 1 tablet (10 mg total) by mouth 3 (three) times daily as needed., Disp: 90 tablet, Rfl: 0   lactobacillus acidophilus (BACID) TABS tablet, Take 1 tablet by mouth daily. Reported on 03/30/2016, Disp: , Rfl:    loratadine (CLARITIN)  10 MG tablet, Take 10 mg by mouth daily as needed for allergies., Disp: , Rfl:    Multiple Vitamin (MULTIVITAMIN) tablet, Take 1 tablet by mouth daily.  , Disp: , Rfl:    Omega 3 1000 MG CAPS, Take 1 capsule by mouth daily., Disp: , Rfl:    omeprazole (PRILOSEC) 40 MG capsule, Take 1 capsule (40 mg total) by mouth daily., Disp: 30 capsule, Rfl: 3   azithromycin (ZITHROMAX) 250 MG tablet, 500 mg day  1, then 250 mg QD, Disp: 6 tablet, Rfl: 0   escitalopram (LEXAPRO) 10 MG tablet, Take 0.5 tablets (5 mg total) by mouth daily for 7 days, THEN 1 tablet (10 mg total) daily., Disp: 87 tablet, Rfl: 0   triamcinolone (KENALOG) 0.025 % ointment, Apply 1 application topically 2 (two) times daily as needed., Disp: 30 g, Rfl: 5  Allergies  Allergen Reactions   Clindamycin/Lincomycin Other (See Comments)    GI upset   Nizoral [Ketoconazole]     Burning and dry skin    OBJECTIVE: Temp 97.9 F (36.6 C) (Oral)    Ht 5\' 2"  (1.575 m)    LMP 12/03/2013    BMI 27.44 kg/m  Gen: No acute distress. Nontoxic in appearance.  HENT: AT. [place].  MMM.  Eyes:Pupils Equal Round Reactive to light, Extraocular movements intact,  Conjunctiva without redness, discharge or icterus. Chest: Mild cough cough .no shortness of breath Skin: red flaky rash left forearm, No purpura or petechiae.  Neuro: . Alert. Oriented x3  Psych: Normal affect, dress and demeanor. Normal speech. Normal thought content and judgment.  ASSESSMENT AND PLAN: Jackie [name] is a 48 y.o. female present for  Eczema, unspecified type chronic eczema- current flare.  Kenalog ointment prescribed. She is not desiring to follow with derm at this time- controlled with ointment.   Dense breast tissue on mammogram Discussed her dense breast tissue- otherwise normal mammogram.  She reports chronic Crusty discharge from breast- worse after mammogram>> encouraged her to follow with her gyn for recommendations.   Cough: Rest, hydrate.  covid 19 test ordered today.  COVID-19 instructions provided again today. +/- flonase, mucinex (DM if cough), nettie pot or nasal saline.  azith start in 3 days only if symptoms not improving and covid 19 test negative.  If cough present it can last up to 6-8 weeks.  F/U 2 weeks of not improved.   > 25 minutes spent with patient, >50% of time spent face to face     Howard Pouch, DO 02/25/2019

## 2019-02-25 NOTE — Addendum Note (Signed)
Addended by: Denman George on: 02/25/2019 03:38 PM   Modules accepted: Orders

## 2019-02-25 NOTE — Telephone Encounter (Signed)
-----   Message from Caroll Rancher, LPN sent at 09/19/2160  1:53 PM EDT ----- Regarding: COVID-19 Jackie [name]  Dec 24, 1970  MRN [id number] Fatigue, Cough  [employer], Group # [id number]  Dr Howard Pouch

## 2019-02-25 NOTE — Telephone Encounter (Signed)
Pt has been scheduled for covid 19 testing, scheduled with pt directly.   Pt was referred by PCP Howard Pouch MD

## 2019-02-26 ENCOUNTER — Other Ambulatory Visit: Payer: Self-pay

## 2019-02-26 DIAGNOSIS — Z20822 Contact with and (suspected) exposure to covid-19: Secondary | ICD-10-CM

## 2019-02-27 ENCOUNTER — Other Ambulatory Visit: Payer: Self-pay | Admitting: Family Medicine

## 2019-02-28 LAB — NOVEL CORONAVIRUS, NAA: SARS-CoV-2, NAA: NOT DETECTED

## 2019-05-03 ENCOUNTER — Ambulatory Visit (INDEPENDENT_AMBULATORY_CARE_PROVIDER_SITE_OTHER): Payer: Managed Care, Other (non HMO) | Admitting: Family Medicine

## 2019-05-03 ENCOUNTER — Other Ambulatory Visit: Payer: Self-pay

## 2019-05-03 ENCOUNTER — Encounter: Payer: Self-pay | Admitting: Family Medicine

## 2019-05-03 VITALS — Ht 62.0 in

## 2019-05-03 DIAGNOSIS — M79602 Pain in left arm: Secondary | ICD-10-CM

## 2019-05-03 DIAGNOSIS — R202 Paresthesia of skin: Secondary | ICD-10-CM

## 2019-05-03 DIAGNOSIS — M542 Cervicalgia: Secondary | ICD-10-CM

## 2019-05-03 DIAGNOSIS — R51 Headache: Secondary | ICD-10-CM | POA: Diagnosis not present

## 2019-05-03 DIAGNOSIS — R519 Headache, unspecified: Secondary | ICD-10-CM

## 2019-05-03 DIAGNOSIS — M79601 Pain in right arm: Secondary | ICD-10-CM

## 2019-05-03 NOTE — Progress Notes (Signed)
VIRTUAL VISIT VIA VIDEO  I connected with Jackie [name] on 05/03/19 at  8:30 AM EDT by a video enabled telemedicine application and verified that I am speaking with the correct person using two identifiers. Location patient: Home Location provider: [facility], Office Persons participating in the virtual visit: Patient, Dr. Raoul Pitch and R.Baker, LPN  I discussed the limitations of evaluation and management by telemedicine and the availability of in person appointments. The patient expressed understanding and agreed to proceed.   SUBJECTIVE Chief Complaint  Patient presents with  . Cyst    Pt had cyst on L neck 8-10 days ago and has headache. Feels like it goes up neck and like it pulls neck to one side.   . Leg Pain    Bilateral leg weakness and swelling. Has been elevating legs to reduce swelling. Feels tight all the time.     HPI: Jackie [name] is a 48 y.o. female present virtually to discuss swelling in her legs and a mass she fell on her neck.  She states approximately 10 days ago she noticed a bump on the left posterior lateral side of her neck.  She states it was "pulsatile "at the time.  She reports having radiation of pain up the back of her head.  She states she has had a headache for a few days surrounding this event.  The day after it started she took an over-the-counter migraine headache medication and her headache and the pulsatile mass was gone the next day.  She reports she still has the heaviness that occurs in all 4 extremities.  She felt it was worse during the event described above.  She also endorses having "severe swelling "of her bilateral lower ankles.  She reports she elevated her feet and the ankle swellings have also resolved.  She still endorses having pins-and-needles paresthesias on her face. She has tried to see if her symptoms are worse when she is at home and more anxious.  She states she does feel there are some occasions the symptoms may be associated  with her increased anxiety surrounding her home surroundings.  However she does have the symptoms when she does not feel she has increased anxiety.  She had been referred to neurology in the past for paresthesias.  But never ended up having the studies they recommended can [place] to cost at that time. She denies any fever, chills, recurrent headaches, shortness of breath or visual changes during her event or currently.    ROS: See pertinent positives and negatives per HPI.  Patient Active Problem List   Diagnosis Date Noted  . Dense breast tissue on mammogram 02/25/2019  . Paresthesia and pain of both upper extremities 05/05/2017  . Pain in left arm 02/07/2017  . Family distress 10/26/2016  . Lymphadenopathy 08/02/2015  . 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 Allergic rhinitis 05/10/2013  . Endometriosis 12/22/2012  . Eczema 12/22/2011  . Anxiety disorder 12/22/2011  . Ovarian cyst 10/20/2011  . Chest pain, atypical 08/24/2011  . Palpitations 08/05/2011  . Neck pain 08/03/2011  . Vitamin D deficiency 07/22/2011  . Thyroid dysfunction 07/22/2011    Social History   Tobacco Use  . Smoking status: Never Smoker  . Smokeless tobacco: Never Used  Substance Use Topics  . Alcohol use: Yes    Comment: 1 to 2 glasses 5 times a week. but then the next week maybe nothing    Current Outpatient Medications:  .  B Complex-C (B-COMPLEX  WITH VITAMIN C) tablet, Take 1 tablet by mouth daily., Disp: , Rfl:  .  lactobacillus acidophilus (BACID) TABS tablet, Take 1 tablet by mouth daily. Reported on 03/30/2016, Disp: , Rfl:  .  loratadine (CLARITIN) 10 MG tablet, Take 10 mg by mouth daily as needed for allergies., Disp: , Rfl:  .  Multiple Vitamin (MULTIVITAMIN) tablet, Take 1 tablet by mouth daily.  , Disp: , Rfl:  .  triamcinolone (KENALOG) 0.025 % ointment, Apply 1 application topically 2 (two) times daily as needed., Disp: 30 g, Rfl: 5 .  Vitamin D, Cholecalciferol, 25 MCG (1000  UT) CAPS, Take 2 capsules by mouth daily., Disp: , Rfl:  .  azithromycin (ZITHROMAX) 250 MG tablet, 500 mg day 1, then 250 mg QD (Patient not taking: Reported on 05/03/2019), Disp: 6 tablet, Rfl: 0 .  escitalopram (LEXAPRO) 10 MG tablet, Take 0.5 tablets (5 mg total) by mouth daily for 7 days, THEN 1 tablet (10 mg total) daily., Disp: 87 tablet, Rfl: 0 .  hydrOXYzine (ATARAX/VISTARIL) 10 MG tablet, TAKE 1 TABLET BY MOUTH THREE TIMES A DAY AS NEEDED (Patient not taking: Reported on 05/03/2019), Disp: 90 tablet, Rfl: 0 .  Omega 3 1000 MG CAPS, Take 1 capsule by mouth daily., Disp: , Rfl:  .  omeprazole (PRILOSEC) 40 MG capsule, Take 1 capsule (40 mg total) by mouth daily. (Patient not taking: Reported on 05/03/2019), Disp: 30 capsule, Rfl: 3  Allergies  Allergen Reactions  . Clindamycin/Lincomycin Other (See Comments)    GI upset  . Nizoral [Ketoconazole]     Burning and dry skin    OBJECTIVE: Ht 5\' 2"  (1.575 m)   LMP 12/03/2013   BMI 27.44 kg/m  Gen: No acute distress. Nontoxic in appearance.  HENT: AT. [place].  MMM.  Eyes:Pupils Equal Round Reactive to light, Extraocular movements intact,  Conjunctiva without redness, discharge or icterus. CV: No edema Chest: Cough or shortness of breath not present Skin: No rashes, purpura or petechiae.  Neuro:  Normal gait. Alert. Oriented x3  Psych: Normal affect, dress and demeanor. Normal speech. Normal thought content and judgment.  ASSESSMENT AND PLAN: Jackie [name] is a 48 y.o. female present for  Neck pain/Paresthesia and pain of both upper extremities/Nonintractable headache, unspecified chronicity pattern, unspecified headache type Patient presents again today to discuss her paresthesias of her face and the paresthesias and "heaviness "in her bilateral upper and lower extremities.  In addition she also has an event of what sounds like a possible migraine headache, that was relieved with any over-the-counter migraine medication. Discussed the  "pulsatile "mass that she felt in her neck.  This was palpated superficially and is not present currently.  Uncertain exact etiology, could have been elevated blood pressure versus anxiety.  Doubt carotid origin but cannot rule out. -She is under a great deal of stress and anxiety, however has adamantly declined any type of medications for anxiety.  She had been attending therapy sessions. -She is agreeable to neurology referral again to complete the evaluation and discuss her current symptoms as possible atypical migraine symptoms. -Advised her to use the Vistaril in the meantime. -Advised her if her headaches or "pulsatile "mass return she needs to be seen immediately in the office so that a in person exam can be completed.  > 25 minutes spent with patient, >50% of time spent face to face     Felix PaciniRenee Kuneff, DO 05/03/2019

## 2019-06-13 ENCOUNTER — Encounter: Payer: Self-pay | Admitting: Neurology

## 2019-06-13 ENCOUNTER — Ambulatory Visit: Payer: Managed Care, Other (non HMO) | Admitting: Neurology

## 2019-06-13 ENCOUNTER — Other Ambulatory Visit: Payer: Self-pay

## 2019-06-13 ENCOUNTER — Telehealth: Payer: Self-pay | Admitting: Neurology

## 2019-06-13 VITALS — BP 133/90 | HR 73 | Temp 97.3°F | Ht 62.0 in | Wt 154.0 lb

## 2019-06-13 DIAGNOSIS — R202 Paresthesia of skin: Secondary | ICD-10-CM | POA: Diagnosis not present

## 2019-06-13 DIAGNOSIS — Z8249 Family history of ischemic heart disease and other diseases of the circulatory system: Secondary | ICD-10-CM

## 2019-06-13 NOTE — Telephone Encounter (Signed)
[employer] pending faxed notes  

## 2019-06-13 NOTE — Progress Notes (Signed)
Reason for visit: Facial paresthesias  Referring physician: Dr. Lavinia Sharps Carrillo is a 48 y.o. female  History of present illness:  Ms. Piedra is a 48 year old right-handed [place] female with a history of some neck discomfort in the past associated with tingling down both arms, she was seen in 2018 for this.  The patient was set up for MRI of the cervical spine and for EMG and nerve conduction studies but this was never done.  The patient has undergone some physical therapy for this, she claims that she has had full resolution of her neck and shoulder symptoms and arm problems.  In 2018, she was having intermittent episodes of left facial sensations of water running down the face and head.  This apparently has recurred since May 2020.  The patient has been under a lot of stress, she has separated from her husband.  She initially was having daily events mainly on the left side of the face in May of 2020, but the episodes may occur on the right side as well now.  They have become less frequent and may only occur 2-3 times a week, lasting only a few moments, up to 1 minute.  She has no other associated symptoms of weakness or vision changes or headache.  She reports no numbness or weakness of the extremities or any change in control of the bowels or the bladder or difficulty with balance.  She does have a history of underlying anxiety.  She claims to have a family history of cerebral aneurysms, her maternal grandmother and her mother had aneurysms, her mother had sustained a stroke at age 94.  She returns to this office for an evaluation.  Past Medical History:  Diagnosis Date  . Allergy    seasonal   . Anemia    during pregnancy  . Anemia, unspecified 04/27/2014  . Chicken pox 48 yrs old  . Dysmenorrhea 10/20/2011  . Endometriosis 12/22/2012  . Fibrocystic breast 07/22/2011  . Hair loss 07/03/2014  . Headache(784.0)    otc med prn  . 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  Hyponatremia 12/22/2012  . Low back pain 10/26/2014  . Lymphadenopathy 08/02/2015  . Miliaria   . Missed abortion    resolved on it's own - no surgery required.  . Pleurisy 08/29/2011   History - resolved  . Preventative health care 03/07/2013  . SVD (spontaneous vaginal delivery)    x 1  . Thyroid dysfunction 07/22/2011   history - resolved, no current problem  . Tinea versicolor 03/07/2013  . Vaginitis 07/15/2012  . Vitamin D deficiency 2012    Past Surgical History:  Procedure Laterality Date  . DENTAL SURGERY  11/2013  . LAPAROSCOPIC SUPRACERVICAL HYSTERECTOMY N/A 12/17/2013   Procedure: LAPAROSCOPIC  SUPRACERVICAL HYSTERECTOMY WITH BILATERAL SALPINGECTOMY;  Surgeon: Oliver Pila, MD;  Location: [facility];  Service: Gynecology;  Laterality: N/A;  2hrs OR time    Family History  Problem Relation Age of Onset  . Diabetes Mother        type 2  . Stroke Mother   . Osteoporosis Mother   . Heart disease Mother        angina  . Heart attack Father        MI in his 40s  . Diabetes Father        type 2  . Obesity Sister   . Diabetes Sister   . Obesity Brother   . Diabetes Brother  type 2  . Aneurysm Maternal Grandmother   . Heart attack Maternal Grandfather   . Pneumonia Paternal Grandfather   . Hypertension Sister        induced  . Other Sister        anemic  . Anemia Sister   . Depression Sister     Social history:  reports that she has never smoked. She has never used smokeless tobacco. She reports current alcohol use. She reports that she does not use drugs.  Medications:  Prior to Admission medications   Medication Sig Start Date End Date Taking? Authorizing Provider  B Complex-C (B-COMPLEX WITH VITAMIN C) tablet Take 1 tablet by mouth daily.   Yes [provider]  loratadine (CLARITIN) 10 MG tablet Take 10 mg by mouth daily as needed for allergies.   Yes [provider]  Multiple Vitamin (MULTIVITAMIN) tablet Take 1 tablet by mouth daily.      Yes [provider]  Omega 3 1000 MG CAPS Take 1 capsule by mouth daily.   Yes [provider]  triamcinolone (KENALOG) 0.025 % ointment Apply 1 application topically 2 (two) times daily as needed. 02/25/19  Yes Kuneff, Renee A, DO  Vitamin D, Cholecalciferol, 25 MCG (1000 UT) CAPS Take 2 capsules by mouth daily.   Yes [provider]      Allergies  Allergen Reactions  . Clindamycin/Lincomycin Other (See Comments)    GI upset  . Nizoral [Ketoconazole]     Burning and dry skin    ROS:  Out of a complete 14 system review of symptoms, the patient complains only of the following symptoms, and all other reviewed systems are negative.  Facial sensations  Blood pressure 133/90, pulse 73, temperature (!) 97.3 F (36.3 C), temperature source Temporal, height 5\' 2"  (1.575 m), weight 154 lb (69.9 kg), last menstrual period 12/03/2013.  Physical Exam  General: The patient is alert and cooperative at the time of the examination.  Eyes: Pupils are equal, round, and reactive to light. Discs are flat bilaterally.  Neck: The neck is supple, no carotid bruits are noted.  Respiratory: The respiratory examination is clear.  Cardiovascular: The cardiovascular examination reveals a regular rate and rhythm, no obvious murmurs or rubs are noted.  Skin: Extremities are without significant edema.  Neurologic Exam  Mental status: The patient is alert and oriented x 3 at the time of the examination. The patient has apparent normal recent and remote memory, with an apparently normal attention span and concentration ability.  Cranial nerves: Facial symmetry is present. There is good sensation of the face to pinprick and soft touch bilaterally. The strength of the facial muscles and the muscles to head turning and shoulder shrug are normal bilaterally. Speech is well enunciated, no aphasia or dysarthria is noted. Extraocular movements are full. Visual fields are full. The  tongue is midline, and the patient has symmetric elevation of the soft palate. No obvious hearing deficits are noted.  Motor: The motor testing reveals 5 over 5 strength of all 4 extremities. Good symmetric motor tone is noted throughout.  Sensory: Sensory testing is intact to pinprick, soft touch, vibration sensation, and position sense on all 4 extremities. No evidence of extinction is noted.  Coordination: Cerebellar testing reveals good finger-nose-finger and heel-to-shin bilaterally.  Gait and station: Gait is normal. Tandem gait is normal. Romberg is negative. No drift is seen.  Reflexes: Deep tendon reflexes are symmetric and normal bilaterally. Toes are downgoing bilaterally.  Assessment/Plan:  1.  Intermittent facial sensation  2.  Family history of cerebral aneurysm  The patient is describing intermittent facial sensations that are pure sensory in nature, this likely is a benign issue and may be tied into underlying stress and anxiety.  However, the patient does have a strong family history for cerebral aneurysms and further evaluation in this regard is indicated.  The patient will have MRI of the brain and MRA of the head.  The patient will follow-up through this office if needed.  I will contact her regarding the results of the above testing.  Jill Alexanders MD 06/13/2019 9:52 AM  [facility] [address] [place], [place] [address]  Phone [phone] Fax [phone]

## 2019-06-17 NOTE — Telephone Encounter (Signed)
MR Brain wo contrast & MRA Head wo contrast Dr. Skip Estimable Auth: [id number] (exp. 06/13/19 to 12/10/19). Patient is scheduled at [facility] for 06/18/19. No to the covid questions.

## 2019-06-18 ENCOUNTER — Ambulatory Visit: Payer: Managed Care, Other (non HMO)

## 2019-06-18 ENCOUNTER — Other Ambulatory Visit: Payer: Self-pay

## 2019-06-18 DIAGNOSIS — Z8249 Family history of ischemic heart disease and other diseases of the circulatory system: Secondary | ICD-10-CM | POA: Diagnosis not present

## 2019-06-18 DIAGNOSIS — R202 Paresthesia of skin: Secondary | ICD-10-CM

## 2019-06-20 ENCOUNTER — Telehealth: Payer: Self-pay | Admitting: Neurology

## 2019-06-20 NOTE — Telephone Encounter (Signed)
  I called the patient.  MRI of the brain and MRA of the head is normal.  No evidence of demyelinating disease, the patient has a history of aneurysms in the family, no evidence of cerebral aneurysm on MRA of the head.   MRI brain 06/20/19:  IMPRESSION:   Unremarkable MRI brain (without). No acute findings.    MRA head 06/20/19:  IMPRESSION:   Normal MRA head (without).

## 2019-09-18 ENCOUNTER — Telehealth: Payer: Self-pay

## 2019-09-18 NOTE — Telephone Encounter (Signed)
Patient is requesting appointment for fatigue. She feels it is linked to recent divorce. Mailed patient new DPR. Patient does not want any information released to Jorene Guest.  Patient is requesting appointment.

## 2019-09-18 NOTE — Telephone Encounter (Signed)
Called patient, scheduled VOV with Dr. Raoul Pitch next week. She has moved to [facility]. Updated address.

## 2019-09-18 NOTE — Telephone Encounter (Signed)
Please call and schedule her for next available appt. This will not be acute, next week will be fine if nothing is available this week.

## 2019-09-26 ENCOUNTER — Other Ambulatory Visit: Payer: Self-pay

## 2019-09-26 ENCOUNTER — Ambulatory Visit: Payer: 59 | Admitting: Family Medicine

## 2019-09-30 ENCOUNTER — Ambulatory Visit (INDEPENDENT_AMBULATORY_CARE_PROVIDER_SITE_OTHER): Payer: BC Managed Care – PPO | Admitting: Family Medicine

## 2019-09-30 ENCOUNTER — Encounter: Payer: Self-pay | Admitting: Family Medicine

## 2019-09-30 ENCOUNTER — Other Ambulatory Visit: Payer: Self-pay

## 2019-09-30 ENCOUNTER — Ambulatory Visit: Payer: 59 | Admitting: Family Medicine

## 2019-09-30 VITALS — Temp 97.4°F

## 2019-09-30 DIAGNOSIS — R5383 Other fatigue: Secondary | ICD-10-CM | POA: Diagnosis not present

## 2019-09-30 DIAGNOSIS — F419 Anxiety disorder, unspecified: Secondary | ICD-10-CM

## 2019-09-30 DIAGNOSIS — G479 Sleep disorder, unspecified: Secondary | ICD-10-CM

## 2019-09-30 MED ORDER — AMITRIPTYLINE HCL 10 MG PO TABS: ORAL_TABLET | ORAL | 0 refills | Status: DC

## 2019-09-30 NOTE — Progress Notes (Signed)
VIRTUAL VISIT VIA VIDEO  I connected with Jackie [name] on 09/30/19 at  3:30 PM EST by a video enabled telemedicine application and verified that I am speaking with the correct person using two identifiers. Location patient: Home Location provider: [facility], Office Persons participating in the virtual visit: Patient, Dr. Raoul Pitch and R.Baker, LPN  I discussed the limitations of evaluation and management by telemedicine and the availability of in person appointments. The patient expressed understanding and agreed to proceed.   SUBJECTIVE Chief Complaint  Patient presents with  . Fatigue    HPI: Jackie [name] is a 49 y.o. female present for worsening fatigue.  Patient reports she has finally moved out of [place] and is divorcing her husband.  She has been rather busy in the process with staying with friends while going through separation.  Her husband had also moved to [place].  She was helping with the selling of the house in October and then was reportedly homeless for a while staying with friends.  She has now settled in a new location.  She states over the last 7 to 8 days she has felt exhausted.  She endorses that this is the first time she has really been able to slow down since the start of separation.  She is not sleeping well.  She wakes up in the middle the night and cannot go back to sleep.  She knows she is under a lot of stress and anxiety causing her symptoms.  She is having some manifestations of the tingling she had in the past.  She was evaluated by neurology for this and they felt it was anxiety related. ROS: See pertinent positives and negatives per HPI.  Patient Active Problem List   Diagnosis Date Noted  . Dense breast tissue on mammogram 02/25/2019  . Paresthesia and pain of both upper extremities 05/05/2017  . Pain in left arm 02/07/2017  . Family distress 10/26/2016  . Lymphadenopathy 08/02/2015  . 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  Allergic rhinitis 05/10/2013  . Endometriosis 12/22/2012  . Eczema 12/22/2011  . Anxiety disorder 12/22/2011  . Ovarian cyst 10/20/2011  . Chest pain, atypical 08/24/2011  . Palpitations 08/05/2011  . Neck pain 08/03/2011  . Vitamin D deficiency 07/22/2011  . Thyroid dysfunction 07/22/2011    Social History   Tobacco Use  . Smoking status: Never Smoker  . Smokeless tobacco: Never Used  Substance Use Topics  . Alcohol use: Yes    Comment: 1 to 2 glasses 5 times a week. but then the next week maybe nothing    Current Outpatient Medications:  .  B Complex-C (B-COMPLEX WITH VITAMIN C) tablet, Take 1 tablet by mouth daily., Disp: , Rfl:  .  loratadine (CLARITIN) 10 MG tablet, Take 10 mg by mouth daily as needed for allergies., Disp: , Rfl:  .  Multiple Vitamin (MULTIVITAMIN) tablet, Take 1 tablet by mouth daily.  , Disp: , Rfl:  .  Omega 3 1000 MG CAPS, Take 1 capsule by mouth daily., Disp: , Rfl:  .  triamcinolone (KENALOG) 0.025 % ointment, Apply 1 application topically 2 (two) times daily as needed., Disp: 30 g, Rfl: 5 .  Vitamin D, Cholecalciferol, 25 MCG (1000 UT) CAPS, Take 2 capsules by mouth daily., Disp: , Rfl:   Allergies  Allergen Reactions  . Clindamycin/Lincomycin Other (See Comments)    GI upset  . Nizoral [Ketoconazole]     Burning and dry skin    OBJECTIVE:  Temp (!) 97.4 F (36.3 C) (Oral)   LMP 12/03/2013  Gen: No acute distress. Nontoxic in appearance.  HENT: AT. [place].  MMM.  Eyes:Pupils Equal Round Reactive to light, Extraocular movements intact,  Conjunctiva without redness, discharge or icterus. Neuro:  Alert. Oriented x3  Psych: Normal affect, dress and demeanor. Normal speech. Normal thought content and judgment.  ASSESSMENT AND PLAN: Jackie [name] is a 49 y.o. female present for  Anxiety disorder, unspecified type/Sleep disorder/ fatigue Lengthy conversation today and discussion on possible treatment options.  Congratulated her on moving on with her  life.  This has been a long time coming and a difficult decision for her. Encouraged her to call Dr. Dewayne Hatch and schedule a follow-up. Start amitriptyline taper.  Amitriptyline 10 mg nightly, taper every 3 nights by 10 mg if needed-max dose 50 mg nightly.  Patient reports understanding on tapering instructions. Follow-up 3.5 weeks on new medication.   Meds ordered this encounter  Medications  . amitriptyline (ELAVIL) 10 MG tablet    Sig: 10 mg (1 tab) qhs for 3 days, then increase to 2 tabs. May increase every 3 days as needed until max. 50 mg QHS.    Dispense:  120 tablet    Refill:  0     Wealthy Danielski Claiborne Billings, DO 09/30/2019

## 2019-10-01 ENCOUNTER — Telehealth: Payer: Self-pay | Admitting: Family Medicine

## 2019-10-01 NOTE — Telephone Encounter (Signed)
Received voicemail from patient   Patient had virtual appt with Dr. Claiborne Billings yesterday 09/30/19  [facility] location [facility] [address] [place], [place]

## 2019-10-04 ENCOUNTER — Other Ambulatory Visit: Payer: Self-pay

## 2019-10-04 NOTE — Telephone Encounter (Signed)
Patient is requesting triamcinolone (KENALOG) 0.025 % ointment. [facility] send to [facility] [address] [facility]

## 2019-10-07 MED ORDER — TRIAMCINOLONE ACETONIDE 0.025 % EX OINT: 1.0000 "application " | TOPICAL_OINTMENT | Freq: Two times a day (BID) | CUTANEOUS | 5 refills | Status: AC | PRN

## 2019-10-07 NOTE — Telephone Encounter (Signed)
Cream refilled. Please make pt aware.

## 2019-10-07 NOTE — Telephone Encounter (Signed)
Pt is asking for Kenalog Cream [facility] is [facility], [place]. Please advise if okay and how many refills. I will call and cancel RX at [facility] [address]

## 2019-12-12 ENCOUNTER — Other Ambulatory Visit: Payer: Self-pay

## 2019-12-12 ENCOUNTER — Ambulatory Visit (INDEPENDENT_AMBULATORY_CARE_PROVIDER_SITE_OTHER): Payer: BC Managed Care – PPO | Admitting: Family Medicine

## 2019-12-12 ENCOUNTER — Encounter: Payer: Self-pay | Admitting: Family Medicine

## 2019-12-12 VITALS — Temp 97.8°F | Ht 62.0 in

## 2019-12-12 DIAGNOSIS — F419 Anxiety disorder, unspecified: Secondary | ICD-10-CM

## 2019-12-12 DIAGNOSIS — G479 Sleep disorder, unspecified: Secondary | ICD-10-CM

## 2019-12-12 NOTE — Progress Notes (Signed)
VIRTUAL VISIT VIA VIDEO  I connected with Jackie [name] on 12/13/19 at 11:00 AM EDT by a video enabled telemedicine application and verified that I am speaking with the correct person using two identifiers. Location patient: Home Location provider: [facility], Office Persons participating in the virtual visit: Patient, Dr. Raoul Pitch and R.Baker, LPN  I discussed the limitations of evaluation and management by telemedicine and the availability of in person appointments. The patient expressed understanding and agreed to proceed.   SUBJECTIVE Chief Complaint  Patient presents with   Anxiety    Pt is doing a 4 week F/U visit. Pt has stopped the Insomnia medication.    Insomnia    HPI: Jackie [name] is a 49 y.o. female present for her anxiety and insomnia. She was started on low dose amitriptyline after last visit>> she stopped taking it.  Patient has been through many stressful situations and changes over the last few years concerning her marital relationship.  She is still proceeding with the divorce but finds herself again not being able to sit down and complete the paperwork and focus.  She is not sleeping well.  She reports moments of feeling fearful and not leaving her home for 2-4 days if she sees a man or a car etc. that reminds her of her husband.  She again states today she stopped the medication after tapering up to amitriptyline 20 mg nightly.  She states she does not know if it actually helped her sleep or not.  She states she has felt the same on the medication.  She still feels exhausted.  She does not want to take prescribed medications.  She is no longer in counseling. Prior  Note: Patient reports she has finally moved out of [place] and is divorcing her husband.  She has been rather busy in the process with staying with friends while going through separation.  Her husband had also moved to [place].  She was helping with the selling of the house in October and then was  reportedly homeless for a while staying with friends.  She has now settled in a new location.  She states over the last 7 to 8 days she has felt exhausted.  She endorses that this is the first time she has really been able to slow down since the start of separation.  She is not sleeping well.  She wakes up in the middle the night and cannot go back to sleep.  She knows she is under a lot of stress and anxiety causing her symptoms.  She is having some manifestations of the tingling she had in the past.  She was evaluated by neurology for this and they felt it was anxiety related. ROS: See pertinent positives and negatives per HPI.  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 but then the next week maybe nothing    Current Outpatient Medications:    Cholecalciferol (VITAMIN D) 10 MCG/ML LIQD, Take by mouth. Using drops daily, Disp: , Rfl:    loratadine (CLARITIN) 10 MG tablet, Take 10 mg by mouth daily as needed for allergies., Disp: , Rfl:    Multiple Vitamin (MULTIVITAMIN) tablet, Take 1 tablet by mouth daily.  , Disp: , Rfl:    Omega 3 1000 MG CAPS, Take 1 capsule by mouth daily., Disp: , Rfl:    OVER THE COUNTER  MEDICATION, Adrenal health nightly restore, Disp: , Rfl:    triamcinolone (KENALOG) 0.025 % ointment, Apply 1 application topically 2 (two) times daily as needed., Disp: 30 g, Rfl: 5  Allergies  Allergen Reactions   Clindamycin/Lincomycin Other (See Comments)    GI upset   Nizoral [Ketoconazole]     Burning and dry skin    OBJECTIVE: Temp 97.8 F (36.6 C) (Temporal)    Ht 5\' 2"  (1.575 m)    LMP 12/03/2013    BMI 28.17 kg/m  Gen: Afebrile. No acute distress.  HENT: AT. [place].  Psych: Talkative.  Normal dress and demeanor. Normal speech. Normal thought content and judgment.  No SI or HI   ASSESSMENT AND PLAN: Jackie [name] is a 49 y.o. female present for  Anxiety disorder, unspecified type/Sleep disorder Patient has uncontrolled anxiety with somatic complaints.  We discussed this in great detail today.  She is not comfortable taking prescribed medications for help. Congratulated her on moving on with her life.  This has been a long time coming and a difficult decision for her. Encouraged her to call Dr. 54 and schedule a follow-up.  I have also placed the referral again  to expedite this process. Encouraged her to start exercising/walking for 30 minutes a day outside the home.  Encourage her to use melatonin or sleepy time tea's etc. to help with her sleep disturbance. Follow-up as needed   No orders of the defined types were placed in this encounter. No orders of the defined types were placed in this encounter.   Referral Orders     Ambulatory referral to Psychology  Greater than 30 minutes was spent counseling patient.  Dewayne Hatch, DO 12/13/2019

## 2019-12-13 ENCOUNTER — Encounter: Payer: Self-pay | Admitting: Family Medicine

## 2019-12-13 NOTE — Patient Instructions (Signed)

## 2020-01-28 ENCOUNTER — Ambulatory Visit: Payer: BC Managed Care – PPO | Admitting: Psychology

## 2020-02-12 ENCOUNTER — Ambulatory Visit: Payer: BC Managed Care – PPO | Admitting: Clinical

## 2020-02-19 ENCOUNTER — Ambulatory Visit: Payer: BC Managed Care – PPO | Admitting: Clinical

## 2020-03-02 ENCOUNTER — Ambulatory Visit: Payer: BC Managed Care – PPO | Admitting: Clinical

## 2020-03-03 ENCOUNTER — Ambulatory Visit (INDEPENDENT_AMBULATORY_CARE_PROVIDER_SITE_OTHER): Payer: BC Managed Care – PPO | Admitting: Clinical

## 2020-03-03 DIAGNOSIS — F4323 Adjustment disorder with mixed anxiety and depressed mood: Secondary | ICD-10-CM | POA: Diagnosis not present

## 2020-03-17 ENCOUNTER — Ambulatory Visit (INDEPENDENT_AMBULATORY_CARE_PROVIDER_SITE_OTHER): Payer: BC Managed Care – PPO | Admitting: Clinical

## 2020-03-17 DIAGNOSIS — F4323 Adjustment disorder with mixed anxiety and depressed mood: Secondary | ICD-10-CM

## 2020-03-23 ENCOUNTER — Ambulatory Visit (INDEPENDENT_AMBULATORY_CARE_PROVIDER_SITE_OTHER): Payer: BC Managed Care – PPO | Admitting: Clinical

## 2020-03-23 DIAGNOSIS — F4323 Adjustment disorder with mixed anxiety and depressed mood: Secondary | ICD-10-CM | POA: Diagnosis not present

## 2020-04-14 ENCOUNTER — Other Ambulatory Visit: Payer: Self-pay | Admitting: Family Medicine

## 2020-04-14 DIAGNOSIS — Z1231 Encounter for screening mammogram for malignant neoplasm of breast: Secondary | ICD-10-CM

## 2020-04-20 ENCOUNTER — Ambulatory Visit: Payer: BC Managed Care – PPO | Admitting: Family Medicine

## 2020-04-21 ENCOUNTER — Ambulatory Visit
Admission: RE | Admit: 2020-04-21 | Discharge: 2020-04-21 | Disposition: A | Discharge status: Home / Self Care | Payer: BC Managed Care – PPO | Source: Ambulatory Visit | Attending: Family Medicine | Admitting: Family Medicine

## 2020-04-21 ENCOUNTER — Ambulatory Visit: Payer: BC Managed Care – PPO | Admitting: Family Medicine

## 2020-04-21 ENCOUNTER — Other Ambulatory Visit: Payer: Self-pay

## 2020-04-21 DIAGNOSIS — Z1231 Encounter for screening mammogram for malignant neoplasm of breast: Secondary | ICD-10-CM

## 2020-04-22 ENCOUNTER — Ambulatory Visit (INDEPENDENT_AMBULATORY_CARE_PROVIDER_SITE_OTHER): Payer: BC Managed Care – PPO | Admitting: Family Medicine

## 2020-04-22 ENCOUNTER — Encounter: Payer: Self-pay | Admitting: Family Medicine

## 2020-04-22 VITALS — BP 120/82 | HR 97 | Temp 97.9°F | Resp 18 | Ht 62.0 in | Wt 162.6 lb

## 2020-04-22 DIAGNOSIS — R319 Hematuria, unspecified: Secondary | ICD-10-CM | POA: Diagnosis not present

## 2020-04-22 DIAGNOSIS — K6289 Other specified diseases of anus and rectum: Secondary | ICD-10-CM

## 2020-04-22 DIAGNOSIS — K649 Unspecified hemorrhoids: Secondary | ICD-10-CM

## 2020-04-22 DIAGNOSIS — R1032 Left lower quadrant pain: Secondary | ICD-10-CM | POA: Diagnosis not present

## 2020-04-22 DIAGNOSIS — R102 Pelvic and perineal pain: Secondary | ICD-10-CM

## 2020-04-22 DIAGNOSIS — Z1211 Encounter for screening for malignant neoplasm of colon: Secondary | ICD-10-CM

## 2020-04-22 LAB — POC URINALSYSI DIPSTICK (AUTOMATED)
Bilirubin, UA: NEGATIVE
Glucose, UA: NEGATIVE
Ketones, UA: NEGATIVE
Leukocytes, UA: NEGATIVE
Nitrite, UA: NEGATIVE
Protein, UA: POSITIVE — AB
Spec Grav, UA: 1.025 (ref 1.010–1.025)
Urobilinogen, UA: 0.2 E.U./dL
pH, UA: 5.5 (ref 5.0–8.0)

## 2020-04-22 NOTE — Patient Instructions (Addendum)
Proctalgia Fugax  Proctalgia fugax is a condition that involves short episodes of intense pain in the rectum. The rectum is the last part of the large intestine. The pain can last from seconds to minutes. Episodes often occur during the night and wake the person from sleep. This condition is not a sign of cancer, but your health care provider may want to rule out other conditions. What are the causes? The exact cause of this condition is not known. One possible cause may be spasm of the pelvic muscles or spasms of the lowest part of the large intestine. What are the signs or symptoms? The only symptom of this condition is rectal pain. The pain may:  Be intense or severe.  Last for only a few seconds, or it may last up to 30 minutes.  Occur at night and wake you up from sleep. How is this diagnosed? This condition may be diagnosed based on your medical history, a physical exam, and by ruling out other problems that could cause the pain. You may have various tests, such as:  Anoscopy. In this test, a lighted scope is put into the rectum to look for abnormalities.  Barium enema. In this test, X-rays are taken after a white, chalky substance called barium is put into the colon. The barium shows up well on the X-rays and makes it easier to see any problems.  Blood tests to rule out infections or other problems. How is this treated? There is no specific treatment for this condition. This condition may be managed with:  Medicines.  Warm baths.  Relaxation techniques, such as deep breathing exercises or gentle yoga.  Gentle massage of the painful area.  Biofeedback. Follow these instructions at home:   Take over-the-counter and prescription medicines only as told by your health care provider.  Follow instructions from your health care provider about eating or drinking restrictions.  Ask your health care provider what activities are safe for you.  Try warm baths, massaging the area,  or progressive relaxation techniques as told by your health care provider.  Keep all follow-up visits as told by your health care provider. This is important. Contact a health care provider if:  Your pain gets worse.  You develop new symptoms.  You have anal pain along with a fever. Get help right away if you:  Have blood in your stool or blood coming from the rectal area. Summary  Proctalgia fugax is a condition that involves short episodes of intense pain in the rectum. Episodes often occur during the night and wake the person from sleep.  The cause of this condition is not known.  Medicines, warm baths, massage, relaxation techniques, and biofeedback may help to manage the pain.  Get help right away if you have blood in your stool or blood coming from the rectal area. This information is not intended to replace advice given to you by your health care provider. Make sure you discuss any questions you have with your health care provider. Document Revised: 04/18/2018 Document Reviewed: 04/18/2018 Elsevier Patient Education  2020 [employer]   Interstitial Cystitis  Interstitial cystitis is inflammation of the bladder. This may cause pain in the bladder area as well as a frequent and urgent need to urinate. The bladder is a hollow organ in the lower part of the abdomen. It stores urine after the urine is made in the kidneys. The severity of interstitial cystitis can vary from person to person. You may have flare-ups, and then your  symptoms may go away for a while. For many people, it becomes a long-term (chronic) problem. What are the causes? The cause of this condition is not known. What increases the risk? The following factors may make you more likely to develop this condition:  You are female.  You have fibromyalgia.  You have irritable bowel syndrome (IBS).  You have endometriosis. This condition may be aggravated by:  Stress.  Smoking.  Spicy foods. What are  the signs or symptoms? Symptoms of interstitial cystitis vary, and they can change over time. Symptoms may include:  Discomfort or pain in the bladder area, which is in the lower abdomen. Pain can range from mild to severe. The pain may change in intensity as the bladder fills with urine or as it empties.  Pain in the pelvic area, between the hip bones.  An urgent need to urinate.  Frequent urination.  Pain during urination.  Pain during sex.  Blood in the urine. For women, symptoms often get worse during menstruation. How is this diagnosed? This condition is diagnosed based on your symptoms, your medical history, and a physical exam. You may have tests to rule out other conditions, such as:  Urine tests.  Cystoscopy. For this test, a tool similar to a very thin telescope is used to look into your bladder.  Biopsy. This involves taking a sample of tissue from the bladder to be examined under a microscope. How is this treated? There is no cure for this condition, but treatment can help you control your symptoms. Work closely with your health care provider to find the most effective treatments for you. Treatment options may include:  Medicines to relieve pain and reduce how often you feel the need to urinate.  Learning ways to control when you urinate (bladder training).  Lifestyle changes, such as changing your diet or taking steps to control stress.  Using a device that provides electrical stimulation to your nerves, which can relieve pain (neuromodulation therapy). The device is placed on your back, where it blocks the nerves that cause you to feel pain in your bladder area.  A procedure that stretches your bladder by filling it with air or fluid.  Surgery. This is rare. It is only done for extreme cases, if other treatments do not help. Follow these instructions at home: Bladder training   Use bladder training techniques as directed. Techniques may include: ? Urinating  at scheduled times. ? Training yourself to delay urination. ? Doing exercises (Kegel exercises) to strengthen the muscles that control urine flow.  Keep a bladder diary. ? Write down the times that you urinate and any symptoms that you have. This can help you find out which foods, liquids, or activities make your symptoms worse. ? Use your bladder diary to schedule bathroom trips. If you are away from home, plan to be near a bathroom at each of your scheduled times.  Make sure that you urinate just before you leave the house and just before you go to bed. Eating and drinking  Make dietary changes as recommended by your health care provider. You may need to avoid: ? Spicy foods. ? Foods that contain a lot of potassium.  Limit your intake of beverages that make you need to urinate. These include: ? Caffeinated beverages like soda, coffee, and tea. ? Alcohol. General instructions  Take over-the-counter and prescription medicines only as told by your health care provider.  Do not drink alcohol.  You can try a warm or cool compress  over your bladder for comfort.  Avoid wearing tight clothing.  Do not use any products that contain nicotine or tobacco, such as cigarettes and e-cigarettes. If you need help quitting, ask your health care provider.  Keep all follow-up visits as told by your health care provider. This is important. Contact a health care provider if you have:  Symptoms that do not get better with treatment.  Pain or discomfort that gets worse.  More frequent urges to urinate.  A fever. Get help right away if:  You have no control over when you urinate. Summary  Interstitial cystitis is inflammation of the bladder.  This condition may cause pain in the bladder area as well as a frequent and urgent need to urinate.  You may have flare-ups of the condition, and then it may go away for a while. For many people, it becomes a long-term (chronic) problem.  There is no  cure for interstitial cystitis, but treatment methods are available to control your symptoms. This information is not intended to replace advice given to you by your health care provider. Make sure you discuss any questions you have with your health care provider. Document Revised: 08/18/2017 Document Reviewed: 07/31/2017 Elsevier Patient Education  2020 [employer].

## 2020-04-22 NOTE — Progress Notes (Signed)
This visit occurred during the SARS-CoV-2 public health emergency.  Safety protocols were in place, including screening questions prior to the visit, additional usage of staff PPE, and extensive cleaning of exam room while observing appropriate contact time as indicated for disinfecting solutions.    Jackie [name] , 1971-02-04, 49 y.o., female MRN: [id number] Patient Care Team    Relationship Specialty Notifications Start End  Jackie Leatherwood, DO PCP - General Family Medicine  05/16/16   Huel Cote, MD Consulting Physician Obstetrics and Gynecology  04/24/18     Chief Complaint  Patient presents with  . Back Pain    pt states on and off 3-4 weeks but states worsening in the last 2 weeks  . Groin Pain     Subjective: Pt presents for an OV with complaints of left lower quadrant pain of 3-4 weeks duration.  Associated symptoms include worsening over the last 2 weeks.  Patient reports pain starts in her left lower quadrant and will radiate straight back to her back.  She states she feels the pain near her rectum.  She states that feels like a strong "stitch "deep in her left lower pelvis.  Pain will last about a minute.  This can occur at different times of the day and is not associated with any particular activity or position.  Is occurred when she has been standing and it also has occurred when she is laying flat in the bed.  She does report frequent stools daily which is her routine.  She does not have constipation or straining.  She does have infrequent bright red blood when wiping after bowel movement. Patient's last menstrual period was 12/03/2013.  Patient has had a hysterectomy and salpingectomy, she reports she does still have both of her ovaries.   Depression screen PHQ 2/9 04/24/2018  Decreased Interest 0  Down, Depressed, Hopeless 0  PHQ - 2 Score 0  Altered sleeping 0  Tired, decreased energy 1  Change in appetite 0  Feeling bad or failure about yourself  0  Trouble  concentrating 0  Moving slowly or fidgety/restless 0  PHQ-9 Score 1  Difficult doing work/chores Not difficult at all    Allergies  Allergen Reactions  . Clindamycin/Lincomycin Other (See Comments)    GI upset  . Nizoral [Ketoconazole]     Burning and dry skin   Social History   Social History Narrative   LIves with husband.  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    Caffeine  2-3 cups.  Education:  Some college.  Working: Teacher, English as a foreign language, Sports administrator.    Past Medical History:  Diagnosis Date  . Allergy    seasonal   . Anemia    during pregnancy  . Anemia, unspecified 04/27/2014  . Chicken pox 49 yrs old  . Dysmenorrhea 10/20/2011  . Endometriosis 12/22/2012  . Fibrocystic breast 07/22/2011  . Hair loss 07/03/2014  . Headache(784.0)    otc med prn  . 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 Hyponatremia 12/22/2012  . Low back pain 10/26/2014  . Lymphadenopathy 08/02/2015  . Miliaria   . Missed abortion    resolved on it's own - no surgery required.  . Pleurisy 08/29/2011   History - resolved  . Preventative health care 03/07/2013  . SVD (spontaneous vaginal delivery)    x 1  . Thyroid dysfunction 07/22/2011   history - resolved, no current problem  . Tinea versicolor 03/07/2013  . Vaginitis 07/15/2012  . Vitamin  D deficiency 2012   Past Surgical History:  Procedure Laterality Date  . DENTAL SURGERY  11/2013  . LAPAROSCOPIC SUPRACERVICAL HYSTERECTOMY N/A 12/17/2013   Procedure: LAPAROSCOPIC  SUPRACERVICAL HYSTERECTOMY WITH BILATERAL SALPINGECTOMY;  Surgeon: Oliver Pila, MD;  Location: [facility];  Service: Gynecology;  Laterality: N/A;  2hrs OR time   Family History  Problem Relation Age of Onset  . Diabetes Mother        type 2  . Stroke Mother   . Osteoporosis Mother   . Heart disease Mother        angina  . Heart attack Father        MI in his 44s  . Diabetes Father        type 2  . Obesity Sister   . Diabetes Sister   . Obesity Brother   . Diabetes  Brother        type 2  . Aneurysm Maternal Grandmother   . Heart attack Maternal Grandfather   . Pneumonia Paternal Grandfather   . Hypertension Sister        induced  . Other Sister        anemic  . Anemia Sister   . Depression Sister    Allergies as of 04/22/2020      Reactions   Clindamycin/lincomycin Other (See Comments)   GI upset   Nizoral [ketoconazole]    Burning and dry skin      Medication List       Accurate as of April 22, 2020 11:59 PM. If you have any questions, ask your nurse or doctor.        loratadine 10 MG tablet Commonly known as: CLARITIN Take 10 mg by mouth daily as needed for allergies.   multivitamin tablet Take 1 tablet by mouth daily.   Omega 3 1000 MG Caps Take 1 capsule by mouth daily.   OVER THE COUNTER MEDICATION Adrenal health nightly restore   triamcinolone 0.025 % ointment Commonly known as: KENALOG Apply 1 application topically 2 (two) times daily as needed.   vitamin C 1000 MG tablet Take 1,000 mg by mouth daily.   Vitamin D 10 MCG/ML Liqd Take by mouth. Using drops daily       All past medical history, surgical history, allergies, family history, immunizations andmedications were updated in the EMR today and reviewed under the history and medication portions of their EMR.     ROS: Negative, with the exception of above mentioned in HPI   Objective:  BP 120/82 (BP Location: Left Arm, Patient Position: Sitting, Cuff Size: Normal)   Pulse 97   Temp 97.9 F (36.6 C) (Oral)   Resp 18   Ht 5\' 2"  (1.575 m)   Wt 162 lb 9.6 oz (73.8 kg)   LMP 12/03/2013   SpO2 97%   BMI 29.74 kg/m  Body mass index is 29.74 kg/m. Gen: Afebrile. No acute distress. Nontoxic in appearance, well developed, well nourished.  HENT: AT. [name].  No cough or shortness of breath Eyes:Pupils Equal Round Reactive to light, Extraocular movements intact,  Conjunctiva without redness, discharge or icterus. CV: RRR no murmur, no edema Chest: CTAB, no  wheeze or crackles. Good air movement, normal resp effort.  Abd: Soft.  Flat.  Mild tenderness to palpation left lower quadrant.  ND. BS present.  No masses palpated. No rebound or guarding.  Skin: no rashes, purpura or petechiae.  Neuro: Normal gait. PERLA. EOMi. Alert. Oriented x3 . Psych: Normal affect, dress  and demeanor. Normal speech. Normal thought content and judgment.  No exam data present No results found. No results found for this or any previous visit (from the past 24 hour(s)).  Assessment/Plan: Debbra Digiulio is a 49 y.o. female present for OV for  Groin pain, left/hematuria/rectal pain/hemorrhoids/pain Uncertain etiology of her groin pain.  Discussed proctalgia fugax versus interstitial cystitis/bladder spasms with her today.  Cannot rule out ovarian pathology versus inguinal hernia.  Recommended pelvic ultrasound and patient is agreeable to this approach today.  Also recommended gastroenterology referral both for rectal pain, hemorrhoids and she is due for her colon cancer screening. - POCT Urinalysis Dipstick (Automated) - US Abdomen Complete; Future  Colon cancer screening/hemorrhoids - Ambulatory referral to Gastroenterology    Reviewed expectations re: course of current medical issues.  Discussed self-management of symptoms.  Outlined signs and symptoms indicating need for more acute intervention.  Patient verbalized understanding and all questions were answered.  Patient received an After-Visit Summary.    Orders Placed This Encounter  Procedures  . Urine Culture  . US Abdomen Complete  . Urinalysis w microscopic + reflex cultur  . REFLEXIVE URINE CULTURE  . Ambulatory referral to Gastroenterology  . POCT Urinalysis Dipstick (Automated)   No orders of the defined types were placed in this encounter.   Referral Orders     Ambulatory referral to Gastroenterology   Note is dictated utilizing voice recognition software. Although note has been proof  read prior to signing, occasional typographical errors still can be missed. If any questions arise, please do not hesitate to call for verification.   electronically signed by:  Felix Pacini, DO  [facility] - OR

## 2020-04-23 ENCOUNTER — Telehealth: Payer: Self-pay

## 2020-04-23 ENCOUNTER — Other Ambulatory Visit: Payer: Self-pay

## 2020-04-23 NOTE — Telephone Encounter (Signed)
[facility] called requesting that patient's orders need to be changed. Last orders cancelled. Please change to Abdominal limited and Pelvis transvaginal.

## 2020-04-24 LAB — URINE CULTURE
MICRO NUMBER:: 10790998
Result:: NO GROWTH
SPECIMEN QUALITY:: ADEQUATE

## 2020-04-24 LAB — CULTURE INDICATED

## 2020-04-24 LAB — URINALYSIS W MICROSCOPIC + REFLEX CULTURE
Bilirubin Urine: NEGATIVE
Glucose, UA: NEGATIVE
Hgb urine dipstick: NEGATIVE
Hyaline Cast: NONE SEEN /LPF
Ketones, ur: NEGATIVE
Nitrites, Initial: NEGATIVE
Protein, ur: NEGATIVE
Specific Gravity, Urine: 1.023 (ref 1.001–1.03)
Squamous Epithelial / HPF: NONE SEEN /HPF (ref <=5)
pH: 5.5 (ref 5.0–8.0)

## 2020-04-28 ENCOUNTER — Telehealth: Payer: Self-pay | Admitting: Family Medicine

## 2020-04-28 NOTE — Telephone Encounter (Signed)
Patient is calling in stating that she is supposed to do an [place] this Friday but when calling her insurance they told her that she needs to contact [facility] about price, after calling them they told her to call our office for someone to contact her insurance about a cost.

## 2020-04-29 NOTE — Telephone Encounter (Signed)
Called and spoke with patient about message, she was informed that our office would get approvals for her upcoming scans. I advised patient to call back to [facility] and speak with someone in referrals and they can help assist her with the insurance portion that she is currently dealing with.  At their office they will have codes,etc that we will not about upcoming procedure

## 2020-04-30 ENCOUNTER — Telehealth: Payer: Self-pay | Admitting: Family Medicine

## 2020-04-30 DIAGNOSIS — R102 Pelvic and perineal pain unspecified side: Secondary | ICD-10-CM

## 2020-04-30 DIAGNOSIS — R1032 Left lower quadrant pain: Secondary | ICD-10-CM

## 2020-04-30 NOTE — Telephone Encounter (Signed)
Image center requested for updated order to add for [place] ABD LIMITED for the groin pain and US Pelvis with Transvaginal for the pelvic pain This order has been changed once.  However I do not see that reflected in the computer therefore I change them again.  If needing any future changes please change and send for me to sign.  Thank you.

## 2020-05-01 ENCOUNTER — Other Ambulatory Visit: Payer: BC Managed Care – PPO

## 2020-05-06 ENCOUNTER — Telehealth: Payer: Self-pay

## 2020-05-06 NOTE — Telephone Encounter (Signed)
Patient rec'd notification on her mychart she is due for a TET shot.  She is going to be in the area tomorrow. Wanted to  Know if Dr. Claiborne Billings would approve order for patient to get TET shot.  Please let me know in the morning so I can follow up with patient before 11:00AM, I have tentatively scheduled her an appt tomorrow 8/19

## 2020-05-07 ENCOUNTER — Ambulatory Visit
Admission: RE | Admit: 2020-05-07 | Discharge: 2020-05-07 | Disposition: A | Discharge status: Home / Self Care | Payer: BC Managed Care – PPO | Source: Ambulatory Visit | Attending: Family Medicine | Admitting: Family Medicine

## 2020-05-07 ENCOUNTER — Telehealth: Payer: Self-pay | Admitting: Family Medicine

## 2020-05-07 ENCOUNTER — Other Ambulatory Visit: Payer: Self-pay

## 2020-05-07 ENCOUNTER — Ambulatory Visit (INDEPENDENT_AMBULATORY_CARE_PROVIDER_SITE_OTHER): Payer: BC Managed Care – PPO

## 2020-05-07 DIAGNOSIS — Z23 Encounter for immunization: Secondary | ICD-10-CM

## 2020-05-07 DIAGNOSIS — R102 Pelvic and perineal pain: Secondary | ICD-10-CM

## 2020-05-07 NOTE — Telephone Encounter (Signed)
Please inform patient Her abdominal ultrasound has been resulted and is normal with the exception of a 2.6 cm left ovarian cyst.  It is recommended she have a follow-up study in 6-12 weeks to reassess.  Per report the cyst currently favors a benign cyst (not complicated or cancerous),  however since it has some septae characteristics within the cyst, which can make it a more worrisome cyst, she will need close follow-up with a gynecologist. I would recommend she make an appointment with her gynecologist for further evaluation.

## 2020-05-07 NOTE — Telephone Encounter (Signed)
Patient's last TDAP per our records was 04/27/2010.  Okay to give vaccine today at 11am?

## 2020-05-07 NOTE — Telephone Encounter (Signed)
Pt received tdap 05/07/20

## 2020-05-08 NOTE — Telephone Encounter (Signed)
Patient advised and voiced understanding.  

## 2020-05-11 ENCOUNTER — Encounter: Payer: Self-pay | Admitting: Family Medicine

## 2020-05-12 NOTE — Telephone Encounter (Signed)
Please advise, thanks.

## 2020-05-19 ENCOUNTER — Ambulatory Visit: Payer: BC Managed Care – PPO | Admitting: Clinical
